# Patient Record
Sex: Female | Born: 1965 | Race: White | Hispanic: No | Marital: Married | Smoking: Never smoker
Health system: Southern US, Community
[De-identification: ages and names within clinical notes are randomized; demographics above are authoritative.]

## PROBLEM LIST (undated history)

## (undated) DIAGNOSIS — F329 Major depressive disorder, single episode, unspecified: Secondary | ICD-10-CM

## (undated) DIAGNOSIS — F32A Depression, unspecified: Secondary | ICD-10-CM

## (undated) DIAGNOSIS — I480 Paroxysmal atrial fibrillation: Secondary | ICD-10-CM

## (undated) DIAGNOSIS — G43909 Migraine, unspecified, not intractable, without status migrainosus: Secondary | ICD-10-CM

## (undated) DIAGNOSIS — Z1371 Encounter for nonprocreative screening for genetic disease carrier status: Secondary | ICD-10-CM

## (undated) DIAGNOSIS — B009 Herpesviral infection, unspecified: Secondary | ICD-10-CM

## (undated) DIAGNOSIS — G4700 Insomnia, unspecified: Secondary | ICD-10-CM

## (undated) DIAGNOSIS — N879 Dysplasia of cervix uteri, unspecified: Secondary | ICD-10-CM

## (undated) DIAGNOSIS — F419 Anxiety disorder, unspecified: Secondary | ICD-10-CM

## (undated) DIAGNOSIS — S0300XA Dislocation of jaw, unspecified side, initial encounter: Secondary | ICD-10-CM

## (undated) DIAGNOSIS — E221 Hyperprolactinemia: Secondary | ICD-10-CM

## (undated) HISTORY — DX: Paroxysmal atrial fibrillation: I48.0

## (undated) HISTORY — DX: Hyperprolactinemia: E22.1

## (undated) HISTORY — DX: Dysplasia of cervix uteri, unspecified: N87.9

## (undated) HISTORY — PX: CERVIX LESION DESTRUCTION: SHX591

## (undated) HISTORY — PX: TRIGGER FINGER RELEASE: SHX641

## (undated) HISTORY — PX: EYE SURGERY: SHX253

## (undated) HISTORY — DX: Major depressive disorder, single episode, unspecified: F32.9

## (undated) HISTORY — DX: Insomnia, unspecified: G47.00

## (undated) HISTORY — DX: Encounter for nonprocreative screening for genetic disease carrier status: Z13.71

## (undated) HISTORY — PX: ORIF CLAVICLE FRACTURE: SUR924

## (undated) HISTORY — PX: CLAVICLE HARDWARE REMOVAL: SHX1351

## (undated) HISTORY — DX: Herpesviral infection, unspecified: B00.9

## (undated) HISTORY — DX: Depression, unspecified: F32.A

## (undated) HISTORY — PX: BREAST BIOPSY: SHX20

## (undated) HISTORY — PX: OTHER SURGICAL HISTORY: SHX169

## (undated) HISTORY — DX: Anxiety disorder, unspecified: F41.9

## (undated) HISTORY — PX: REFRACTIVE SURGERY: SHX103

## (undated) HISTORY — PX: FRACTURE SURGERY: SHX138

## (undated) HISTORY — DX: Dislocation of jaw, unspecified side, initial encounter: S03.00XA

---

## 1996-02-02 HISTORY — PX: AUGMENTATION MAMMAPLASTY: SUR837

## 1998-01-10 ENCOUNTER — Other Ambulatory Visit: Admission: RE | Admit: 1998-01-10 | Discharge: 1998-01-10 | Payer: Self-pay | Admitting: Gynecology

## 1998-02-01 HISTORY — PX: SHOULDER ARTHROSCOPY WITH ROTATOR CUFF REPAIR: SHX5685

## 1998-09-28 ENCOUNTER — Emergency Department (HOSPITAL_COMMUNITY): Admission: EM | Admit: 1998-09-28 | Discharge: 1998-09-28 | Payer: Self-pay

## 1999-02-20 ENCOUNTER — Other Ambulatory Visit: Admission: RE | Admit: 1999-02-20 | Discharge: 1999-03-05 | Payer: Self-pay | Admitting: Internal Medicine

## 1999-08-20 ENCOUNTER — Encounter: Payer: Self-pay | Admitting: Gynecology

## 1999-08-20 ENCOUNTER — Encounter: Admission: RE | Admit: 1999-08-20 | Discharge: 1999-08-20 | Payer: Self-pay | Admitting: Gynecology

## 1999-09-24 ENCOUNTER — Encounter (INDEPENDENT_AMBULATORY_CARE_PROVIDER_SITE_OTHER): Payer: Self-pay | Admitting: Specialist

## 1999-09-24 ENCOUNTER — Ambulatory Visit (HOSPITAL_COMMUNITY): Admission: RE | Admit: 1999-09-24 | Discharge: 1999-09-24 | Payer: Self-pay | Admitting: General Surgery

## 2000-03-18 ENCOUNTER — Other Ambulatory Visit: Admission: RE | Admit: 2000-03-18 | Discharge: 2000-03-18 | Payer: Self-pay | Admitting: Gynecology

## 2000-08-16 ENCOUNTER — Encounter: Payer: Self-pay | Admitting: *Deleted

## 2000-08-16 ENCOUNTER — Ambulatory Visit (HOSPITAL_COMMUNITY): Admission: RE | Admit: 2000-08-16 | Discharge: 2000-08-16 | Payer: Self-pay | Admitting: *Deleted

## 2000-11-11 ENCOUNTER — Inpatient Hospital Stay (HOSPITAL_COMMUNITY): Admission: AD | Admit: 2000-11-11 | Discharge: 2000-11-11 | Payer: Self-pay | Admitting: Gynecology

## 2000-11-11 ENCOUNTER — Encounter: Payer: Self-pay | Admitting: Gynecology

## 2000-11-22 ENCOUNTER — Inpatient Hospital Stay (HOSPITAL_COMMUNITY): Admission: AD | Admit: 2000-11-22 | Discharge: 2000-11-24 | Payer: Self-pay | Admitting: Gynecology

## 2001-01-03 ENCOUNTER — Other Ambulatory Visit: Admission: RE | Admit: 2001-01-03 | Discharge: 2001-01-03 | Payer: Self-pay | Admitting: Gynecology

## 2001-10-12 ENCOUNTER — Encounter: Payer: Self-pay | Admitting: Gynecology

## 2001-10-12 ENCOUNTER — Encounter: Admission: RE | Admit: 2001-10-12 | Discharge: 2001-10-12 | Payer: Self-pay | Admitting: Gynecology

## 2001-11-10 ENCOUNTER — Encounter (INDEPENDENT_AMBULATORY_CARE_PROVIDER_SITE_OTHER): Payer: Self-pay | Admitting: Specialist

## 2001-11-10 ENCOUNTER — Ambulatory Visit (HOSPITAL_COMMUNITY): Admission: RE | Admit: 2001-11-10 | Discharge: 2001-11-10 | Payer: Self-pay | Admitting: General Surgery

## 2002-03-23 ENCOUNTER — Other Ambulatory Visit: Admission: RE | Admit: 2002-03-23 | Discharge: 2002-03-23 | Payer: Self-pay | Admitting: Gynecology

## 2002-11-30 ENCOUNTER — Encounter: Admission: RE | Admit: 2002-11-30 | Discharge: 2002-11-30 | Payer: Self-pay | Admitting: Gynecology

## 2003-04-09 ENCOUNTER — Emergency Department (HOSPITAL_COMMUNITY): Admission: EM | Admit: 2003-04-09 | Discharge: 2003-04-09 | Payer: Self-pay | Admitting: Emergency Medicine

## 2003-06-14 ENCOUNTER — Other Ambulatory Visit: Admission: RE | Admit: 2003-06-14 | Discharge: 2003-06-14 | Payer: Self-pay | Admitting: Gynecology

## 2003-12-06 ENCOUNTER — Encounter: Admission: RE | Admit: 2003-12-06 | Discharge: 2003-12-06 | Payer: Self-pay | Admitting: Gynecology

## 2004-05-01 ENCOUNTER — Other Ambulatory Visit: Admission: RE | Admit: 2004-05-01 | Discharge: 2004-05-01 | Payer: Self-pay | Admitting: Gynecology

## 2004-07-11 ENCOUNTER — Inpatient Hospital Stay (HOSPITAL_COMMUNITY): Admission: AD | Admit: 2004-07-11 | Discharge: 2004-07-11 | Payer: Self-pay | Admitting: Gynecology

## 2004-08-15 ENCOUNTER — Inpatient Hospital Stay (HOSPITAL_COMMUNITY): Admission: AD | Admit: 2004-08-15 | Discharge: 2004-08-15 | Payer: Self-pay | Admitting: Gynecology

## 2004-09-04 ENCOUNTER — Inpatient Hospital Stay (HOSPITAL_COMMUNITY): Admission: AD | Admit: 2004-09-04 | Discharge: 2004-09-05 | Payer: Self-pay | Admitting: Gynecology

## 2004-09-11 ENCOUNTER — Inpatient Hospital Stay (HOSPITAL_COMMUNITY): Admission: AD | Admit: 2004-09-11 | Discharge: 2004-09-11 | Payer: Self-pay | Admitting: Gynecology

## 2004-10-23 ENCOUNTER — Inpatient Hospital Stay (HOSPITAL_COMMUNITY): Admission: AD | Admit: 2004-10-23 | Discharge: 2004-10-23 | Payer: Self-pay | Admitting: Gynecology

## 2004-10-29 ENCOUNTER — Inpatient Hospital Stay (HOSPITAL_COMMUNITY): Admission: AD | Admit: 2004-10-29 | Discharge: 2004-10-31 | Payer: Self-pay | Admitting: Gynecology

## 2004-10-29 ENCOUNTER — Encounter (INDEPENDENT_AMBULATORY_CARE_PROVIDER_SITE_OTHER): Payer: Self-pay | Admitting: Specialist

## 2004-11-26 ENCOUNTER — Other Ambulatory Visit: Admission: RE | Admit: 2004-11-26 | Discharge: 2004-11-26 | Payer: Self-pay | Admitting: Gynecology

## 2005-06-18 ENCOUNTER — Encounter: Admission: RE | Admit: 2005-06-18 | Discharge: 2005-06-18 | Payer: Self-pay | Admitting: Gynecology

## 2005-11-08 ENCOUNTER — Ambulatory Visit: Payer: Self-pay | Admitting: Oncology

## 2005-12-03 ENCOUNTER — Other Ambulatory Visit: Admission: RE | Admit: 2005-12-03 | Discharge: 2005-12-03 | Payer: Self-pay | Admitting: Gynecology

## 2006-02-04 ENCOUNTER — Ambulatory Visit: Payer: Self-pay | Admitting: Oncology

## 2006-03-28 ENCOUNTER — Encounter: Payer: Self-pay | Admitting: Internal Medicine

## 2006-07-15 ENCOUNTER — Encounter: Admission: RE | Admit: 2006-07-15 | Discharge: 2006-07-15 | Payer: Self-pay | Admitting: Gynecology

## 2006-09-16 ENCOUNTER — Ambulatory Visit: Payer: Self-pay | Admitting: Licensed Clinical Social Worker

## 2006-09-30 ENCOUNTER — Ambulatory Visit: Payer: Self-pay | Admitting: Licensed Clinical Social Worker

## 2006-10-25 ENCOUNTER — Emergency Department (HOSPITAL_COMMUNITY): Admission: EM | Admit: 2006-10-25 | Discharge: 2006-10-25 | Payer: Self-pay | Admitting: *Deleted

## 2006-10-28 ENCOUNTER — Ambulatory Visit (HOSPITAL_COMMUNITY): Admission: RE | Admit: 2006-10-28 | Discharge: 2006-10-28 | Payer: Self-pay | Admitting: Orthopedic Surgery

## 2006-12-09 ENCOUNTER — Other Ambulatory Visit: Admission: RE | Admit: 2006-12-09 | Discharge: 2006-12-09 | Payer: Self-pay | Admitting: Gynecology

## 2007-09-01 ENCOUNTER — Encounter: Admission: RE | Admit: 2007-09-01 | Discharge: 2007-09-01 | Payer: Self-pay | Admitting: Gynecology

## 2007-09-27 ENCOUNTER — Encounter: Admission: RE | Admit: 2007-09-27 | Discharge: 2007-09-27 | Payer: Self-pay | Admitting: Gynecology

## 2007-11-10 ENCOUNTER — Ambulatory Visit: Payer: Self-pay | Admitting: Gynecology

## 2008-01-09 ENCOUNTER — Ambulatory Visit (HOSPITAL_COMMUNITY): Admission: RE | Admit: 2008-01-09 | Discharge: 2008-01-09 | Payer: Self-pay | Admitting: Gynecology

## 2008-01-09 ENCOUNTER — Ambulatory Visit: Payer: Self-pay | Admitting: Gynecology

## 2008-01-09 ENCOUNTER — Other Ambulatory Visit: Admission: RE | Admit: 2008-01-09 | Discharge: 2008-01-09 | Payer: Self-pay | Admitting: Gynecology

## 2008-01-09 ENCOUNTER — Encounter: Payer: Self-pay | Admitting: Gynecology

## 2008-02-01 ENCOUNTER — Ambulatory Visit: Payer: Self-pay | Admitting: Gynecology

## 2008-02-23 ENCOUNTER — Encounter: Admission: RE | Admit: 2008-02-23 | Discharge: 2008-02-23 | Payer: Self-pay | Admitting: Gastroenterology

## 2008-03-06 ENCOUNTER — Ambulatory Visit (HOSPITAL_COMMUNITY): Admission: RE | Admit: 2008-03-06 | Discharge: 2008-03-06 | Payer: Self-pay | Admitting: Gastroenterology

## 2008-09-19 ENCOUNTER — Encounter: Admission: RE | Admit: 2008-09-19 | Discharge: 2008-09-19 | Payer: Self-pay | Admitting: Gynecology

## 2008-11-08 ENCOUNTER — Encounter (INDEPENDENT_AMBULATORY_CARE_PROVIDER_SITE_OTHER): Payer: Self-pay | Admitting: *Deleted

## 2008-11-15 ENCOUNTER — Ambulatory Visit: Payer: Self-pay | Admitting: Family Medicine

## 2008-11-15 DIAGNOSIS — Z860100 Personal history of colon polyps, unspecified: Secondary | ICD-10-CM | POA: Insufficient documentation

## 2008-11-15 DIAGNOSIS — R5383 Other fatigue: Secondary | ICD-10-CM | POA: Insufficient documentation

## 2008-11-15 DIAGNOSIS — Z862 Personal history of diseases of the blood and blood-forming organs and certain disorders involving the immune mechanism: Secondary | ICD-10-CM | POA: Insufficient documentation

## 2008-11-15 DIAGNOSIS — F3289 Other specified depressive episodes: Secondary | ICD-10-CM | POA: Insufficient documentation

## 2008-11-15 DIAGNOSIS — R519 Headache, unspecified: Secondary | ICD-10-CM | POA: Insufficient documentation

## 2008-11-15 DIAGNOSIS — J309 Allergic rhinitis, unspecified: Secondary | ICD-10-CM | POA: Insufficient documentation

## 2008-11-15 DIAGNOSIS — R51 Headache: Secondary | ICD-10-CM | POA: Insufficient documentation

## 2008-11-15 DIAGNOSIS — R5381 Other malaise: Secondary | ICD-10-CM | POA: Insufficient documentation

## 2008-11-15 DIAGNOSIS — F329 Major depressive disorder, single episode, unspecified: Secondary | ICD-10-CM | POA: Insufficient documentation

## 2008-11-19 LAB — CONVERTED CEMR LAB
BUN: 10 mg/dL (ref 6–23)
Basophils Absolute: 0 10*3/uL (ref 0.0–0.1)
Basophils Relative: 0 % (ref 0–1)
CO2: 20 meq/L (ref 19–32)
Calcium: 9.6 mg/dL (ref 8.4–10.5)
Chloride: 105 meq/L (ref 96–112)
Creatinine, Ser: 0.9 mg/dL (ref 0.40–1.20)
Eosinophils Absolute: 0.1 10*3/uL (ref 0.0–0.7)
Eosinophils Relative: 2 % (ref 0–5)
Glucose, Bld: 85 mg/dL (ref 70–99)
HCT: 38.7 % (ref 36.0–46.0)
Hemoglobin: 12.7 g/dL (ref 12.0–15.0)
Lymphocytes Relative: 17 % (ref 12–46)
Lymphs Abs: 1.2 10*3/uL (ref 0.7–4.0)
MCHC: 32.8 g/dL (ref 30.0–36.0)
MCV: 91.5 fL (ref 78.0–100.0)
Monocytes Absolute: 0.7 10*3/uL (ref 0.1–1.0)
Monocytes Relative: 10 % (ref 3–12)
Neutro Abs: 5.3 10*3/uL (ref 1.7–7.7)
Neutrophils Relative %: 72 % (ref 43–77)
Platelets: 253 10*3/uL (ref 150–400)
Potassium: 4.4 meq/L (ref 3.5–5.3)
Prolactin: 21.4 ng/mL
RBC: 4.23 M/uL (ref 3.87–5.11)
RDW: 12.5 % (ref 11.5–15.5)
Sodium: 143 meq/L (ref 135–145)
TSH: 3.193 microintl units/mL (ref 0.350–4.500)
WBC: 7.4 10*3/uL (ref 4.0–10.5)

## 2008-11-29 ENCOUNTER — Encounter: Admission: RE | Admit: 2008-11-29 | Discharge: 2008-11-29 | Payer: Self-pay | Admitting: Family Medicine

## 2008-12-02 ENCOUNTER — Encounter (INDEPENDENT_AMBULATORY_CARE_PROVIDER_SITE_OTHER): Payer: Self-pay | Admitting: *Deleted

## 2008-12-02 ENCOUNTER — Telehealth (INDEPENDENT_AMBULATORY_CARE_PROVIDER_SITE_OTHER): Payer: Self-pay | Admitting: *Deleted

## 2009-02-01 HISTORY — PX: LAPAROSCOPIC CHOLECYSTECTOMY: SUR755

## 2009-04-25 ENCOUNTER — Ambulatory Visit: Payer: Self-pay | Admitting: Gynecology

## 2009-04-25 ENCOUNTER — Other Ambulatory Visit: Admission: RE | Admit: 2009-04-25 | Discharge: 2009-04-25 | Payer: Self-pay | Admitting: Gynecology

## 2009-10-31 ENCOUNTER — Encounter: Admission: RE | Admit: 2009-10-31 | Discharge: 2009-10-31 | Payer: Self-pay | Admitting: Gynecology

## 2010-03-05 NOTE — Letter (Signed)
Summary: New Patient Letter  [facility] [place]  [address] [place], [place] [address]   Phone: [phone]  Fax: [phone]       11/08/2008 MRN: [id number]  Elfreda LACHIUSA [address] [place], [place]  [address]  Dear Ms. LACHIUSA,   Welcome to [facility] and thank you for choosing [place] as your Primary Care Providers. Enclosed you will find information about our practice that we hope you find helpful. We have also enclosed forms to be filled out prior to your visit. This will provide [place] with the necessary information and facilitate your being seen in a timely manner. If you have any questions, please call us at:    [phone]      and we will be happy to assist you. We look forward to seeing you at your scheduled appointment time.  Appointment   OCTOBER [phone]            with Dr.    Beverely Low             Sincerely,  Primary Health Care Team  Please arrive 15 minutes early for your first appointment and bring your insurance card. Co-pay is required at the time of your visit.  *****Please call the office if you are not able to keep this appointment. There is a charge of $50.00 if any appointment is not cancelled or rescheduled within 24 hours.

## 2010-03-05 NOTE — Progress Notes (Signed)
  Phone Note Other Incoming   Caller: ALIDA Summary of Call: LEFT MSG FOR PT TO CALL RE: LAB  Initial call taken by: Kandice Hams,  December 02, 2008 9:42 AM  Follow-up for Phone Call        Pt called back informed of lab result, she is still having some nagging mild to moderate HA, within past week. Pt would like the referral to Dr Clarisse Gouge for management of HA, REFERRAL GIVEN  Follow-up by: Kandice Hams,  December 02, 2008 12:41 PM

## 2010-03-05 NOTE — Assessment & Plan Note (Signed)
Summary: NEW PT/AETNA/NS/KDC   Vital Signs:  Patient profile:   45 year old female Height:      63.75 inches Weight:      146.2 pounds BMI:     25.38 Pulse rate:   66 / minute BP sitting:   114 / 68  (right arm)  Vitals Entered By: Doristine Devoid (November 15, 2008 3:39 PM) CC: new est- frequent HA   History of Present Illness: 45 yo woman here today to establish care.  GYN- Lily Peer, [address]  1) HAs-  pt reports waking up w/ pounding HA over L eye for last 3-4 weeks.  no associated N/V, dizziness, visual changes.  hx of migraines- but not as severe as those.  pain described as a throbbing.  no radiation of pain, remains frontal.  no incontinence.  no focal weakness.  has hx of seasonal allergies- taking claritin as needed.  pt reports increased thirst, excessively dry skin- concerned about thyroid.  years ago had elevated prolactin level- was on Parlodel but never had a CT scan.  2) fatigue- pt reports feeling the need to sleep 'all the time'.  amount of sleep at night has not changed but pt feeling overwhelming fatigue.  has concerns about thyroid given dry skin and increased thirst.  no family hx of thyroid dysfxn.  3) allergies- seasonal, not currently on regular meds.  taking claritin as needed.  Preventive Screening-Counseling & Management  Alcohol-Tobacco     Alcohol drinks/day: <1     Smoking Status: never      Sexual History:  currently monogamous.        Drug Use:  never.    Current Medications (verified): 1)  Paroxetine Hcl 25 Mg Xr24h-Tab (Paroxetine Hcl) .... Take One Tablet Daily  Allergies (verified): 1)  ! Sulfa  Past History:  Past Medical History: Depression MIgraines Allergic rhinitis Colonic polyps, hx of  Past Surgical History: R collar bone pin replacement and removed R rotator cuff Breast augmentation Colonoscopy/endoscopy-2010- Cholecystectomy  Past History:  Care Management: Gastroenterology:Dr. [address]oreta [address] Gynecology:Dr. Lily Peer   Family History: CAD-father HTN-father,grandparents DM-no STROKE-grandparents COLON CA-no BREAST CA-mother DX: 31 deceased 34  Social History: married, 2 children (02, 06) dental hygeinist dog, 2 [place] pigsSmoking Status:  never Sexual History:  currently monogamous Drug Use:  never  Review of Systems General:  Complains of fatigue; denies chills, fever, loss of appetite, malaise, sleep disorder, sweats, weakness, and weight loss. Eyes:  Denies blurring, double vision, and itching. ENT:  Complains of nasal congestion and postnasal drainage. CV:  Complains of weight gain. GI:  Denies abdominal pain, nausea, and vomiting. Derm:  Complains of dryness; denies hair loss. Neuro:  Complains of headaches; denies numbness, poor balance, seizures, tingling, and tremors. Endo:  Complains of excessive thirst.  Physical Exam  General:  Well-developed,well-nourished,in no acute distress; alert,appropriate and cooperative throughout examination Head:  Normocephalic and atraumatic without obvious abnormalities. No apparent alopecia or balding. Eyes:  No corneal or conjunctival inflammation noted. EOMI. Perrla. Funduscopic exam benign, without hemorrhages, exudates or papilledema. Vision grossly normal. Ears:  External ear exam shows no significant lesions or deformities.  Otoscopic examination reveals clear canals, tympanic membranes are intact bilaterally without bulging, retraction, inflammation or discharge. Hearing is grossly normal bilaterally. Nose:  + congestion, mildly edematous turbinates Mouth:  + PND Neck:  No deformities, masses, or tenderness noted. Lungs:  Normal respiratory effort, chest expands symmetrically. Lungs are clear to auscultation, no crackles or wheezes. Heart:  Normal rate and regular  rhythm. S1 and S2 normal without gallop, murmur, click, rub or other extra sounds. Pulses:  +2 carotid, radial, DP Extremities:  no C/C/E Neurologic:  No cranial nerve deficits noted.  Station and gait are normal. Plantar reflexes are down-going bilaterally. DTRs are symmetrical throughout. Sensory, motor and coordinative functions appear intact.  (-) Romberg, tandem gait intact Skin:  Intact without suspicious lesions or rashes   Impression & Recommendations:  Problem # 1:  HEADACHE (ICD-784.0) Assessment New pt waking w/ daily HAs that improve as the day goes on.  hx of elevated prolactin level but has never had imaging.  diff dx includes pituitary adenoma, sinus HAs, untreated allergies, migraine variant.  will image head to assess pituitary and for other potential masses.  treat allergies on more regular basis.  reviewed supportive care and red flags that should prompt return.  Pt expresses understanding and is in agreement w/ this plan. Orders: Radiology Referral (Radiology)  Problem # 2:  FATIGUE (ICD-780.79) Assessment: New pt having combo of fatigue, dry skin, increased thirst.  normal thyroid on exam but will check labs. Orders: Venipuncture ([address]1478)  Problem # 3:  ALLERGIC RHINITIS (ICD-477.9) Assessment: New encouraged pt to take OTC antihistamine daily to see if HA sxs improve.  will follow.  Complete Medication List: 1)  Paroxetine Hcl 25 Mg Xr24h-tab (Paroxetine hcl) .... Take one tablet daily  Patient Instructions: 1)  Follow up with me in 2 weeks to f/u headaches 2)  We'll notify you of your lab results 3)  Someone will call you with your MRI appt 4)  Take Claritin or Zyrtec daily for allergies 5)  Tylenol/Ibuprofen as needed. 6)  If your symptoms suddenly worsen or you have any other questions or concerns- please call or go to the ER 7)  Hang in there!!

## 2010-03-05 NOTE — Letter (Signed)
Summary: Primary Care Consult Scheduled Letter  [facility] [place]  [address] [place], [place] [address]   Phone: [phone]  Fax: [phone]      12/02/2008 MRN: [id number]  Jenna Cortez [address] [place], [place]  [address]    Dear Ms. Cortez,    We have scheduled an appointment for you.  At the recommendation of Dr. Neena Rhymes, we have scheduled you a consult with Dr. Clarisse Gouge with [facility] on 12-20-08 arrive 11:00am.  Their address is [address], [address], [place] [address]. The office phone number is [phone].  If this appointment day and time is not convenient for you, please feel free to call the office of the doctor you are being referred to at the number listed above and reschedule the appointment.    It is important for you to keep your scheduled appointments. We are here to make sure you are given good patient care.   Thank you,    Renee, Patient Care Coordinator [facility] [place]    **YOU WILL NEED TO TAKE A COPY OF YOUR MRI ON CD TO THIS APPOINTMENT**

## 2010-03-05 NOTE — Letter (Signed)
Summary: No Show for New Patient Appointment  No Show for New Patient Appointment   Imported By: Maryln Gottron 07/07/2009 15:08:32  _____________________________________________________________________  External Attachment:    Type:   Image     Comment:   External Document

## 2010-05-01 ENCOUNTER — Encounter: Payer: Self-pay | Admitting: Gynecology

## 2010-05-18 ENCOUNTER — Encounter: Payer: Self-pay | Admitting: Gynecology

## 2010-06-05 ENCOUNTER — Encounter: Payer: Self-pay | Admitting: Gynecology
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
## 2010-06-19 ENCOUNTER — Encounter: Payer: Self-pay | Admitting: Gynecology

## 2010-06-19 NOTE — Op Note (Signed)
[facility]  Patient:    Jenna Cortez, Jenna Cortez                     MRN: [id number] Proc. Date: 09/24/99 Adm. Date:  [id number] Disc. Date: [id number] Attending:  Carson Myrtle                           Operative Report  PREOPERATIVE DIAGNOSES:  Mass right breast.  POSTOPERATIVE DIAGNOSES:  Mass right breast.  PROCEDURE:  Excision mass right breast.  SURGEON:  Kendrick Ranch, M.D.  ANESTHESIA:  Local standby.  INDICATIONS FOR PROCEDURE:  This young woman is 80, has a strong family history of breast cancer and though she has had implants this is a new mass in the right mass. It is very small in the upper outer quadrant of the right breast. She is very nervous and would prefer to have it removed and I agree.  DESCRIPTION OF PROCEDURE:  The patient was brought to the operating room, IV started, sedation given. I had localized the small palpable mass in the right breast upper outer quadrant. It is clearly outside the implant inside the skin. A 0.25% Marcaine with epinephrine was used for local anesthesia after the field was prepped and draped. A tiny incision was made. The mass was identified and grasped and drawn out of the field and sharply dissected with scissors away from the surrounding breast tissue. The capsule of the implant was never seen. There were no complications, no bleeding. The wound was closed with 5-0 monocryl. Steri-Strips applied. Counts correct. Specimen to the lab in formalin. The patient tolerated it well. Instructions given including Darvocet-N #24 and she will be followed as an outpatient. DD:  09/24/99 TD:  09/26/99 Job: [id number]

## 2010-06-19 NOTE — H&P (Signed)
[facility]  Patient:    Jenna Cortez, Jenna Cortez Visit Number: [id number] MRN: [id number]          Service Type: OBS Location: [facility] Attending Physician:  Tonye Royalty Dictated by:   Gaetano Hawthorne. Lily Peer, M.D. Admit Date:  11/11/2000 Discharge Date: 11/11/2000                           History and Physical  CHIEF COMPLAINT:              Bloody show and contractions and rupture of membranes.  HISTORY:                      The patient is a 45 year old gravida 1, para 0, with last menstrual period of February 27, 2000, estimated date of confinement December 03, 2000, currently 38-1/2 weeks estimated gestational age.  The patient presented to [facility] early this morning complaining of contractions which started yesterday and have intensified and also having a bloody show and questionable rupture of membranes.  She was found to be grossly ruptured, bloody show was evident, her cervix was fingertip and she has had a history of cryotherapy in the past and the cervix was dilated on examination with digitalization to approximately 3 cm, almost complete effacement and a -2 station.  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  There was a question whether back on October 14th she had an outbreak or not and her culture was negative and none reported since then.  ALLERGIES:                    SULFA and CODEINE.  At one time, she thought she was allergic to latex but that has been discarded.  PAST SURGICAL HISTORY:        She has had shoulder surgery in the past.  She has also had breast augmentation and right breast lump removed back in 2001.  PHYSICAL EXAMINATION:  VITAL SIGNS:                  Blood pressure 126/74, respirations 20, pulse 92, temperature 97.8.  HEENT:                         Unremarkable.  NECK:                         Supple.  Trachea midline.  No carotid bruits. No thyromegaly.  LUNGS:                        Clear to auscultation without rhonchi or wheezes.  HEART:                        Regular rate and rhythm.  No murmurs or gallops.  BREASTS:                      Exam not done.  ABDOMEN:                      Gravid uterus, vertex presentation by [employer].  PELVIC:  Cervix 3 cm, complete effacement, -2 station, gross rupture of membranes, bloody show.  EXTREMITIES:                  DTRs 1+.  Negative clonus.  Trace edema.  PRENATAL LABORATORY DATA:     Blood type O-positive, negative antibody screen. VDRL was nonreactive.  Rubella immune.  Hepatitis B surface antigen and HIV were negative.  Pap smear was normal.  Alpha-fetoprotein was normal.  Diabetes screen was normal.  GBS culture was negative.  ASSESSMENT:                   [age]-year-old gravida 1, para 0 at 38-1/2 weeks estimated gestational age with a bloody show, now advanced cervical dilatation, 3 cm, complete effacement, -2 station with gross rupture of membranes, contracting every 3 to 7 minutes, with a reassuring fetal heart rate tracing.  Group B streptococcus culture was negative.  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  PLAN:                         To admit patient to labor and delivery and augment with Pitocin in the event of protracted labor and have an epidural on board and then proceed with intrauterine pressure catheter and fetal scalp electrode.  Dictated by:   Gaetano Hawthorne Lily Peer, M.D. Attending Physician:  Tonye Royalty DD:  11/22/00 TD:  11/22/00 Job: [id number]

## 2010-06-19 NOTE — Discharge Summary (Signed)
[facility]  Patient:    Jenna Cortez, Jenna Cortez Visit Number: [id number] MRN: [id number]          Service Type: OBS Location: 910A 9129 01 Attending Physician:  Tonye Royalty Dictated by:   Antony Contras, [facility] Admit Date:  11/22/2000 Discharge Date: 11/24/2000                             Discharge Summary  DISCHARGE DIAGNOSES:          1. Intrauterine pregnancy at term.                               2. Spontaneous onset of labor.  PROCEDURES:                   Normal spontaneous vaginal delivery of viable infant over intact perineum.  HISTORY OF PRESENT ILLNESS:   The patient is a 45 year old primigravida with an LMP of February 27, 2000, [facility] of December 03, 2000.  Prenatal risk factors include a history of depression, HSV, and cryosurgery of the cervix.  PRENATAL LABORATORY DATA:     Blood type O positive, antibody screen negative. RPR, HBsAg, HIV nonreactive.  Rubella immune.  HOSPITAL COURSE:              The patient was admitted on November 22, 2000, with spontaneous onset of labor.  The cervix on admission was 3 cm, completely effaced, -2 station.  The patient also did experience rupture of membranes. Labor progressed to complete dilatation.  She delivered an Apgars of 9 and 30 female infant weighing 5 pounds 12 ounces.  She did experience superficial bilateral labial tears, which required no suturing.  Postpartum course:  The patient remained afebrile, had no difficulty voiding, and was able to be discharged in satisfactory condition on her second postpartum day.  CBC: Hematocrit 31.8, hemoglobin 11.5, WBC 15.2, platelets 222.  FOLLOW-UP:                    In six weeks.  MEDICATIONS:                  Continue with prenatal vitamins and iron. Motrin or Tylox for pain. Dictated by:   Antony Contras, [facility] Attending Physician:  Tonye Royalty DD:  12/09/00 TD:  12/11/00 Job: [id number]

## 2010-06-19 NOTE — Op Note (Signed)
   NAME:  Jenna Cortez, Jenna Cortez                      ACCOUNT NO.:  [id number]   MEDICAL RECORD NO.:  [id number]                   PATIENT TYPE:  AMB   LOCATION:  DAY                                  FACILITY:  [facility]   PHYSICIAN:  Timothy E. Earlene Plater, M.D.              DATE OF BIRTH:  [birth year]   DATE OF PROCEDURE:  11/10/2001  DATE OF DISCHARGE:                                 OPERATIVE REPORT   PREOPERATIVE DIAGNOSIS:  Left breast mass.   POSTOPERATIVE DIAGNOSIS:  Left breast mass.   PROCEDURE:  Excisional biopsy of mass, left breast.   SURGEON:  Timothy E. Earlene Plater, M.D.   ANESTHESIA:  Local standby.   INDICATION FOR PROCEDURE:  The patient is healthy, 2, gravida 1.  She has  had saline breast implants.  She has a strong family history with her mother  with breast cancer at a young age.  Because of a palpable, tender mass in  the left breast, she elects to proceed with excisional biopsy.  Mammography  is noncontributory.   DESCRIPTION OF PROCEDURE:  The patient was seen and evaluated by anesthesia.  The exact site of the palpable mass was marked by her and me.  She was taken  to the operating room, placed supine, and IV sedation given.  The left  breast was prepped and draped in the usual fashion.  Marcaine 0.25% with  epinephrine was used for local anesthesia.  A tiny incision was made over  the marked area of the left breast upper inner quadrant, the subcutaneous  tissue dissected, and the nodular tissue was removed.  This did not appear  to be a fibroadenoma and may have been fibroglandular tissue only.  I did  excise down to the pectoral fascia.  Bimanual exam revealed no other  palpable areas.  Cautery was used, and the wound was dry.  It was  approximated in layers with 4-0 Monocryl.  Steri-Strips applied.  Counts  correct.  She tolerated it well and was removed to the recovery room in good  condition.   Written and verbal instructions were given to the patient.  She will be  seen  and followed as an outpatient.                                               Timothy E. Earlene Plater, M.D.    TED/MEDQ  D:  11/10/2001  T:  11/10/2001  Job:  [id number]

## 2010-06-19 NOTE — Consult Note (Signed)
[facility]  Patient:    Jenna Cortez, CROOK Visit Number: [id number] MRN: [id number]          Service Type: OBS Location: [facility] Attending Physician:  Tonye Royalty Dictated by:   Gaetano Hawthorne. Lily Peer, M.D. Admit Date:  11/11/2000                            Consultation Report  HISTORY OF PRESENT ILLNESS:   The patient is a 45 year old gravida 1, para 0 at 36-1/2 weeks estimated gestational age, who presented to the emergency room today with complaints of vaginal bleeding.  The patient was seen in the office earlier today and had bleeding and she had spontaneous rupture of membranes and was Nitrazine and fern negative.  Her cervix was somewhat friable and a wet prep was done with a speculum.  Some bleeding had occurred and pressure was applied and stopped on the ectocervix, and she was sent home with instructions, she called and I instructed her to come to the emergency room tonight for evaluation.  She had an ultrasound which demonstrated the fetus to be in a vertex presentation, AFI was 12.1, placenta was intact with no evidence of abruption.  A sterile speculum exam demonstrated just old blood in the vaginal vault and the cervix was observed and there was no active bleeding coming from the os even after a gauze was left in place.  Her cervix was fingertip, 50-70% effaced and ballotable.  A fetal heart rate tracing demonstrated a reactive pattern with no evidence of contractions.  Her vital signs were 139/44, pulse 97, respiration 20, temperature 97.8.  Her blood type is O positive.  DISCHARGE INSTRUCTIONS:  She was given instructions that with any further bleeding or contractions to return to the emergency room or not she is scheduled to be seen in the office next week. Dictated by:   Gaetano Hawthorne Lily Peer, M.D. Attending Physician:  Tonye Royalty DD:  11/11/00 TD:  11/12/00 Job: [id number]

## 2010-07-17 ENCOUNTER — Other Ambulatory Visit (HOSPITAL_COMMUNITY)
Admission: RE | Admit: 2010-07-17 | Discharge: 2010-07-17 | Disposition: A | Discharge status: Home / Self Care | Payer: Managed Care, Other (non HMO) | Source: Ambulatory Visit | Attending: Gynecology | Admitting: Gynecology

## 2010-07-17 ENCOUNTER — Other Ambulatory Visit: Payer: Self-pay | Admitting: Gynecology

## 2010-07-17 ENCOUNTER — Encounter (INDEPENDENT_AMBULATORY_CARE_PROVIDER_SITE_OTHER): Payer: Managed Care, Other (non HMO) | Admitting: Gynecology

## 2010-07-17 DIAGNOSIS — Z13220 Encounter for screening for lipoid disorders: Secondary | ICD-10-CM

## 2010-07-17 DIAGNOSIS — Z01419 Encounter for gynecological examination (general) (routine) without abnormal findings: Secondary | ICD-10-CM

## 2010-07-17 DIAGNOSIS — F329 Major depressive disorder, single episode, unspecified: Secondary | ICD-10-CM

## 2010-07-17 DIAGNOSIS — Z124 Encounter for screening for malignant neoplasm of cervix: Secondary | ICD-10-CM | POA: Insufficient documentation

## 2010-07-17 DIAGNOSIS — Z833 Family history of diabetes mellitus: Secondary | ICD-10-CM

## 2010-07-17 DIAGNOSIS — R634 Abnormal weight loss: Secondary | ICD-10-CM

## 2010-07-17 DIAGNOSIS — F3289 Other specified depressive episodes: Secondary | ICD-10-CM

## 2010-10-12 ENCOUNTER — Encounter: Payer: Self-pay | Admitting: Women's Health

## 2010-10-12 ENCOUNTER — Ambulatory Visit (INDEPENDENT_AMBULATORY_CARE_PROVIDER_SITE_OTHER): Payer: Managed Care, Other (non HMO) | Admitting: Women's Health

## 2010-10-12 DIAGNOSIS — R5383 Other fatigue: Secondary | ICD-10-CM

## 2010-10-12 DIAGNOSIS — N925 Other specified irregular menstruation: Secondary | ICD-10-CM

## 2010-10-12 DIAGNOSIS — Z113 Encounter for screening for infections with a predominantly sexual mode of transmission: Secondary | ICD-10-CM

## 2010-10-12 DIAGNOSIS — B373 Candidiasis of vulva and vagina: Secondary | ICD-10-CM

## 2010-10-12 DIAGNOSIS — N938 Other specified abnormal uterine and vaginal bleeding: Secondary | ICD-10-CM

## 2010-10-12 DIAGNOSIS — N949 Unspecified condition associated with female genital organs and menstrual cycle: Secondary | ICD-10-CM

## 2010-10-12 DIAGNOSIS — B3731 Acute candidiasis of vulva and vagina: Secondary | ICD-10-CM

## 2010-10-12 DIAGNOSIS — R5381 Other malaise: Secondary | ICD-10-CM

## 2010-10-12 MED ORDER — FLUCONAZOLE 150 MG PO TABS: 150.0000 mg | ORAL_TABLET | Freq: Once | ORAL | Status: AC

## 2010-10-12 NOTE — Progress Notes (Signed)
  Presents with a complaint of spotting after intercourse for about a month she does have a new partner. She is on her cycle, it is at normal time. She is separated from her husband who had been unfaithful. She's using condoms for contraception. Denies any abdominal pain, discharge with odor or fever. Abdomen is soft nontender external genitalia is within normal limits, speculum exam moderate amount of menses type blood was noted, wet prep is positive for yeast, GC Chlamydia culture was taken and is pending. Bimanual no CMT no adnexal fullness or tenderness.  DUB unknown origin  Will check a call, TSH, GC Chlamydia, HIV, hep B and C., RPR. Contraception was reviewed, she think she would like to try IUD. Did review slight risk for perforation, hemorrhage, infection. Will have Dr. Lily Peer place with a period. Did review if the spotting would continue will do a sonohysterogram with biopsy. Will treat yeast with Diflucan 150 by mouth x1 dose. Continue condoms, plan B emergency contraception was reviewed.

## 2010-10-13 LAB — HIV ANTIBODY (ROUTINE TESTING W REFLEX): HIV: NONREACTIVE

## 2010-10-13 LAB — HEPATITIS C ANTIBODY: HCV Ab: NEGATIVE

## 2010-10-13 LAB — RPR

## 2010-10-13 LAB — HEPATITIS B SURFACE ANTIGEN: Hepatitis B Surface Ag: NEGATIVE

## 2010-11-12 LAB — CBC
HCT: 37.3
Hemoglobin: 12.4
MCHC: 33.4
MCV: 91.2
Platelets: 255
RBC: 4.09
RDW: 12.6
WBC: 7.3

## 2010-11-12 LAB — HCG, SERUM, QUALITATIVE: Preg, Serum: NEGATIVE

## 2010-11-17 ENCOUNTER — Other Ambulatory Visit: Payer: Self-pay | Admitting: Gynecology

## 2010-11-17 DIAGNOSIS — Z1231 Encounter for screening mammogram for malignant neoplasm of breast: Secondary | ICD-10-CM

## 2010-12-11 ENCOUNTER — Ambulatory Visit: Payer: Managed Care, Other (non HMO)

## 2010-12-25 ENCOUNTER — Ambulatory Visit: Payer: Managed Care, Other (non HMO)

## 2011-01-22 ENCOUNTER — Ambulatory Visit: Payer: Managed Care, Other (non HMO)

## 2011-01-29 ENCOUNTER — Ambulatory Visit: Payer: Managed Care, Other (non HMO)

## 2011-03-03 ENCOUNTER — Other Ambulatory Visit: Payer: Self-pay | Admitting: *Deleted

## 2011-03-03 DIAGNOSIS — F419 Anxiety disorder, unspecified: Secondary | ICD-10-CM

## 2011-03-03 MED ORDER — ALPRAZOLAM 0.5 MG PO TABS: ORAL_TABLET | ORAL | Status: DC

## 2011-03-03 NOTE — Telephone Encounter (Signed)
rx called in

## 2011-03-11 ENCOUNTER — Ambulatory Visit
Admission: RE | Admit: 2011-03-11 | Discharge: 2011-03-11 | Disposition: A | Discharge status: Home / Self Care | Payer: Managed Care, Other (non HMO) | Source: Ambulatory Visit | Attending: Gynecology | Admitting: Gynecology

## 2011-03-11 DIAGNOSIS — Z1231 Encounter for screening mammogram for malignant neoplasm of breast: Secondary | ICD-10-CM

## 2011-03-12 ENCOUNTER — Ambulatory Visit: Payer: Managed Care, Other (non HMO)

## 2011-03-24 ENCOUNTER — Other Ambulatory Visit: Payer: Self-pay

## 2011-03-24 MED ORDER — ALPRAZOLAM 0.5 MG PO TABS: ORAL_TABLET | ORAL | Status: DC

## 2011-03-24 NOTE — Telephone Encounter (Signed)
HARD COPY CHART WILL BE IN YOUR BOX.

## 2011-03-24 NOTE — Telephone Encounter (Signed)
Rx phoned into phamacy.

## 2011-03-24 NOTE — Telephone Encounter (Signed)
Patient to be reminded she needs her annual exam in June of this year.

## 2011-03-31 ENCOUNTER — Encounter (HOSPITAL_COMMUNITY): Payer: Self-pay | Admitting: Emergency Medicine

## 2011-03-31 ENCOUNTER — Inpatient Hospital Stay (HOSPITAL_COMMUNITY)
Admission: EM | Admit: 2011-03-31 | Discharge: 2011-04-01 | DRG: 309 | Disposition: A | Discharge status: Home / Self Care | Payer: Managed Care, Other (non HMO) | Attending: Internal Medicine | Admitting: Internal Medicine

## 2011-03-31 ENCOUNTER — Emergency Department (HOSPITAL_COMMUNITY): Payer: Managed Care, Other (non HMO)

## 2011-03-31 DIAGNOSIS — F329 Major depressive disorder, single episode, unspecified: Secondary | ICD-10-CM | POA: Diagnosis present

## 2011-03-31 DIAGNOSIS — I4891 Unspecified atrial fibrillation: Secondary | ICD-10-CM

## 2011-03-31 DIAGNOSIS — E876 Hypokalemia: Secondary | ICD-10-CM | POA: Diagnosis not present

## 2011-03-31 DIAGNOSIS — E229 Hyperfunction of pituitary gland, unspecified: Secondary | ICD-10-CM | POA: Diagnosis present

## 2011-03-31 DIAGNOSIS — F3289 Other specified depressive episodes: Secondary | ICD-10-CM | POA: Diagnosis present

## 2011-03-31 DIAGNOSIS — M069 Rheumatoid arthritis, unspecified: Secondary | ICD-10-CM | POA: Diagnosis present

## 2011-03-31 DIAGNOSIS — B009 Herpesviral infection, unspecified: Secondary | ICD-10-CM | POA: Diagnosis present

## 2011-03-31 DIAGNOSIS — M2669 Other specified disorders of temporomandibular joint: Secondary | ICD-10-CM | POA: Diagnosis present

## 2011-03-31 LAB — DIFFERENTIAL
Basophils Absolute: 0 10*3/uL (ref 0.0–0.1)
Basophils Relative: 0 % (ref 0–1)
Eosinophils Absolute: 0.1 10*3/uL (ref 0.0–0.7)
Eosinophils Relative: 2 % (ref 0–5)
Lymphocytes Relative: 26 % (ref 12–46)
Lymphs Abs: 2 10*3/uL (ref 0.7–4.0)
Monocytes Absolute: 0.8 10*3/uL (ref 0.1–1.0)
Monocytes Relative: 10 % (ref 3–12)
Neutro Abs: 4.9 10*3/uL (ref 1.7–7.7)
Neutrophils Relative %: 63 % (ref 43–77)

## 2011-03-31 LAB — CBC
HCT: 37.8 % (ref 36.0–46.0)
Hemoglobin: 13.2 g/dL (ref 12.0–15.0)
MCH: 30.8 pg (ref 26.0–34.0)
MCHC: 34.9 g/dL (ref 30.0–36.0)
MCV: 88.1 fL (ref 78.0–100.0)
Platelets: 283 10*3/uL (ref 150–400)
RBC: 4.29 MIL/uL (ref 3.87–5.11)
RDW: 12.4 % (ref 11.5–15.5)
WBC: 7.8 10*3/uL (ref 4.0–10.5)

## 2011-03-31 LAB — POCT PREGNANCY, URINE: Preg Test, Ur: NEGATIVE

## 2011-03-31 LAB — BASIC METABOLIC PANEL
BUN: 14 mg/dL (ref 6–23)
CO2: 22 mEq/L (ref 19–32)
Calcium: 9.7 mg/dL (ref 8.4–10.5)
Chloride: 106 mEq/L (ref 96–112)
Creatinine, Ser: 0.82 mg/dL (ref 0.50–1.10)
GFR calc Af Amer: >90 mL/min (ref >90)
GFR calc non Af Amer: 86 mL/min — ABNORMAL LOW (ref >90)
Glucose, Bld: 85 mg/dL (ref 70–99)
Potassium: 3.4 mEq/L — ABNORMAL LOW (ref 3.5–5.1)
Sodium: 138 mEq/L (ref 135–145)

## 2011-03-31 MED ORDER — SODIUM CHLORIDE 0.9 % IV BOLUS (SEPSIS)
500.0000 mL | Freq: Once | INTRAVENOUS | Status: AC
  Administered 2011-03-31: 20:00:00 via INTRAVENOUS

## 2011-03-31 MED ORDER — DILTIAZEM HCL 100 MG IV SOLR
5.0000 mg/h | INTRAVENOUS | Status: AC
  Administered 2011-03-31: 5 mg/h via INTRAVENOUS
  Filled 2011-03-31: qty 100

## 2011-03-31 MED ORDER — ASPIRIN 81 MG PO CHEW: 324.0000 mg | CHEWABLE_TABLET | Freq: Once | ORAL | Status: DC

## 2011-03-31 MED ORDER — FLECAINIDE ACETATE 100 MG PO TABS
200.0000 mg | ORAL_TABLET | Freq: Once | ORAL | Status: AC
  Administered 2011-04-01: 200 mg via ORAL
  Filled 2011-03-31: qty 2

## 2011-03-31 MED ORDER — ENOXAPARIN SODIUM 60 MG/0.6ML ~~LOC~~ SOLN
60.0000 mg | SUBCUTANEOUS | Status: AC
  Administered 2011-03-31: 60 mg via SUBCUTANEOUS
  Filled 2011-03-31: qty 0.6

## 2011-03-31 NOTE — H&P (Addendum)
Cardiology H&P  Primary Care Povider: Neena Rhymes, MD, MD Primary Cardiologist: none   HPI: Jenna Cortez is a 46 y.o.female with no prior cardiac history who presents to the ED tonight with palpitations that began at 430pm today.  Before this she had not had any symptoms.  She described a feeling of SOB and chest discomfort.  She felt her pulse and found it to be rapid and irregular.  She has had prior brief episodes of palpitations over the past few years but they lasted only seconds.  This has been constant for the last 6 hours.  She continues to have some symptoms now even though her rate is controlled.  She has had no other symptoms of chest pain, syncope or near syncope.  She has a brother who also has pAF.  She drinks 2 cups of coffee per day and only rarely drinks ETOH.  She does not have CAD, DM, or h/o prior CVA. She does not take any medications.   Past Medical History  Diagnosis Date  . TMJ (dislocation of temporomandibular joint)   . Insomnia   . Depression   . Herpes     HERPES SIMPLEX VIRUS  . Dysplasia of cervix, unspecified     HISTORY OF CERVICAL DYSPLASIA  . Hyperprolactinemia   . BRCA1 negative     NEGATIVE MUTATION 01/05/06  . BRCA2 negative     NEGATIVE MUTATION 01/05/06  . History of cholecystectomy     Past Surgical History  Procedure Date  . Shoulder surgery     RIGHT ROTATOR CUFF  2000  . Chest surgery     RIGHT CLAVICLE FRACTURE-TRAUMATIC  . Cryotherapy     CERVIX  . Breast surgery     RIGHT BREAST BIOPSY 2001-PAPILOMA/LT BR BXY-BENIGN 2003  . Augmentation mammaplasty     AUGMENTATION 1998    Family History  Problem Relation Age of Onset  . Breast cancer Mother   . Hypertension Father   . Atrial fibrillation Brother     Social History:  reports that she has never smoked. She has never used smokeless tobacco. She reports that she drinks alcohol. She reports that she does not use illicit drugs.  Allergies:  Allergies  Allergen Reactions    . Codeine Itching  . Sulfonamide Derivatives Rash    Current Facility-Administered Medications  Medication Dose Route Frequency Provider Last Rate Last Dose  . aspirin chewable tablet 324 mg  324 mg Oral Once [employer], PA      . diltiazem (CARDIZEM) 100 mg in dextrose 5 % 100 mL infusion  5 mg/hr Intravenous To Major Jenness Corner, PA 5 mL/hr at 03/31/11 2007 5 mg/hr at 03/31/11 2007  . enoxaparin (LOVENOX) injection 60 mg  60 mg Subcutaneous To Major Raeford Razor, MD      . sodium chloride 0.9 % bolus 500 mL  500 mL Intravenous Once Jenness Corner, PA       Current Outpatient Prescriptions  Medication Sig Dispense Refill  . naproxen sodium (ANAPROX) 220 MG tablet Take 220 mg by mouth daily as needed. For head ache        ROS: A full review of systems is obtained and is negative except as noted in the HPI.  Physical Exam: Blood pressure 97/56, pulse 89, temperature 98.5 F (36.9 C), resp. rate 17, last menstrual period 03/23/2011, SpO2 100.00%.  GENERAL: no acute distress.  EYES: Extra ocular movements are intact. There is no lid lag. Sclera is anicteric.  ENT: Oropharynx is clear. Dentition is within normal limits.  NECK: Supple. The thyroid is not enlarged.  LYMPH: There are no masses or lymphadenopathy present.  HEART: irregular without m/r/g, no JVD LUNGS: Clear to auscultation There are no rales, rhonchi, or wheezes.  ABDOMEN: Soft, non-tender, and non-distended with normoactive bowel sounds. There is no hepatosplenomegaly.  EXTREMITIES: No clubbing, cyanosis, or edema.  PULSES:  Femoral pulses were +2 and equal bilaterally. DP/PT pulses were +2 and equal bilaterally.  SKIN: Warm, dry, and intact.  NEUROLOGIC: The patient was oriented to person, place, and time. No overt neurologic deficits were detected.  PSYCH: Normal judgment and insight, mood is appropriate.   Results: Results for orders placed during the hospital encounter of 03/31/11 (from the past 24 hour(s))   CBC     Status: Normal   Collection Time   03/31/11  7:47 PM      Component Value Range   WBC 7.8  4.0 - 10.5 (K/uL)   RBC 4.29  3.87 - 5.11 (MIL/uL)   Hemoglobin 13.2  12.0 - 15.0 (g/dL)   HCT 82.9  56.2 - 13.0 (%)   MCV 88.1  78.0 - 100.0 (fL)   MCH 30.8  26.0 - 34.0 (pg)   MCHC 34.9  30.0 - 36.0 (g/dL)   RDW 86.5  78.4 - 69.6 (%)   Platelets 283  150 - 400 (K/uL)  DIFFERENTIAL     Status: Normal   Collection Time   03/31/11  7:47 PM      Component Value Range   Neutrophils Relative 63  43 - 77 (%)   Neutro Abs 4.9  1.7 - 7.7 (K/uL)   Lymphocytes Relative 26  12 - 46 (%)   Lymphs Abs 2.0  0.7 - 4.0 (K/uL)   Monocytes Relative 10  3 - 12 (%)   Monocytes Absolute 0.8  0.1 - 1.0 (K/uL)   Eosinophils Relative 2  0 - 5 (%)   Eosinophils Absolute 0.1  0.0 - 0.7 (K/uL)   Basophils Relative 0  0 - 1 (%)   Basophils Absolute 0.0  0.0 - 0.1 (K/uL)  BASIC METABOLIC PANEL     Status: Abnormal   Collection Time   03/31/11  7:47 PM      Component Value Range   Sodium 138  135 - 145 (mEq/L)   Potassium 3.4 (*) 3.5 - 5.1 (mEq/L)   Chloride 106  96 - 112 (mEq/L)   CO2 22  19 - 32 (mEq/L)   Glucose, Bld 85  70 - 99 (mg/dL)   BUN 14  6 - 23 (mg/dL)   Creatinine, Ser 2.95  0.50 - 1.10 (mg/dL)   Calcium 9.7  8.4 - 28.4 (mg/dL)   GFR calc non Af Amer 86 (*) >90 (mL/min)   GFR calc Af Amer >90  >90 (mL/min)    EKG: Afib with RVR, no ischemic changes CXR: clear Preg test: pending, last menstrual period   Assessment/Plan: 46 yo WF with FH of Afib here with new onset AF with RVR.  Symptoms began 5-6 hours ago.  Her CHA2DS2-VASc score is only 1 (female).  Rate is improved on diltiazem ggt. Given onset of symptoms just a few hours ago and low stroke risk would be reasonable to attempt cardioversion with 200mg  dose of flecainide - urine preg test  - lovenox 1mg /kg SQ now and q12 - flecainide 200mg  PO x 1 - continue diltiazem ggt for now - check TSH - check echo in  AM - will monitor in tele  bed overnight.  If flecainide works could plan to send home with pill-in-pocket, if not will tentatively plan for DCCV.   Lyne Khurana 03/31/2011, 10:05 PM

## 2011-03-31 NOTE — ED Provider Notes (Signed)
Medical screening examination/treatment/procedure(s) were conducted as a shared visit with non-physician practitioner(s) and myself.  I personally evaluated the patient during the encounter.  46 year old female with palpitations. Patient has new onset atrial fibrillation. Rate controlled with Cardizem, but remains in A. fib. No diagnosed hx of DM, HTN, CHF, valvular dz, thromboembolism. Discussed case with Dr. Charm Barges, cardiology. Given onset of symptoms today and lack of significant risk factors, he'd like to cardiovert patient. Dose of Lovenox was ordered per his recommendations. Will evaluate the patient in emergency room.  Raeford Razor, MD 03/31/11 2119

## 2011-03-31 NOTE — ED Notes (Signed)
Pt to ED via EMS with c/o new onset palpitations.  Pt states experienced burning in left chest radiating to back.

## 2011-03-31 NOTE — ED Provider Notes (Signed)
History     CSN: [id number]  Arrival date & time 03/31/11  1191   First MD Initiated Contact with Patient 03/31/11 1916      No chief complaint on file.   (Consider location/radiation/quality/duration/timing/severity/associated sxs/prior treatment) HPI  Patient presents to emergency department by EMS with complaint of rapid heart rate. Patient states she was at home and at approximately 4:30 PM- 5pmshe had acute onset of rapid heart rate with associated mild dizziness. Patient states she went to her neighbors house who is a Engineer, civil (consulting) and her neighbor told her that her heart was racing fast and irregular. At that time patient went to an urgent care off [address] and they too found her heart rate to be rapid and irregular and sent her to the emergency department by EMS for further evaluation and management. Patient states she has no known medical problems and takes no medicines on regular basis. She denies tobacco or illicit drug use. Patient states she'll have an occasional drink, approximately once a month, in a social setting. Patient states she was just doing housework today when she noticed the onset of rapid heart rate. Denies any recent alcohol use. Patient denies history of previous similar events. Patient makes note that her brother has history of atrial fib but again has no personal history of such. Patient sees her gynecologist, Dr. Lily Peer, as her primary care physician and has never seen any other specialists or primary care. Patient states her last menstrual period was on February 17. Patient states the rapid heart rate and dizziness associated with a mild burning sensation into her left shoulder blade but denies associated chest pain or shortness of breath. She denies fevers, chills, chest pain, SOB, cough, abdominal pain, nausea, vomiting, diaphoresis, lower extremity pain or swelling, history of DVT or PE, recent travel or immobilization, exogenous estrogen use, or hemoptysis. She  denies aggravating or alleviating factors. Patient has taken no medication prior to arrival for symptoms. Patient states the rapid heart rate sensation has been waxing and waning and will last for seconds to minutes before it resolves and then minutes later will return.  Past Medical History  Diagnosis Date  . Rheumatoid arthritis   . TMJ (dislocation of temporomandibular joint)   . Insomnia   . Depression   . Herpes     HERPES SIMPLEX VIRUS  . Dysplasia of cervix, unspecified     HISTORY OF CERVICAL DYSPLASIA  . Hyperprolactinemia   . BRCA1 negative     NEGATIVE MUTATION 01/05/06  . BRCA2 negative     NEGATIVE MUTATION 01/05/06  . History of cholecystectomy     Past Surgical History  Procedure Date  . Shoulder surgery     RIGHT ROTATOR CUFF  2000  . Chest surgery     RIGHT CLAVICLE FRACTURE-TRAUMATIC  . Cryotherapy     CERVIX  . Breast surgery     RIGHT BREAST BIOPSY 2001-PAPILOMA/LT BR BXY-BENIGN 2003  . Augmentation mammaplasty     AUGMENTATION 1998    Family History  Problem Relation Age of Onset  . Breast cancer Mother   . Hypertension Father     History  Substance Use Topics  . Smoking status: Never Smoker   . Smokeless tobacco: Never Used  . Alcohol Use: Yes     OCCASIONALLY    OB History    Grav Para Term Preterm Abortions TAB SAB Ect Mult Living  Review of Systems  All other systems reviewed and are negative.    Allergies  Codeine and Sulfonamide derivatives  Home Medications   Current Outpatient Rx  Name Route Sig Dispense Refill  . NAPROXEN SODIUM 220 MG PO TABS Oral Take 220 mg by mouth daily as needed. For head ache      BP 137/82  Pulse 112  Temp 98.5 F (36.9 C)  Resp 15  SpO2 100%  Physical Exam  Nursing note and vitals reviewed. Constitutional: She is oriented to person, place, and time. She appears well-developed and well-nourished. No distress.  HENT:  Head: Normocephalic and atraumatic.  Eyes:  Conjunctivae are normal.  Neck: Normal range of motion. Neck supple.  Cardiovascular: S1 normal, S2 normal, normal heart sounds, intact distal pulses and normal pulses.  An irregularly irregular rhythm present. Tachycardia present.  Exam reveals no gallop and no friction rub.   No murmur heard. Pulmonary/Chest: Effort normal and breath sounds normal. No respiratory distress. She has no wheezes. She has no rales. She exhibits no tenderness.  Abdominal: Soft. Bowel sounds are normal. She exhibits no distension and no mass. There is no tenderness. There is no rebound and no guarding.  Musculoskeletal: Normal range of motion. She exhibits no edema and no tenderness.  Neurological: She is alert and oriented to person, place, and time.  Skin: Skin is warm and dry. No rash noted. She is not diaphoretic. No erythema.  Psychiatric: She has a normal mood and affect.    ED Course  Procedures (including critical care time)  7:38 PM IV Cardizem bolus followed by infusion.   PO ASA   Date: 03/31/2011  Rate: 114  Rhythm: atrial fibrillation  QRS Axis: normal  Intervals: normal  ST/T Wave abnormalities: normal  Conduction Disutrbances:none  Narrative Interpretation:   Old EKG Reviewed: none available  9:26 PM afib but currently rate controlled in 90s. Dr. Charm Barges, cardiology to see in ER.   Labs Reviewed  BASIC METABOLIC PANEL - Abnormal; Notable for the following:    Potassium 3.4 (*)    GFR calc non Af Amer 86 (*)    All other components within normal limits  CBC  DIFFERENTIAL   No results found.   1. Atrial fibrillation    CRITICAL CARE Performed by: Jenness Corner   Total critical care time: 60  Critical care time was exclusive of separately billable procedures and treating other patients.  Critical care was necessary to treat or prevent imminent or life-threatening deterioration.  Critical care was time spent personally by me on the following activities: development of  treatment plan with patient and/or surrogate as well as nursing, discussions with consultants, evaluation of patient's response to treatment, examination of patient, obtaining history from patient or surrogate, ordering and performing treatments and interventions, ordering and review of laboratory studies, ordering and review of radiographic studies, pulse oximetry and re-evaluation of patient's condition.    MDM  Dr. Charm Barges to admit patient for cardioversion in hospital. Vital signs are stable.         Jenness Corner, [place] 03/31/11 2149

## 2011-04-01 ENCOUNTER — Other Ambulatory Visit: Payer: Self-pay

## 2011-04-01 ENCOUNTER — Encounter (HOSPITAL_COMMUNITY): Payer: Self-pay | Admitting: *Deleted

## 2011-04-01 DIAGNOSIS — I4891 Unspecified atrial fibrillation: Principal | ICD-10-CM

## 2011-04-01 LAB — CARDIAC PANEL(CRET KIN+CKTOT+MB+TROPI)
CK, MB: 1.9 ng/mL (ref 0.3–4.0)
Relative Index: INVALID (ref 0.0–2.5)
Total CK: 99 U/L (ref 7–177)
Troponin I: <0.3 ng/mL (ref <0.30)

## 2011-04-01 LAB — TSH: TSH: 3.431 u[IU]/mL (ref 0.350–4.500)

## 2011-04-01 LAB — PROTIME-INR
INR: 1.18 (ref 0.00–1.49)
Prothrombin Time: 15.3 seconds — ABNORMAL HIGH (ref 11.6–15.2)

## 2011-04-01 LAB — CBC
HCT: 35.5 % — ABNORMAL LOW (ref 36.0–46.0)
Hemoglobin: 12 g/dL (ref 12.0–15.0)
MCH: 30 pg (ref 26.0–34.0)
MCHC: 33.8 g/dL (ref 30.0–36.0)
MCV: 88.8 fL (ref 78.0–100.0)
Platelets: 235 10*3/uL (ref 150–400)
RBC: 4 MIL/uL (ref 3.87–5.11)
RDW: 12.5 % (ref 11.5–15.5)
WBC: 5.8 10*3/uL (ref 4.0–10.5)

## 2011-04-01 LAB — POCT I-STAT TROPONIN I: Troponin i, poc: 0 ng/mL (ref 0.00–0.08)

## 2011-04-01 LAB — APTT: aPTT: 33 seconds (ref 24–37)

## 2011-04-01 MED ORDER — ASPIRIN EC 81 MG PO TBEC: 81.0000 mg | DELAYED_RELEASE_TABLET | Freq: Every day | ORAL | Status: AC

## 2011-04-01 MED ORDER — ZOLPIDEM TARTRATE 5 MG PO TABS: 10.0000 mg | ORAL_TABLET | Freq: Every evening | ORAL | Status: DC | PRN

## 2011-04-01 MED ORDER — ONDANSETRON HCL 4 MG/2ML IJ SOLN: 4.0000 mg | Freq: Three times a day (TID) | INTRAMUSCULAR | Status: AC | PRN

## 2011-04-01 MED ORDER — ENOXAPARIN SODIUM 60 MG/0.6ML ~~LOC~~ SOLN
60.0000 mg | Freq: Two times a day (BID) | SUBCUTANEOUS | Status: DC
  Filled 2011-04-01: qty 0.6

## 2011-04-01 MED ORDER — DILTIAZEM HCL 100 MG IV SOLR
5.0000 mg/h | INTRAVENOUS | Status: DC
  Filled 2011-04-01: qty 100

## 2011-04-01 MED ORDER — ENOXAPARIN SODIUM 60 MG/0.6ML ~~LOC~~ SOLN
60.0000 mg | Freq: Two times a day (BID) | SUBCUTANEOUS | Status: DC
  Administered 2011-04-01: 60 mg via SUBCUTANEOUS
  Filled 2011-04-01 (×2): qty 0.6

## 2011-04-01 MED ORDER — ACETAMINOPHEN 325 MG PO TABS
650.0000 mg | ORAL_TABLET | ORAL | Status: DC | PRN
  Administered 2011-04-01: 650 mg via ORAL
  Filled 2011-04-01: qty 2

## 2011-04-01 MED ORDER — FLECAINIDE ACETATE 150 MG PO TABS: 300.0000 mg | ORAL_TABLET | Freq: Every day | ORAL | Status: DC | PRN

## 2011-04-01 MED ORDER — POTASSIUM CHLORIDE CRYS ER 20 MEQ PO TBCR
40.0000 meq | EXTENDED_RELEASE_TABLET | Freq: Once | ORAL | Status: AC
  Administered 2011-04-01: 40 meq via ORAL
  Filled 2011-04-01: qty 2

## 2011-04-01 MED ORDER — SODIUM CHLORIDE 0.9 % IV SOLN
INTRAVENOUS | Status: AC
  Administered 2011-04-01 (×2): via INTRAVENOUS

## 2011-04-01 NOTE — Progress Notes (Signed)
Cardizem drip stopped per MD order due to low SBP (85/36; 90/43). Pt is in SR, otherwise stable and comfortable

## 2011-04-01 NOTE — ED Provider Notes (Signed)
Medical screening examination/treatment/procedure(s) were conducted as a shared visit with non-physician practitioner(s) and myself.  I personally evaluated the patient during the encounter.  Please see completed note for this encounter.  Raeford Razor, MD 04/01/11 [id number]

## 2011-04-01 NOTE — Progress Notes (Signed)
Patients IV and tele d/c; patient verbalizes understanding of discharge information and is currently leaving unit_______________________________________D. Manson Passey RN

## 2011-04-01 NOTE — Discharge Summary (Signed)
Discharge Summary   Patient ID: Jenna Cortez MRN: [id number], DOB/AGE: [birth year] 46 y.o.  Primary MD: Neena Rhymes, MD Primary Cardiologist: New to [facility], will f/u w/ Dr. Daleen Squibb  Admit date: 03/31/2011 D/C date:     04/01/2011     Primary Discharge Diagnoses:  1. Atrial Fibrillation w/ RVR, newly diagnosed this admission  - Converted to NSR after receiving Flecainide 200mg  x1  - Echo 04/01/11 on 04/01/11 revealing normal LV systolic and diastolic function, EF 60-65%, no WMA, no significant valvular dysfunction, normal RV size and systolic function, and upper normal limit of left atrial size  - CHADS2 score 0  - Will provide prescription for PRN Flecainide  - Outpatient exercise tolerance test and f/u w/ Dr. Daleen Squibb  2. Hypokalemia  - Resolved with supplementation  Secondary Discharge Diagnoses:  1. Dislocation of Temporomandibular Joint 2. Insomnia 3. Depression 4. Herpes Simplex Virus 5. H/o cervical dysplasia s/p cryotherapy 6. Hyperprolactinemia 7. BRCA1 & BRCA2 negative mutation 01/05/06 8. S/p Cholecystectomy 9. S/p Right rotator cuff repair 2000 10. S/p Right clavicular fracture repair 11. S/p Breast Augmentation 1998   Allergies Allergen Reactions  . Codeine Itching  . Sulfonamide Derivatives Rash    Diagnostic Studies/Procedures:   04/01/11 - 2D Echocardiogram Study Conclusions:  - Left ventricle: The cavity size was normal. Vaishnavi Dalby thickness was normal. Systolic function was normal. The estimated ejection fraction was in the range of 60% to 65%. Rudolfo Brandow motion was normal; there were no regional Doral Ventrella motion abnormalities. Left ventricular diastolic function parameters were normal. - Aortic valve: There was no stenosis. - Mitral valve: No significant regurgitation. - Left atrium: The atrium was at the upper limits of normal in size. - Right ventricle: The cavity size was normal. Systolic function was normal. - Pulmonary arteries: No complete TR doppler jet so  unable to estimate PA systolic pressure. - Systemic veins: IVC measured 2.1 cm with some respirophasic variation, suggesting RA pressure 10 mmHg. Impressions: Normal LV size and systolic function, EF 60-65%. Normal diastolic function. No significant valvular dysfunction. Normal RV size and systolic function. Upper normal left atrial size.    History of Present Illness: 46 y.o. female w/ no prior cardiac history who presented to [facility] on 03/31/11 with complaints of palpitations and was found to be in A.fib w/ RVR.  She has had prior brief episodes of palpitations over the past few years but they lasted only seconds. The most recent palpitations began on day of presentation and were associated with a feeling of SOB and chest discomfort. She felt her pulse and found it to be rapid and irregular for 6 hours until she presented to the ED. She has a brother who also has pAF onset in his 36s. She drinks 2 cups of coffee per day and only rarely drinks ETOH.  Hospital Course: In the ED, EKG revealed A. Fib w/ RVR with no acute ischemic changes. CXR was without acute cardiopulmonary abnormalities. Labs were significant for K+ 3.4 and negative poc troponin. She was rate controlled on diltiazem drip and admitted for further evaluation and treatment.  She received 200mg  Felcainide with conversion to NSR. Cardiac enzymes were negative and TSH normal. An echocardiogram was completed on 04/01/11 revealing normal LV systolic and diastolic function, EF 60-65%, no WMA, no significant valvular dysfunction, normal RV size and systolic function, and upper normal limit of left atrial size. She remained in NSR without complaints of chest pain or palpitations. CHADS2 score is 0.  She was  seen and evaluated by Dr. Daleen Squibb who felt he was stable for discharge home with plans for follow up as scheduled below. He would like her to take 81mg  ASA and have a prescription for PRN Flecainide should the palpitations and  irregular heart beat recur. She will have an exercise tolerance test as an outpatient to evaluate for cardiac ischemia.   Discharge Vitals: Blood pressure 104/47, pulse 76, temperature 97.5 F (36.4 C), temperature source Oral, resp. rate 19, height 5\' 4"  (1.626 m), weight 136 lb 3.2 oz (61.78 kg), last menstrual period 03/23/2011, SpO2 100.00%.  Labs: Component Value Date   WBC 5.8 04/01/2011   HGB 12.0 04/01/2011   HCT 35.5* 04/01/2011   MCV 88.8 04/01/2011   PLT 235 04/01/2011    Lab 03/31/11 1947  NA 138  K 3.4*  CL 106  CO2 22  BUN 14  CREATININE 0.82  CALCIUM 9.7  GLUCOSE 85   Basename 04/01/11 0209  CKTOTAL 99  CKMB 1.9  TROPONINI <0.30     03/31/2011 20:01  Troponin i, poc 0.00     03/31/2011 22:45  Preg Test, Ur NEGATIVE     04/01/2011 02:10  TSH 3.431    Discharge Medications   Medication List  As of 04/01/2011  3:27 PM   TAKE these medications         aspirin EC 81 MG tablet   Take 1 tablet (81 mg total) by mouth daily.      flecainide 150 MG tablet   Commonly known as: TAMBOCOR   Take 2 tablets (300 mg total) by mouth daily as needed (Palpitations and irregular heart beat). Please notify [facility] office at [phone] if you require a dose of flecainide before your next clinic visit.      naproxen sodium 220 MG tablet   Commonly known as: ANAPROX   Take 220 mg by mouth daily as needed. For head ache            Disposition   Discharge Orders    Future Appointments: Provider: Department: Dept Phone: Center:   04/08/2011 2:00 PM Beatrice Lecher, PA [facility] [phone] [facility]     Joint Appt [facility] [place] [phone] [facility]   04/27/2011 12:30 PM Beatrice Lecher, PA [facility] [phone] [facility]     Future Orders Please Complete By Expires   Diet - low sodium heart healthy      Increase activity slowly      Discharge instructions      Comments:   **PLEASE REMEMBER TO BRING  ALL OF YOUR MEDICATIONS TO EACH OF YOUR FOLLOW-UP OFFICE VISITS.      Follow-up Information    Follow up with [facility] on 04/08/2011. (Exercise Treadmill Test @ 2:00)    Contact information:   [facility] [address] [place] [address] [phone]      Follow up with Tereso Newcomer, PA on 04/27/2011. (12:30)    Contact information:   [facility] [address] [place] [address] [phone]           Outstanding Labs/Studies:  1. Outpatient Exercise Tolerance Test as scheduled above  Duration of Discharge Encounter: Greater than 30 minutes including physician and PA time.  Signed, HOPE, JESSICA PA-C 04/01/2011, 3:27 PM  Jesse Sans. Daleen Squibb, MD, [facility] Pager:  [phone]

## 2011-04-01 NOTE — Discharge Instructions (Signed)
   You have a Stress Test scheduled on 04/08/11 at [facility] in [place]. Your doctor has ordered this test to get a better idea of how your heart works.  Please arrive 15 minutes early for paperwork.   Location: [address] [place], [place] [address] [phone]  Instructions:  Wear comfortable clothes and shoes as you will be walking on a treadmill.   It is OK to take your morning meds EXCEPT for those types of medicines listed below or otherwise instructed.  Special Medication Instructions:  Beta blockers such as metoprolol (Lopressor/Toprol XL), atenolol (Tenormin), carvedilol (Coreg), nebivolol (Bystolic), propranolol (Inderal) should not be taken for 24 hours before the test.  Calcium channel blockers such as diltiazem (Cardizem) or verapmil (Calan) should not be taken for 24 hours before the test.  Remove nitroglycerin patches and do not take nitrate preparations such as Imdur/isosorbide the day of your test.  For questions please call our office.

## 2011-04-01 NOTE — Progress Notes (Signed)
Cardiology Progress Note Patient Name: Jenna Cortez Date of Encounter: 04/01/2011, 7:21 AM     Subjective  Patient converted to NSR overnight. She feels well this morning without complaints of chest pain, palpitations, or sob.   Objective   Telemetry: Converted from A. Fib to Sinus rhythm around 3:40 this am and has remained in NSR 70s-80s  Medications: . enoxaparin (LOVENOX) injection  60 mg Subcutaneous Q12H  . flecainide  200 mg Oral Once  . sodium chloride  500 mL Intravenous Once  . DISCONTD: aspirin  324 mg Oral Once  . DISCONTD: diltiazem (CARDIZEM) infusion  5 mg/hr Intravenous To Major   . sodium chloride 100 mL/hr at 04/01/11 0600    Physical Exam: Temp:  [97.7 F (36.5 C)-98.5 F (36.9 C)] 98 F (36.7 C) (02/28 0515) Pulse Rate:  [75-119] 75  (02/28 0515) Resp:  [14-23] 18  (02/28 0515) BP: (85-137)/(36-86) 90/43 mmHg (02/28 0515) SpO2:  [98 %-100 %] 99 % (02/28 0515) Weight:  [136 lb 3.2 oz (61.78 kg)] 136 lb 3.2 oz (61.78 kg) (02/28 0145)  General: Pleasant white female, in no acute distress. Head: Normocephalic, atraumatic, sclera non-icteric, nares are without discharge.  Neck: Supple. Negative for carotid bruits or JVD Lungs: Clear bilaterally to auscultation without wheezes, rales, or rhonchi. Breathing is unlabored. Heart: RRR S1 S2 soft 1/6 systolic murmur @ LUSB, No rubs or gallops.  Abdomen: Soft, non-tender, non-distended with normoactive bowel sounds. No rebound/guarding. No obvious abdominal masses. Msk:  Strength and tone appear normal for age. Extremities: No edema. No clubbing or cyanosis. Distal pedal pulses are intact and equal bilaterally. Neuro: Alert and oriented X 3. Moves all extremities spontaneously. Psych:  Responds to questions appropriately with a normal affect.   Intake/Output Summary (Last 24 hours) at 04/01/11 0721 Last data filed at 04/01/11 0500  Gross per 24 hour  Intake     10 ml  Output      0 ml  Net     10  ml    Labs: [facility] 03/31/11 1947  NA 138  K 3.4*  CL 106  CO2 22  GLUCOSE 85  BUN 14  CREATININE 0.82  CALCIUM 9.7   Basename 04/01/11 0610 03/31/11 1947  WBC 5.8 7.8  NEUTROABS -- 4.9  HGB 12.0 13.2  HCT 35.5* 37.8  MCV 88.8 88.1  PLT 235 283   Basename 04/01/11 0209  CKTOTAL 99  CKMB 1.9  TROPONINI <0.30   Radiology/Studies:   03/31/2011 - CXR  Findings: Portable upright AP view 2139 hours.  Upper limits of normal to increased lung volumes.  Cardiac size and mediastinal contours are within normal limits.  Visualized tracheal air column is within normal limits.  No pneumothorax, pulmonary edema, pleural effusion or confluent pulmonary opacity.  IMPRESSION: No acute cardiopulmonary abnormality.      Assessment and Plan  46 y.o. female w/ no prior cardiac history who presented to [facility] on 03/31/11 with complaints of palpitations and was found to be in A.fib w/ RVR.  1. Atrial Fibrillation w/ RVR: Assumed new onset yesterday. CHA2DS2-VASc score 1 (female). Received 200mg  Flecainide around midnight. Converted to NSR at around 3:40 this am. Cardizem stopped at 5am for mild hypotension. Vital signs stable and has remained in NSR 70s-80s. Unsure of the etiology at this point. TSH and Echo pending. Will supplement K+.   Disposition: If echo normal, will go home today.  Signed, HOPE, JESSICA PA-C  Patient  examined and agree except changes made. If Echo is normal, will discharge home on 81mg  of ASA and prn Flecainide.  Valera Castle, MD 04/01/2011 8:03 AM

## 2011-04-01 NOTE — Progress Notes (Signed)
ANTICOAGULATION CONSULT NOTE - Initial Consult  Pharmacy Consult for Lovenox  Indication: atrial fibrillation  Allergies  Allergen Reactions  . Codeine Itching  . Sulfonamide Derivatives Rash    Patient Measurements: Height: 5\' 4"  (162.6 cm) Weight: 136 lb 3.2 oz (61.78 kg) (scale c) IBW/kg (Calculated) : 54.7   Vital Signs: Temp: 98 F (36.7 C) (02/28 0145) Temp src: Oral (02/28 0145) BP: 123/86 mmHg (02/28 0145) Pulse Rate: 89  (02/28 0145)  Labs:  Basename 03/31/11 1947  HGB 13.2  HCT 37.8  PLT 283  APTT --  LABPROT --  INR --  HEPARINUNFRC --  CREATININE 0.82  CKTOTAL --  CKMB --  TROPONINI --   Estimated Creatinine Clearance: 76.4 ml/min (by C-G formula based on Cr of 0.82).  Medical History: Past Medical History  Diagnosis Date  . TMJ (dislocation of temporomandibular joint)   . Insomnia   . Depression   . Herpes     HERPES SIMPLEX VIRUS  . Dysplasia of cervix, unspecified     HISTORY OF CERVICAL DYSPLASIA  . Hyperprolactinemia   . BRCA1 negative     NEGATIVE MUTATION 01/05/06  . BRCA2 negative     NEGATIVE MUTATION 01/05/06  . History of cholecystectomy     Medications:  Prescriptions prior to admission  Medication Sig Dispense Refill  . naproxen sodium (ANAPROX) 220 MG tablet Take 220 mg by mouth daily as needed. For head ache        Assessment: 46 yo female with Afib for Lovenox   Goal of Therapy:  Full anticoagulation with Lovenox   Plan:  Lovenox 60 mg SQ q12  Eddie Candle 04/01/2011,2:13 AM

## 2011-04-01 NOTE — Progress Notes (Signed)
*  PRELIMINARY RESULTS* Echocardiogram 2D Echocardiogram has been performed.  Glean Salen [facility] 04/01/2011, 10:50 AM

## 2011-04-08 ENCOUNTER — Ambulatory Visit (INDEPENDENT_AMBULATORY_CARE_PROVIDER_SITE_OTHER): Payer: Managed Care, Other (non HMO) | Admitting: Physician Assistant

## 2011-04-08 DIAGNOSIS — I4891 Unspecified atrial fibrillation: Secondary | ICD-10-CM

## 2011-04-08 NOTE — Progress Notes (Signed)
Exercise Treadmill Test  Pre-Exercise Testing Evaluation Rhythm: normal sinus  Rate: 78   PR:  .14 QRS:  .08  QT:  .36 QTc: .41     Test  Exercise Tolerance Test Ordering MD: Valera Castle, MD  Interpreting MD:  Tereso Newcomer PA-C  Unique Test No: 1  Treadmill:  1  Indication for ETT: A-FIB  Contraindication to ETT: No   Stress Modality: exercise - treadmill  Cardiac Imaging Performed: non   Protocol: standard Bruce - maximal  Max BP:  192/67  Max MPHR (bpm):  177 85% MPR (bpm):  150  MPHR obtained (bpm):  179 % MPHR obtained:  101%  Reached 85% MPHR (min:sec):  5:14 Total Exercise Time (min-sec):  10:38  Workload in METS:  12.7 Borg Scale: 15  Reason ETT Terminated:  patient's desire to stop    ST Segment Analysis At Rest: normal ST segments - no evidence of significant ST depression With Exercise: no evidence of significant ST depression  Other Information Arrhythmia:  No Angina during ETT:  absent (0) Quality of ETT:  diagnostic  ETT Interpretation:  normal - no evidence of ischemia by ST analysis  Comments: Excellent exercise tolerance. No chest pain. Normal BP response to exercise. No ST-T changes to suggest ischemia.   Recommendations: Follow up with Dr. Valera Castle as directed. Tereso Newcomer, PA-C  3:45 PM 04/08/2011

## 2011-04-19 ENCOUNTER — Encounter: Payer: Managed Care, Other (non HMO) | Admitting: Physician Assistant

## 2011-04-27 ENCOUNTER — Encounter: Payer: Managed Care, Other (non HMO) | Admitting: Physician Assistant

## 2011-05-07 ENCOUNTER — Encounter: Payer: Managed Care, Other (non HMO) | Admitting: Physician Assistant

## 2011-06-25 ENCOUNTER — Ambulatory Visit (INDEPENDENT_AMBULATORY_CARE_PROVIDER_SITE_OTHER): Payer: Managed Care, Other (non HMO) | Admitting: Cardiology

## 2011-06-25 ENCOUNTER — Encounter: Payer: Self-pay | Admitting: Cardiology

## 2011-06-25 VITALS — BP 110/78 | HR 76 | Ht 64.0 in | Wt 137.0 lb

## 2011-06-25 DIAGNOSIS — I4891 Unspecified atrial fibrillation: Secondary | ICD-10-CM

## 2011-06-25 NOTE — Patient Instructions (Signed)
Your physician recommends that you continue on your current medications as directed. Please refer to the Current Medication list given to you today.     

## 2011-06-25 NOTE — Assessment & Plan Note (Signed)
No clinical recurrence. Continue with current plan. Followup in one year.

## 2011-06-25 NOTE — Progress Notes (Signed)
HPI  Jenna Cortez comes back in today for followup of her new onset atrial fib. She has had intermittent palpitations that are short lived lasting only seconds. She has not had to take her flecainide.  She is undergoing a very stressful divorce. She is trying to walk. He continues on aspirin 81 mg a day. Past Medical History  Diagnosis Date  . TMJ (dislocation of temporomandibular joint)   . Insomnia   . Depression   . Herpes     HERPES SIMPLEX VIRUS  . Dysplasia of cervix, unspecified     HISTORY OF CERVICAL DYSPLASIA  . Hyperprolactinemia   . BRCA1 negative     NEGATIVE MUTATION 01/05/06  . BRCA2 negative     NEGATIVE MUTATION 01/05/06  . History of cholecystectomy     Current Outpatient Prescriptions  Medication Sig Dispense Refill  . ALPRAZolam (XANAX) 0.5 MG tablet Take 0.25 mg by mouth as needed.      Marland Kitchen aspirin EC 81 MG tablet Take 1 tablet (81 mg total) by mouth daily.      . naproxen sodium (ANAPROX) 220 MG tablet Take 220 mg by mouth daily as needed. For head ache      . flecainide (TAMBOCOR) 150 MG tablet Take 2 tablets (300 mg total) by mouth daily as needed (Palpitations and irregular heart beat). Please notify [facility] office at [phone] if you require a dose of flecainide before your next clinic visit.  10 tablet  0    Allergies  Allergen Reactions  . Codeine Itching  . Sulfonamide Derivatives Rash    Family History  Problem Relation Age of Onset  . Breast cancer Mother   . Hypertension Father   . Atrial fibrillation Brother     History   Social History  . Marital Status: Legally Separated    Spouse Name: N/A    Number of Children: N/A  . Years of Education: N/A   Occupational History  . Not on file.   Social History Main Topics  . Smoking status: Never Smoker   . Smokeless tobacco: Never Used  . Alcohol Use: Yes     OCCASIONALLY  . Drug Use: No  . Sexually Active: Yes    Birth Control/ Protection: Condom   Other Topics Concern  .  Not on file   Social History Narrative  . No narrative on file    ROS ALL NEGATIVE EXCEPT THOSE NOTED IN HPI  PE  General Appearance: well developed, well nourished in no acute distress HEENT: symmetrical face, PERRLA, good dentition  Neck: no JVD, thyromegaly, or adenopathy, trachea midline Chest: symmetric without deformity Cardiac: PMI non-displaced, RRR, normal S1, S2, no gallop, 2/6 midsystolic murmur at the apex. No definite click heard. Lung: clear to ausculation and percussion Vascular: all pulses full without bruits  Abdominal: nondistended, nontender, good bowel sounds, no HSM, no bruits Extremities: no cyanosis, clubbing or edema, no sign of DVT, no varicosities  Skin: normal color, no rashes Neuro: alert and oriented x 3, non-focal Pysch: normal affect  EKG Normal sinus rhythm, normal intervals. BMET    Component Value Date/Time   NA 138 03/31/2011 1947   K 3.4* 03/31/2011 1947   CL 106 03/31/2011 1947   CO2 22 03/31/2011 1947   GLUCOSE 85 03/31/2011 1947   BUN 14 03/31/2011 1947   CREATININE 0.82 03/31/2011 1947   CALCIUM 9.7 03/31/2011 1947   GFRNONAA 86* 03/31/2011 1947   GFRAA >90 03/31/2011 1947    Lipid Panel  No results found for this basename: chol, trig, hdl, cholhdl, vldl, ldlcalc    CBC    Component Value Date/Time   WBC 5.8 04/01/2011 0610   RBC 4.00 04/01/2011 0610   HGB 12.0 04/01/2011 0610   HCT 35.5* 04/01/2011 0610   PLT 235 04/01/2011 0610   MCV 88.8 04/01/2011 0610   MCH 30.0 04/01/2011 0610   MCHC 33.8 04/01/2011 0610   RDW 12.5 04/01/2011 0610   LYMPHSABS 2.0 03/31/2011 1947   MONOABS 0.8 03/31/2011 1947   EOSABS 0.1 03/31/2011 1947   BASOSABS 0.0 03/31/2011 1947

## 2011-07-29 ENCOUNTER — Encounter: Payer: Self-pay | Admitting: Gynecology

## 2011-07-29 ENCOUNTER — Ambulatory Visit (INDEPENDENT_AMBULATORY_CARE_PROVIDER_SITE_OTHER): Payer: Managed Care, Other (non HMO) | Admitting: Gynecology

## 2011-07-29 VITALS — BP 126/80 | Ht 63.75 in | Wt 135.0 lb

## 2011-07-29 DIAGNOSIS — Z8639 Personal history of other endocrine, nutritional and metabolic disease: Secondary | ICD-10-CM

## 2011-07-29 DIAGNOSIS — Z862 Personal history of diseases of the blood and blood-forming organs and certain disorders involving the immune mechanism: Secondary | ICD-10-CM

## 2011-07-29 DIAGNOSIS — Z01419 Encounter for gynecological examination (general) (routine) without abnormal findings: Secondary | ICD-10-CM

## 2011-07-29 DIAGNOSIS — I4891 Unspecified atrial fibrillation: Secondary | ICD-10-CM | POA: Insufficient documentation

## 2011-07-29 NOTE — Patient Instructions (Signed)

## 2011-07-29 NOTE — Progress Notes (Signed)
Jenna Cortez [birth year] [id number]   History:    46 y.o.  for annual gyn exam with no complaints today. She did state in January of this year she had atrial fibrillation and has been followed by Dr.Wall (cardiologist) patient is otherwise doing well. Review of her record indicated last year she was weighing 144 is now weighing 135 with a BMI of 23.25. She was reminded the importance of calcium and vitamin D for osteoporosis prevention. She does her monthly self breast examination. She had a normal mammogram February this year. Her cycles are regular. And she is using condoms for contraception. In 2012 she had a negative HIV, negative RPR, negative hepatitis B and C. testing. Patient history of colonic polyps in 2010 and also has family history of colon cancer. 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  Past medical history,surgical history, family history and social history were all reviewed and documented in the EPIC chart.  Gynecologic History Patient's last menstrual period was 07/10/2011. Contraception: condoms Last Pap: 2012. Results were: normal Last mammogram: 2013. Results were: normal  Obstetric History OB History    Grav Para Term Preterm Abortions TAB SAB Ect Mult Living   2 2 2       2      # Outc Date GA Lbr Len/2nd Wgt Sex Del Anes PTL Lv   1 TRM     F SVD  No Yes   2 TRM     M SVD  No Yes       ROS: A ROS was performed and pertinent positives and negatives are included in the history.  GENERAL: No fevers or chills. HEENT: No change in vision, no earache, sore throat or sinus congestion. NECK: No pain or stiffness. CARDIOVASCULAR: No chest pain or pressure. No palpitations. PULMONARY: No shortness of breath, cough or wheeze. GASTROINTESTINAL: No abdominal pain, nausea, vomiting or diarrhea, melena or bright red blood per rectum. GENITOURINARY: No urinary frequency, urgency, hesitancy or  dysuria. MUSCULOSKELETAL: No joint or muscle pain, no back pain, no recent trauma. DERMATOLOGIC: No rash, no itching, no lesions. ENDOCRINE: No polyuria, polydipsia, no heat or cold intolerance. No recent change in weight. HEMATOLOGICAL: No anemia or easy bruising or bleeding. NEUROLOGIC: No headache, seizures, numbness, tingling or weakness. PSYCHIATRIC: No depression, no loss of interest in normal activity or change in sleep pattern.     Exam: chaperone present  BP 126/80  Ht 5' 3.75" (1.619 m)  Wt 135 lb (61.236 kg)  BMI 23.35 kg/m2  LMP 07/10/2011  Body mass index is 23.35 kg/(m^2).  General appearance : Well developed well nourished female. No acute distress HEENT: Neck supple, trachea midline, no carotid bruits, no thyroidmegaly Lungs: Clear to auscultation, no rhonchi or wheezes, or rib retractions  Heart: Regular rate and rhythm, no murmurs or gallops Breast:Examined in sitting and supine position were symmetrical in appearance, no palpable masses or tenderness,  no skin retraction, no nipple inversion, no nipple discharge, no skin discoloration, no axillary or supraclavicular lymphadenopathy Abdomen: no palpable masses or tenderness, no rebound or guarding Extremities: no edema or skin discoloration or tenderness  Pelvic:  Bartholin, Urethra, Skene Glands: Within normal limits             Vagina: No gross lesions or discharge  Cervix: No gross lesions or discharge  Uterus  anteverted, normal size, shape and consistency, non-tender and mobile  Adnexa  Without masses or tenderness  Anus and perineum  normal   Rectovaginal  normal sphincter tone without palpated masses or tenderness             Hemoccult not done     Assessment/Plan:  46 y.o. female for annual exam with no abnormalities noted. Since she had lab work done recently  the only test done today we'll be a cholesterol along with a urinalysis and a prolactin level. Many years ago patient had hyperprolactinemia. She's  otherwise asymptomatic and doing well. No Pap smear done today New guidelines discussed.   Ok Edwards MD, 5:12 PM 07/29/2011

## 2011-07-30 LAB — URINALYSIS W MICROSCOPIC + REFLEX CULTURE
Bacteria, UA: NONE SEEN
Bilirubin Urine: NEGATIVE
Casts: NONE SEEN
Crystals: NONE SEEN
Glucose, UA: NEGATIVE mg/dL
Hgb urine dipstick: NEGATIVE
Ketones, ur: NEGATIVE mg/dL
Leukocytes, UA: NEGATIVE
Nitrite: NEGATIVE
Protein, ur: NEGATIVE mg/dL
Specific Gravity, Urine: 1.02 (ref 1.005–1.030)
Squamous Epithelial / LPF: NONE SEEN
Urobilinogen, UA: 0.2 mg/dL (ref 0.0–1.0)
pH: 5.5 (ref 5.0–8.0)

## 2011-07-30 LAB — PROLACTIN: Prolactin: 8.2 ng/mL

## 2011-07-30 LAB — CHOLESTEROL, TOTAL: Cholesterol: 141 mg/dL (ref 0–200)

## 2011-08-10 ENCOUNTER — Encounter: Payer: Self-pay | Admitting: *Deleted

## 2011-08-10 NOTE — Progress Notes (Signed)
Patient ID: Jenna Cortez, female   DOB: 12/23/1965, 46 y.o.   MRN: [id number] Pt called c/o tingling in left breast, pt requesting diag. Mammogram.left message on vm to make office visit to see JF.

## 2011-08-27 ENCOUNTER — Telehealth: Payer: Self-pay | Admitting: *Deleted

## 2011-08-27 ENCOUNTER — Ambulatory Visit (INDEPENDENT_AMBULATORY_CARE_PROVIDER_SITE_OTHER): Payer: Managed Care, Other (non HMO) | Admitting: Gynecology

## 2011-08-27 ENCOUNTER — Encounter: Payer: Self-pay | Admitting: Gynecology

## 2011-08-27 VITALS — BP 128/70

## 2011-08-27 DIAGNOSIS — N649 Disorder of breast, unspecified: Secondary | ICD-10-CM

## 2011-08-27 DIAGNOSIS — N63 Unspecified lump in unspecified breast: Secondary | ICD-10-CM

## 2011-08-27 NOTE — Progress Notes (Signed)
Patient is a 46 year old gravida 2 para 2 who presented to the office today complaining over the past week of skin changes on her left breast whereby she also noticed a ridge and a prominent vein. Patient had a normal mammogram in February this year. Patient mother had history of breast cancer. Patient has been tested for the BRCA one BRCA2 mutation and was negative. Patient with prior history of saline implants on both breasts.  Exam: Both breasts were examined in sitting and supine position. Prominent vein was noted on the left breast from the 10 to 12:00 position along with an indurated ridge that was contiguous with the periareolar incision were her implant had been inserted through. No true discernible mass noted. There was no supraclavicular or axillary lymphadenopathy on either breast. Contralateral breast was normal.   Physical Exam  Pulmonary/Chest:      Patient will be referred to the radiologist for diagnostic mammogram possible ultrasound left breast.

## 2011-08-27 NOTE — Telephone Encounter (Signed)
Order placed for both test below.

## 2011-08-27 NOTE — Telephone Encounter (Signed)
Message copied by Aura Camps on Fri Aug 27, 2011 11:31 AM ------      Message from: Ok Edwards      Created: Fri Aug 27, 2011 10:53 AM       Please schedule left breast diagnostic mammogram with possible ultrasound. See encounter note from today's visit.

## 2011-08-27 NOTE — Progress Notes (Deleted)
Patient is a 46 year old gravida 2 para 2 who presented to the office today complaining over the past week of skin changes on her left breast whereby she also noticed a ridge and a prominent vein. Patient had a normal mammogram in February this year. Patient mother had history of breast cancer. Patient has been tested for the BRCA one BRCA2 mutation and was negative. Patient with prior history of saline implants on both breasts.  Exam: Both breasts were examined in sitting and supine position. Prominent vein was noted on the left breast from the 10 to 12:00 position along with an indurated ridge that was contiguous with the periareolar incision were her implant had been inserted through. No true discernible mass noted. There was no supraclavicular or axillary lymphadenopathy on either breast. Contralateral breast was normal.  Patient will be referred to the radiologist for ultrasound and possible diagnostic mammogram of the left breast.

## 2011-08-30 NOTE — Telephone Encounter (Signed)
Breast appointment for 09/03/11.

## 2011-09-03 ENCOUNTER — Other Ambulatory Visit: Payer: Managed Care, Other (non HMO)

## 2011-09-07 ENCOUNTER — Ambulatory Visit
Admission: RE | Admit: 2011-09-07 | Discharge: 2011-09-07 | Disposition: A | Discharge status: Home / Self Care | Payer: Managed Care, Other (non HMO) | Source: Ambulatory Visit | Attending: Gynecology | Admitting: Gynecology

## 2011-09-07 DIAGNOSIS — N63 Unspecified lump in unspecified breast: Secondary | ICD-10-CM

## 2011-09-30 ENCOUNTER — Other Ambulatory Visit: Payer: Self-pay | Admitting: Gynecology

## 2011-09-30 MED ORDER — ALPRAZOLAM 0.5 MG PO TABS: ORAL_TABLET | ORAL | Status: DC

## 2011-09-30 NOTE — Telephone Encounter (Signed)
Called into pharmacy

## 2011-10-01 NOTE — Telephone Encounter (Signed)
Called into pharmacy

## 2013-03-21 ENCOUNTER — Encounter: Payer: Self-pay | Admitting: Physician Assistant

## 2013-04-30 ENCOUNTER — Emergency Department (HOSPITAL_COMMUNITY)
Admission: EM | Admit: 2013-04-30 | Discharge: 2013-04-30 | Disposition: A | Discharge status: Home / Self Care | Payer: BC Managed Care – PPO | Attending: Emergency Medicine | Admitting: Emergency Medicine

## 2013-04-30 ENCOUNTER — Emergency Department (HOSPITAL_COMMUNITY): Payer: BC Managed Care – PPO

## 2013-04-30 ENCOUNTER — Telehealth: Payer: Self-pay | Admitting: Physician Assistant

## 2013-04-30 ENCOUNTER — Encounter (HOSPITAL_COMMUNITY): Payer: Self-pay | Admitting: Emergency Medicine

## 2013-04-30 DIAGNOSIS — R0602 Shortness of breath: Secondary | ICD-10-CM | POA: Insufficient documentation

## 2013-04-30 DIAGNOSIS — Z8659 Personal history of other mental and behavioral disorders: Secondary | ICD-10-CM | POA: Insufficient documentation

## 2013-04-30 DIAGNOSIS — I4891 Unspecified atrial fibrillation: Secondary | ICD-10-CM | POA: Insufficient documentation

## 2013-04-30 DIAGNOSIS — Z7982 Long term (current) use of aspirin: Secondary | ICD-10-CM | POA: Insufficient documentation

## 2013-04-30 DIAGNOSIS — Z9089 Acquired absence of other organs: Secondary | ICD-10-CM | POA: Insufficient documentation

## 2013-04-30 DIAGNOSIS — Z8669 Personal history of other diseases of the nervous system and sense organs: Secondary | ICD-10-CM | POA: Insufficient documentation

## 2013-04-30 DIAGNOSIS — R002 Palpitations: Secondary | ICD-10-CM

## 2013-04-30 DIAGNOSIS — Z9889 Other specified postprocedural states: Secondary | ICD-10-CM | POA: Insufficient documentation

## 2013-04-30 DIAGNOSIS — R5383 Other fatigue: Secondary | ICD-10-CM | POA: Insufficient documentation

## 2013-04-30 DIAGNOSIS — Z8619 Personal history of other infectious and parasitic diseases: Secondary | ICD-10-CM | POA: Insufficient documentation

## 2013-04-30 DIAGNOSIS — Z862 Personal history of diseases of the blood and blood-forming organs and certain disorders involving the immune mechanism: Secondary | ICD-10-CM | POA: Insufficient documentation

## 2013-04-30 DIAGNOSIS — R5381 Other malaise: Secondary | ICD-10-CM | POA: Insufficient documentation

## 2013-04-30 DIAGNOSIS — Z8742 Personal history of other diseases of the female genital tract: Secondary | ICD-10-CM | POA: Insufficient documentation

## 2013-04-30 DIAGNOSIS — Z8639 Personal history of other endocrine, nutritional and metabolic disease: Secondary | ICD-10-CM | POA: Insufficient documentation

## 2013-04-30 DIAGNOSIS — R42 Dizziness and giddiness: Secondary | ICD-10-CM | POA: Insufficient documentation

## 2013-04-30 DIAGNOSIS — Z8719 Personal history of other diseases of the digestive system: Secondary | ICD-10-CM | POA: Insufficient documentation

## 2013-04-30 LAB — BASIC METABOLIC PANEL
BUN: 16 mg/dL (ref 6–23)
CO2: 25 mEq/L (ref 19–32)
Calcium: 9.3 mg/dL (ref 8.4–10.5)
Chloride: 103 mEq/L (ref 96–112)
Creatinine, Ser: 0.97 mg/dL (ref 0.50–1.10)
GFR calc Af Amer: 81 mL/min — ABNORMAL LOW (ref >90)
GFR calc non Af Amer: 69 mL/min — ABNORMAL LOW (ref >90)
Glucose, Bld: 59 mg/dL — ABNORMAL LOW (ref 70–99)
Potassium: 4.1 mEq/L (ref 3.7–5.3)
Sodium: 141 mEq/L (ref 137–147)

## 2013-04-30 LAB — CBC
HCT: 40.7 % (ref 36.0–46.0)
Hemoglobin: 14 g/dL (ref 12.0–15.0)
MCH: 31 pg (ref 26.0–34.0)
MCHC: 34.4 g/dL (ref 30.0–36.0)
MCV: 90.2 fL (ref 78.0–100.0)
Platelets: 229 10*3/uL (ref 150–400)
RBC: 4.51 MIL/uL (ref 3.87–5.11)
RDW: 12.7 % (ref 11.5–15.5)
WBC: 6.9 10*3/uL (ref 4.0–10.5)

## 2013-04-30 LAB — CBG MONITORING, ED: Glucose-Capillary: 91 mg/dL (ref 70–99)

## 2013-04-30 LAB — I-STAT TROPONIN, ED: Troponin i, poc: 0 ng/mL (ref 0.00–0.08)

## 2013-04-30 MED ORDER — ACETAMINOPHEN 325 MG PO TABS
650.0000 mg | ORAL_TABLET | Freq: Once | ORAL | Status: AC
  Administered 2013-04-30: 650 mg via ORAL
  Filled 2013-04-30: qty 2

## 2013-04-30 NOTE — Progress Notes (Signed)
Called by ER about Ms. Jenna Cortez who has a history of PAF in 2013, converted with 200 mg flecainide and on a "pocket pill" strategy at home. Former patient of Dr. DaleenAce Gins SquibbWall. Thought her HR was slow and irregular this am, not feeling well. Came into ER and rhythm now shows sinus, HR in the 90's. Discussed with Dr. Criss AlvineGoldston and recommended placement of a 2 week [facility] [address] [facility]. Follow-up afterwards with mid-level provider.  She had an exercise stress test in 2013 that was negative for ischemia.  Jenna NoseKenneth C. Hilty, MD, [facility] Attending Cardiologist [facility]

## 2013-04-30 NOTE — ED Provider Notes (Signed)
CSN: [id number]     Arrival date & time 04/30/13  0825 History   First MD Initiated Contact with Patient 04/30/13 [phone]     Chief Complaint  Patient presents with  . Atrial Fibrillation     (Consider location/radiation/quality/duration/timing/severity/associated sxs/prior Treatment) HPI 48 year old female presents after about 15 minutes of palpitations, shortness of breath, lightheadedness, and weakness. Patient has had atrial fibrillation for the past couple years. This is intermittent and is currently treated with daily baby aspirin only. Today she felt lightheaded and short of breath. No chest pain, nausea or diaphoresis. She felt her pulse for 30 seconds and felt an irregular rate of 58. This spontaneously resolved. She now feels normal. This occurred about 2 hours ago.  Past Medical History  Diagnosis Date  . TMJ (dislocation of temporomandibular joint)   . Insomnia   . Depression   . Herpes     HERPES SIMPLEX VIRUS  . Dysplasia of cervix, unspecified     HISTORY OF CERVICAL DYSPLASIA  . Hyperprolactinemia   . BRCA1 negative     NEGATIVE MUTATION 01/05/06  . BRCA2 negative     NEGATIVE MUTATION 01/05/06  . History of cholecystectomy   . Atrial fibrillation   . Anxiety    Past Surgical History  Procedure Laterality Date  . Shoulder surgery      RIGHT ROTATOR CUFF  2000  . Chest surgery      RIGHT CLAVICLE FRACTURE-TRAUMATIC  . Cryotherapy      CERVIX  . Breast surgery      RIGHT BREAST BIOPSY 2001-PAPILOMA/LT BR BXY-BENIGN 2003  . Augmentation mammaplasty      AUGMENTATION 1998  . Laparoscopic cholecystectomy  2011   Family History  Problem Relation Age of Onset  . Breast cancer Mother   . Hypertension Father   . Atrial fibrillation Brother    History  Substance Use Topics  . Smoking status: Never Smoker   . Smokeless tobacco: Never Used  . Alcohol Use: Yes     Comment: OCCASIONALLY   OB History   Grav Para Term Preterm Abortions TAB SAB Ect Mult Living    _0 Review of Systems  Constitutional: Negative for fever and diaphoresis.  Respiratory: Positive for shortness of breath.   Cardiovascular: Positive for palpitations. Negative for chest pain and leg swelling.  Gastrointestinal: Negative for nausea and vomiting.  Genitourinary: Negative for dysuria.  Neurological: Positive for weakness and light-headedness. Negative for syncope.  All other systems reviewed and are negative.      Allergies  Codeine and Sulfonamide derivatives  Home Medications   Current Outpatient Rx  Name  Route  Sig  Dispense  Refill  . aspirin 81 MG tablet   Oral   Take 81 mg by mouth daily.         . diphenhydrAMINE (BENADRYL) 12.5 MG/5ML elixir   Oral   Take 12.5 mg by mouth 4 (four) times daily as needed for allergies.         Marland Kitchen EXPIRED: flecainide (TAMBOCOR) 150 MG tablet   Oral   Take 2 tablets (300 mg total) by mouth daily as needed (Palpitations and irregular heart beat). Please notify [facility] office at [phone] if you require a dose of flecainide before your next clinic visit.   10 tablet   0    BP 104/50  Pulse 82  Temp(Src) 97.5 F (36.4 C) (Oral)  Resp 18  SpO2 100%  LMP 04/04/2013 Physical Exam  Nursing note and vitals reviewed. Constitutional: She is oriented to person, place, and time. She appears well-developed and well-nourished. No distress.  HENT:  Head: Normocephalic and atraumatic.  Right Ear: External ear normal.  Left Ear: External ear normal.  Nose: Nose normal.  Eyes: Right eye exhibits no discharge. Left eye exhibits no discharge.  Cardiovascular: Normal rate, regular rhythm and normal heart sounds.   No murmur heard. Pulmonary/Chest: Effort normal and breath sounds normal.  Abdominal: Soft. She exhibits no distension. There is no tenderness.  Neurological: She is alert and oriented to person, place, and time.  Skin: Skin is warm and dry.    ED Course  Procedures (including  critical care time) Labs Review Labs Reviewed  BASIC METABOLIC PANEL - Abnormal; Notable for the following:    Glucose, Bld 59 (*)    GFR calc non Af Amer 69 (*)    GFR calc Af Amer 81 (*)    All other components within normal limits  CBC  I-STAT TROPOININ, ED  CBG MONITORING, ED   Imaging Review Dg Chest Portable 1 View  04/30/2013   CLINICAL DATA:  History of atrial fibrillation.  EXAM: PORTABLE CHEST - 1 VIEW  COMPARISON:  DG CHEST 1V PORT dated 03/31/2011  FINDINGS: The lungs remain mildly hyperinflated and clear. The cardiopericardial silhouette is normal in size. The pulmonary vascularity is not engorged. The mediastinum is normal in width. There is no pleural effusion or pneumothorax. The observed portions of the bony thorax appear normal.  IMPRESSION: There is mild hyperinflation which may be voluntary or could reflect underlying COPD. There is no evidence of pneumonia nor CHF.   Electronically Signed   By: David  Martinique   On: 04/30/2013 09:22     EKG Interpretation   Date/Time:  Monday April 30 2013 08:29:40 EDT Ventricular Rate:  85 PR Interval:  150 QRS Duration: 62 QT Interval:  340 QTC Calculation: 404 R Axis:   89 Text Interpretation:  Normal sinus rhythm Septal infarct , age  undetermined Abnormal ECG Rhythm is now sinus, otherwise the EKG is  unchanged from 2013 Confirmed by Jailine Lieder  MD, [place] ([id number]) on 04/30/2013  8:38:40 AM      MDM   Final diagnoses:  Palpitations    Patient completely asymptomatic here, has NSR and benign workup currently. I discussed her case with cardiology, Dr. Debara Pickett, who at this time recommends discharge and he will set up Holter monitor as outpatient as well as cardiology follow up. I discussed strict return precautions with patient, and she will follow up as directed. I doubt this is ACS, more consistent with palpitations.     Ephraim Hamburger, MD 04/30/13 [phone]

## 2013-04-30 NOTE — ED Notes (Signed)
Per pt sts that she has a hx of a fib and feels like her heart rate was rapid and irregular this am. sts some SOB and she doesn't feel well. Denies any chest pain.

## 2013-04-30 NOTE — Telephone Encounter (Signed)
Sent message to Triage, this message should had been sent to Triage

## 2013-04-30 NOTE — Discharge Instructions (Signed)
Cardiac Event Monitoring A cardiac event monitor is a small recording device used to help detect abnormal heart rhythms (arrhythmias). The monitor is used to record heart rhythm when noticeable symptoms such as the following occur:  Fast heart beats (palpitations), such as heart racing or fluttering.  Dizziness.  Fainting or lightheadedness.  Unexplained weakness. The monitor is wired to two electrodes placed on your chest. Electrodes are flat, sticky disks that attach to your skin. The monitor can be worn for up to 30 days. You will wear the monitor at all times, except when bathing.  HOW TO USE YOUR CARDIAC EVENT MONITOR A technician will prepare your chest for the electrode placement. The technician will show you how to place the electrodes, how to work the monitor, and how to replace the batteries. Take time to practice using the monitor before you leave the office. Make sure you understand how to send the information from the monitor to your health care provider. This requires a telephone with a landline, not a cellphone. You need to:  Wear your monitor at all times, except when you are in water:  Do not get the monitor wet.  Take the monitor off when bathing. Do not swim or use a hot tub with it on.  Keep your skin clean. Do not put body lotion or moisturizer on your chest.  Change the electrodes daily or any time they stop sticking to your skin. You might need to use tape to keep them on.  It is possible that your skin under the electrodes could become irritated. To keep this from happening, try to put the electrodes in slightly different places on your chest. However, they must remain in the area under your left breast and in the upper right section of your chest.  Make sure the monitor is safely clipped to your clothing or in a location close to your body that your health care provider recommends.  Press the button to record when you feel symptoms of heart trouble, such as  dizziness, weakness, lightheadedness, palpitations, thumping, shortness of breath, unexplained weakness, or a fluttering or racing heart. The monitor is always on and records what happened slightly before you pressed the button, so do not worry about being too late to get good information.  Keep a diary of your activities, such as walking, doing chores, and taking medicine. It is especially important to note what you were doing when you pushed the button to record your symptoms. This will help your health care provider determine what might be contributing to your symptoms. The information stored in your monitor will be reviewed by your health care provider alongside your diary entries.  Send the recorded information as recommended by your health care provider. It is important to understand that it will take some time for your health care provider to process the results.  Change the batteries as recommended by your health care provider. SEEK IMMEDIATE MEDICAL CARE IF:   You have chest pain.  You have extreme difficulty breathing or shortness of breath.  You develop a very fast heartbeat that persists.  You develop dizziness that does not go away .  You faint or constantly feel you are about to faint. Document Released: 10/28/2007 Document Revised: 09/20/2012 Document Reviewed: 07/17/2012 Exit[employer] Patient Information 2014 [employer].  

## 2013-04-30 NOTE — Telephone Encounter (Signed)
New message  Pt called states that she is in AFIB this morning.. Requesting same day appt// No SOB// Please assist

## 2013-05-03 ENCOUNTER — Other Ambulatory Visit: Payer: Self-pay

## 2013-05-03 DIAGNOSIS — Z1231 Encounter for screening mammogram for malignant neoplasm of breast: Secondary | ICD-10-CM

## 2013-05-18 ENCOUNTER — Ambulatory Visit
Admission: RE | Admit: 2013-05-18 | Discharge: 2013-05-18 | Disposition: A | Discharge status: Home / Self Care | Payer: BC Managed Care – PPO | Source: Ambulatory Visit

## 2013-05-18 DIAGNOSIS — Z1231 Encounter for screening mammogram for malignant neoplasm of breast: Secondary | ICD-10-CM

## 2013-05-25 ENCOUNTER — Ambulatory Visit (INDEPENDENT_AMBULATORY_CARE_PROVIDER_SITE_OTHER): Payer: BC Managed Care – PPO | Admitting: Gynecology

## 2013-05-25 ENCOUNTER — Encounter: Payer: Self-pay | Admitting: Gynecology

## 2013-05-25 ENCOUNTER — Other Ambulatory Visit (HOSPITAL_COMMUNITY)
Admission: RE | Admit: 2013-05-25 | Discharge: 2013-05-25 | Disposition: A | Discharge status: Home / Self Care | Payer: BC Managed Care – PPO | Source: Ambulatory Visit | Attending: Gynecology | Admitting: Gynecology

## 2013-05-25 VITALS — BP 112/70 | Ht 63.5 in | Wt 138.0 lb

## 2013-05-25 DIAGNOSIS — N921 Excessive and frequent menstruation with irregular cycle: Secondary | ICD-10-CM

## 2013-05-25 DIAGNOSIS — Z01419 Encounter for gynecological examination (general) (routine) without abnormal findings: Secondary | ICD-10-CM

## 2013-05-25 DIAGNOSIS — Z862 Personal history of diseases of the blood and blood-forming organs and certain disorders involving the immune mechanism: Secondary | ICD-10-CM

## 2013-05-25 DIAGNOSIS — Z1151 Encounter for screening for human papillomavirus (HPV): Secondary | ICD-10-CM | POA: Insufficient documentation

## 2013-05-25 DIAGNOSIS — N920 Excessive and frequent menstruation with regular cycle: Secondary | ICD-10-CM

## 2013-05-25 DIAGNOSIS — Z23 Encounter for immunization: Secondary | ICD-10-CM

## 2013-05-25 DIAGNOSIS — Z8639 Personal history of other endocrine, nutritional and metabolic disease: Secondary | ICD-10-CM

## 2013-05-25 NOTE — Progress Notes (Signed)
Jenna CornsMelissa T Cortez 07/29/1965 [id number]   History:    48 y.o.  Who presented to the office today for her annual gynecological examination. The patient remarried last year. Partner has had a vasectomy. The patient describes her cycles occurring every 24-40 days and the first 3-4 days are very heavy with passage of large clots and cramping. Occasionally she has had some postcoital bleeding. She was recently seen in the emergency room for atrial fibrillation and was kept overnight and released. She is in the process following up with her cardiologist. She was asymptomatic today.  Patient history of colonic polyps in 2010 and also has family history of colon cancer. 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 Patient had cholecystectomy in 2012.   Past medical history,surgical history, family history and social history were all reviewed and documented in the EPIC chart.  Gynecologic History Patient's last menstrual period was 05/01/2013. Contraception: vasectomy Last Pap: 2012. Results were: normal Last mammogram: 2013. Results were: Normal but dense  Obstetric History OB History  Gravida Para Term Preterm AB SAB TAB Ectopic Multiple Living  2 2 2       2     # Outcome Date GA Lbr Len/2nd Weight Sex Delivery Anes PTL Lv  2 TRM     M SVD  N Y  1 TRM     F SVD  N Y       ROS: A ROS was performed and pertinent positives and negatives are included in the history.  GENERAL: No fevers or chills. HEENT: No change in vision, no earache, sore throat or sinus congestion. NECK: No pain or stiffness. CARDIOVASCULAR: No chest pain or pressure. No palpitations. PULMONARY: No shortness of breath, cough or wheeze. GASTROINTESTINAL: No abdominal pain, nausea, vomiting or diarrhea, melena or bright red blood per rectum. GENITOURINARY: No urinary frequency, urgency, hesitancy or dysuria. MUSCULOSKELETAL: No joint or muscle  pain, no back pain, no recent trauma. DERMATOLOGIC: No rash, no itching, no lesions. ENDOCRINE: No polyuria, polydipsia, no heat or cold intolerance. No recent change in weight. HEMATOLOGICAL: No anemia or easy bruising or bleeding. NEUROLOGIC: No headache, seizures, numbness, tingling or weakness. PSYCHIATRIC: No depression, no loss of interest in normal activity or change in sleep pattern.     Exam: chaperone present  BP 112/70  Ht 5' 3.5" (1.613 m)  Wt 138 lb (62.596 kg)  BMI 24.06 kg/m2  LMP 05/01/2013  Body mass index is 24.06 kg/(m^2).  General appearance : Well developed well nourished female. No acute distress HEENT: Neck supple, trachea midline, no carotid bruits, no thyroidmegaly Lungs: Clear to auscultation, no rhonchi or wheezes, or rib retractions  Heart: Regular rate and rhythm, no murmurs or gallops Breast:Examined in sitting and supine position were symmetrical in appearance, no palpable masses or tenderness,  no skin retraction, no nipple inversion, no nipple discharge, no skin discoloration, no axillary or supraclavicular lymphadenopathy Abdomen: no palpable masses or tenderness, no rebound or guarding Extremities: no edema or skin discoloration or tenderness  Pelvic:  Bartholin, Urethra, Skene Glands: Within normal limits             Vagina: No gross lesions or discharge  Cervix: No gross lesions or discharge  Uterus  anteverted, normal size, shape and consistency, non-tender and mobile  Adnexa  Without masses or tenderness  Anus and perineum  normal   Rectovaginal  normal sphincter tone  without palpated masses or tenderness             Hemoccult not indicated     Assessment/Plan:  48 y.o. female for annual exam with a metromenorrhagia and dysmenorrhea. Patient was provided with ligature formation on endometrial ablation/her option technique. She was scheduled sonohysterogram in the next week to rule out any intracavitary defect with possible endometrial biopsy.  We'll check with insurance company for coverage for her ablation. Most of her blood work was done recently in the emergency room. She'll return back next week for the sonohysterogram but in the morning fastings that we can do a fasting lipid profile and will also check her TSH. Many years ago she had a history of hyperprolactinemia was treated with check her prolactin level today as well. She was reminded to schedule her mammogram and to request a 3-D because of gram before had demonstrated dense breast. Pap smear was done today.  Note: This dictation was prepared with  Dragon/digital dictation along withSmart phrase technology. Any transcriptional errors that result from this process are unintentional.   Ok EdwardsJuan H Wayden Schwertner MD, 5:16 PM 05/25/2013

## 2013-05-25 NOTE — Patient Instructions (Signed)
Tetanus, Diphtheria (Td) Vaccine What You Need to Know WHY GET VACCINATED? Tetanus  and diphtheria are very serious diseases. They are rare in the [place] today, but people who do become infected often have severe complications. Td vaccine is used to protect adolescents and adults from both of these diseases. Both tetanus and diphtheria are infections caused by bacteria. Diphtheria spreads from person to person through coughing or sneezing. Tetanus-causing bacteria enter the body through cuts, scratches, or wounds. TETANUS (Lockjaw) causes painful muscle tightening and stiffness, usually all over the body.  It can lead to tightening of muscles in the head and neck so you can't open your mouth, swallow, or sometimes even breathe. Tetanus kills about 1 out of every 5 people who are infected. DIPHTHERIA can cause a thick coating to form in the back of the throat.  It can lead to breathing problems, paralysis, heart failure, and death. Before vaccines, the [place] saw as many as 200,000 cases a year of diphtheria and hundreds of cases of tetanus. Since vaccination began, cases of both diseases have dropped by about 99%. TD VACCINE Td vaccine can protect adolescents and adults from tetanus and diphtheria. Td is usually given as a booster dose every 10 years but it can also be given earlier after a severe and dirty wound or burn. Your doctor can give you more information. Td may safely be given at the same time as other vaccines. SOME PEOPLE SHOULD NOT GET THIS VACCINE  If you ever had a life-threatening allergic reaction after a dose of any tetanus or diphtheria containing vaccine, OR if you have a severe allergy to any part of this vaccine, you should not get Td. Tell your doctor if you have any severe allergies.  Talk to your doctor if you:  have epilepsy or another nervous system problem,  had severe pain or swelling after any vaccine containing diphtheria or tetanus,  ever had  Guillain Barr Syndrome (GBS),  aren't feeling well on the day the shot is scheduled. RISKS OF A VACCINE REACTION With a vaccine, like any medicine, there is a chance of side effects. These are usually mild and go away on their own. Serious side effects are also possible, but are very rare. Most people who get Td vaccine do not have any problems with it. Mild Problems  following Td (Did not interfere with activities)  Pain where the shot was given (about 8 people in 10)  Redness or swelling where the shot was given (about 1 person in 3)  Mild fever (about 1 person in 15)  Headache or Tiredness (uncommon) Moderate Problems following Td (Interfered with activities, but did not require medical attention)  Fever over 102 F (38.9 C) (rare) Severe Problems  following Td (Unable to perform usual activities; required medical attention)  Swelling, severe pain, bleeding, or redness in the arm where the shot was given (rare). Problems that could happen after any vaccine:  Brief fainting spells can happen after any medical procedure, including vaccination. Sitting or lying down for about 15 minutes can help prevent fainting, and injuries caused by a fall. Tell your doctor if you feel dizzy, or have vision changes or ringing in the ears.  Severe shoulder pain and reduced range of motion in the arm where a shot was given can happen, very rarely, after a vaccination.  Severe allergic reactions from a vaccine are very rare, estimated at less than 1 in a million doses. If one were to occur, it would   usually be within a few minutes to a few hours after the vaccination. WHAT IF THERE IS A SERIOUS REACTION? What should I look for?  Look for anything that concerns you, such as signs of a severe allergic reaction, very high fever, or behavior changes. Signs of a severe allergic reaction can include hives, swelling of the face and throat, difficulty breathing, a fast heartbeat, dizziness, and  weakness. These would usually start a few minutes to a few hours after the vaccination. What should I do?  If you think it is a severe allergic reaction or other emergency that can't wait, call 911 or get the person to the nearest hospital. Otherwise, call your doctor.  Afterward, the reaction should be reported to the Vaccine Adverse Event Reporting System (VAERS). Your doctor might file this report, or, you can do it yourself through the VAERS website or by calling 1-800-822-7967. VAERS is only for reporting reactions. They do not give medical advice. THE NATIONAL VACCINE INJURY COMPENSATION PROGRAM The National Vaccine Injury Compensation Program (VICP) is a federal program that was created to compensate people who may have been injured by certain vaccines. Persons who believe they may have been injured by a vaccine can learn about the program and about filing a claim by calling 1-800-338-2382 or visiting the VICP website. HOW CAN I LEARN MORE?  Ask your doctor.  Contact your local or state health department.  Contact the Centers for Disease Control and Prevention (CDC):  Call 1-800-232-4636 (1-800-CDC-INFO)  Visit CDC's vaccines website CDC Td Vaccine Interim VIS (03/07/12) Document Released: 11/15/2005 Document Revised: 05/15/2012 Document Reviewed: 05/10/2012 [employer] Patient Information 2014 [employer]. Transvaginal Ultrasound Transvaginal ultrasound is a pelvic ultrasound, using a metal probe that is placed in the vagina, to look at a women's female organs. Transvaginal ultrasound is a method of seeing inside the pelvis of a woman. The ultrasound machine sends out sound waves from the transducer (probe). These sound waves bounce off body structures (like an echo) to create a picture. The picture shows up on a monitor. It is called transvaginal because the probe is inserted into the vagina. There should be very little discomfort from the vaginal probe. This test can also be used  during pregnancy. Endovaginal ultrasound is another name for a transvaginal ultrasound. In a transabdominal ultrasound, the probe is placed on the outside of the belly. This method gives pictures that are lower quality than pictures from the transvaginal technique. Transvaginal ultrasound is used to look for problems of the female genital tract. Some such problems include:  Infertility problems.  Congenital (birth defect) malformations of the uterus and ovaries.  Tumors in the uterus.  Abnormal bleeding.  Ovarian tumors and cysts.  Abscess (inflamed tissue around pus) in the pelvis.  Unexplained abdominal or pelvic pain.  Pelvic infection. DURING PREGNANCY, TRANSVAGINAL ULTRASOUND MAY BE USED TO LOOK AT:  Normal pregnancy.  Ectopic pregnancy (pregnancy outside the uterus).  Fetal heartbeat.  Abnormalities in the pelvis, that are not seen well with transabdominal ultrasound.  Suspected twins or multiples.  Impending miscarriage.  Problems with the cervix (incompetent cervix, not able to stay closed and hold the baby).  When doing an amniocentesis (removing fluid from the pregnancy sac, for testing).  Looking for abnormalities of the baby.  Checking the growth, development, and age of the fetus.  Measuring the amount of fluid in the amniotic sac.  When doing an external version of the baby (moving baby into correct position).  Evaluating the baby for   problems in high risk pregnancies (biophysical profile).  Suspected fetal demise (death). Sometimes a special ultrasound method called Saline Infusion Sonography (SIS) is used for a more accurate look at the uterus. Sterile saline (salt water) is injected into the uterus of non-pregnant patients to see the inside of the uterus better. SIS is not used on pregnant women. The vaginal probe can also assist in obtaining biopsies of abnormal areas, in draining fluid from cysts on the ovary, and in finding IUDs (intrauterine  device, birth control) that cannot be located. PREPARATION FOR TEST A transvaginal ultrasound is done with the bladder empty. The transabdominal ultrasound is done with your bladder full. You may be asked to drink several glasses of water before that exam. Sometimes, a transabdominal ultrasound is done just after a transvaginal ultrasound, to look at organs in your abdomen. PROCEDURE  You will lie down on a table, with your knees bent and your feet in foot holders. The probe is covered with a condom. A sterile lubricant is put into the vagina and on the probe. The lubricant helps transmit the sound waves and avoid irritating the vagina. Your caregiver will move the probe inside the vaginal cavity to scan the pelvic structures. A normal test will show a normal pelvis and normal contents. An abnormal test will show abnormalities of the pelvis, placenta, or baby. ABNORMAL RESULTS MAY BE DUE TO:  Growths or tumors in the:  Uterus.  Ovaries.  Vagina.  Other pelvic structures.  Non-cancerous growths of the uterus and ovaries.  Twisting of the ovary, cutting off blood supply to the ovary (ovarian torsion).  Areas of infection, including:  Pelvic inflammatory disease.  Abscess in the pelvis.  Locating an IUD. PROBLEMS FOUND IN PREGNANT WOMEN MAY INCLUDE:  Ectopic pregnancy (pregnancy outside the uterus).  Multiple pregnancies.  Early dilation (opening) of the cervix. This may indicate an incompetent cervix and early delivery.  Impending miscarriage.  Fetal death.  Problems with the placenta, including:  Placenta has grown over the opening of the womb (placenta previa).  Placenta has separated early in the womb (placental abruption).  Placenta grows into the muscle of the uterus (placenta accreta).  Tumors of pregnancy, including gestational trophoblastic disease. This is an abnormal pregnancy, with no fetus. The uterus is filled with many grape-like cysts that could sometimes  be cancerous.  Incorrect position of the fetus (breech, vertex).  Intrauterine fetal growth retardation (IUGR) (poor growth in the womb).  Fetal abnormalities or infection. RISKS AND COMPLICATIONS There are no known risks to the ultrasound procedure. There is no X-ray used when doing an ultrasound. Document Released: 12/31/2003 Document Revised: 04/12/2011 Document Reviewed: 12/18/2008 Exit[employer] Patient Information 2014 [employer].  

## 2013-05-30 ENCOUNTER — Telehealth: Payer: Self-pay

## 2013-05-30 NOTE — Telephone Encounter (Signed)
Dr. Glenetta HewJF asked me to check ins benefits for Her Option Ablation. I spoke with Joni ReiningNicole at her ins co and was informed CPT [id number] is billable code/no prior authorization needed.  Patient has a $40 copayment then ins pays 100%.  (Call ref #[id number])  I called patient and left this info on her cell ph voice mail.

## 2013-06-15 ENCOUNTER — Ambulatory Visit (INDEPENDENT_AMBULATORY_CARE_PROVIDER_SITE_OTHER): Payer: BC Managed Care – PPO

## 2013-06-15 ENCOUNTER — Encounter: Payer: BC Managed Care – PPO | Admitting: Physician Assistant

## 2013-06-15 ENCOUNTER — Ambulatory Visit (INDEPENDENT_AMBULATORY_CARE_PROVIDER_SITE_OTHER): Payer: BC Managed Care – PPO | Admitting: Gynecology

## 2013-06-15 ENCOUNTER — Other Ambulatory Visit: Payer: Self-pay | Admitting: Gynecology

## 2013-06-15 DIAGNOSIS — N921 Excessive and frequent menstruation with irregular cycle: Secondary | ICD-10-CM

## 2013-06-15 DIAGNOSIS — N920 Excessive and frequent menstruation with regular cycle: Secondary | ICD-10-CM

## 2013-06-15 DIAGNOSIS — N840 Polyp of corpus uteri: Secondary | ICD-10-CM

## 2013-06-15 DIAGNOSIS — J209 Acute bronchitis, unspecified: Secondary | ICD-10-CM

## 2013-06-15 MED ORDER — AMOXICILLIN-POT CLAVULANATE 875-125 MG PO TABS: 1.0000 | ORAL_TABLET | Freq: Two times a day (BID) | ORAL | Status: DC

## 2013-06-15 MED ORDER — BENZONATATE 100 MG PO CAPS: 100.0000 mg | ORAL_CAPSULE | Freq: Three times a day (TID) | ORAL | Status: DC | PRN

## 2013-06-15 NOTE — Progress Notes (Signed)
Jenna Cortez is an 48 y.o. female. Had presented to the office today for further evaluation of her menorrhagia and for sonohysterogram.The patient describes her cycles occurring every 24-40 days and the first 3-4 days are very heavy with passage of large clots and cramping. Occasionally she has had some postcoital bleeding. Patient had previously been provided with information on endometrial ablation depending on findings from the sonohysterogram. The results of the sonohysterogram today demonstrated the following:  Uterus measuring 9.3 x 6.2 x 4.8 cm with endometrial stripe of 9.6 mm. Ovaries appeared to be normal a left adnexal solid avascular area separate from the left ovary were seen measuring 21 x 17 x 11 mm questionable fibroid. No free fluid in the cul-de-sac seen. After the cervix was cleansed with Betadine solution a sterile Pipelle was introduced into the uterine cavity and normal saline was instilled. A posterior uterine polyp measuring 25 x 7 mm was noted. The patient will be scheduled for resectoscopic polypectomy in an outpatient setting. Patient's recent Pap smear was normal.  The patient was having nonproductive cough which she says she said for about 3 weeks. Patient had seen her primary doctor a month ago and had a normal chest x-ray since she has had history of A. fib in the past.   Pertinent Gynecological History: Menses: Heavy menstrual cycle with passage of large clots Bleeding: Same as above Contraception: vasectomy DES exposure: denies Blood transfusions: none Sexually transmitted diseases: HSV Previous GYN Procedures: 2 normal spontaneous vaginal deliveries  Last mammogram: normal Date: 2013 Last pap: normal Date: 2015 OB History: G 2, P 2   Menstrual History: Menarche age: 51 Patient's last menstrual period was 05/01/2013.    Past Medical History  Diagnosis Date  . TMJ (dislocation of temporomandibular joint)   . Insomnia   . Depression   . Herpes    HERPES SIMPLEX VIRUS  . Dysplasia of cervix, unspecified     HISTORY OF CERVICAL DYSPLASIA  . Hyperprolactinemia   . BRCA1 negative     NEGATIVE MUTATION 01/05/06  . BRCA2 negative     NEGATIVE MUTATION 01/05/06  . History of cholecystectomy   . Atrial fibrillation   . Anxiety     Past Surgical History  Procedure Laterality Date  . Shoulder surgery      RIGHT ROTATOR CUFF  2000  . Chest surgery      RIGHT CLAVICLE FRACTURE-TRAUMATIC  . Cryotherapy      CERVIX  . Breast surgery      RIGHT BREAST BIOPSY 2001-PAPILOMA/LT BR BXY-BENIGN 2003  . Augmentation mammaplasty      AUGMENTATION 1998  . Laparoscopic cholecystectomy  2011    Family History  Problem Relation Age of Onset  . Breast cancer Mother   . Hypertension Father   . Atrial fibrillation Brother     Social History:  reports that she has never smoked. She has never used smokeless tobacco. She reports that she drinks alcohol. She reports that she does not use illicit drugs.  Allergies:  Allergies  Allergen Reactions  . Codeine Itching  . Sulfonamide Derivatives Rash     (Not in a hospital admission)  REVIEW OF SYSTEMS: A ROS was performed and pertinent positives and negatives are included in the history.  GENERAL: No fevers or chills. HEENT: No change in vision, no earache, sore throat or sinus congestion. NECK: No pain or stiffness. CARDIOVASCULAR: No chest pain or pressure. No palpitations. PULMONARY: Nonproductive cough GASTROINTESTINAL: No abdominal pain, nausea, vomiting  or diarrhea, melena or bright red blood per rectum. GENITOURINARY: No urinary frequency, urgency, hesitancy or dysuria. MUSCULOSKELETAL: No joint or muscle pain, no back pain, no recent trauma. DERMATOLOGIC: No rash, no itching, no lesions. ENDOCRINE: No polyuria, polydipsia, no heat or cold intolerance. No recent change in weight. HEMATOLOGICAL: No anemia or easy bruising or bleeding. NEUROLOGIC: No headache, seizures, numbness, tingling or  weakness. PSYCHIATRIC: No depression, no loss of interest in normal activity or change in sleep pattern.     Last menstrual period 05/01/2013.  Physical Exam:  HEENT:unremarkable Neck:Supple, midline, no thyroid megaly, no carotid bruits Lungs:  Inspiratory rhonchi in both lower  lung fields murmurs or gallops Breast Exam: At time of recent exam her breasts were normal Abdomen: Soft nontender no rebound or guarding Pelvic:BUS within normal limits Vagina: No lesions or discharge Cervix: No lesions or discharge Uterus: Anteverted normal size shape and consistency nontender Adnexa: No palpable mass or tenderness Extremities: No cords, no edema Rectal: Deferred   Assessment/Plan: Was found on sonohysterogram to have a large endometrial polyp which has been a contributing factor to her menorrhagia and cramping. Patient will be scheduled for resectoscopic polypectomy as an outpatient. Patient will be treated for bronchitis with Augmentin 875 one by mouth twice a day for 7 days and for cough suppressant she'll be started on Tessalon Perles 100 mg 1 by mouth 3 times a day. The following risks were discussed with the patient in reference to her surgery:                        Patient was counseled as to the risk of surgery to include the following:  1. Infection (prohylactic antibiotics will be administered)  2. DVT/Pulmonary Embolism (prophylactic pneumo compression stockings will be used)  3.Trauma to internal organs requiring additional surgical procedure to repair any injury to     Internal organs requiring perhaps additional hospitalization days.  4.Hemmorhage requiring transfusion and blood products which carry risks such as anaphylactic reaction, hepatitis and AIDS  Patient had received literature information on the procedure scheduled and all her questions were answered and fully accepts all risk.   Juan H FernandezMD10:40 AMTD@Note : This dictation was prepared with   Dragon/digital dictation along Games developer. Any transcriptional errors that result from this process are unintentional.      Terrance Mass 06/15/2013, 10:24 AM  Note: This dictation was prepared with  Dragon/digital dictation along withSmart phrase technology. Any transcriptional errors that result from this process are unintentional.

## 2013-06-15 NOTE — Patient Instructions (Signed)

## 2013-06-16 LAB — LIPID PANEL
Cholesterol: 162 mg/dL (ref 0–200)
HDL: 70 mg/dL (ref >39)
LDL Cholesterol: 81 mg/dL (ref 0–99)
Total CHOL/HDL Ratio: 2.3 Ratio
Triglycerides: 57 mg/dL (ref <150)
VLDL: 11 mg/dL (ref 0–40)

## 2013-06-16 LAB — TSH: TSH: 3.338 u[IU]/mL (ref 0.350–4.500)

## 2013-06-16 LAB — PROLACTIN: Prolactin: 12.4 ng/mL

## 2013-06-19 NOTE — H&P (Signed)
Jenna Cortez is an 48 y.o. female. Had presented to the office today for further evaluation of her menorrhagia and for sonohysterogram.The patient describes her cycles occurring every 24-40 days and the first 3-4 days are very heavy with passage of large clots and cramping. Occasionally she has had some postcoital bleeding. Patient had previously been provided with information on endometrial ablation depending on findings from the sonohysterogram. The results of the sonohysterogram today demonstrated the following:  Uterus measuring 9.3 x 6.2 x 4.8 cm with endometrial stripe of 9.6 mm. Ovaries appeared to be normal a left adnexal solid avascular area separate from the left ovary were seen measuring 21 x 17 x 11 mm questionable fibroid. No free fluid in the cul-de-sac seen. After the cervix was cleansed with Betadine solution a sterile Pipelle was introduced into the uterine cavity and normal saline was instilled. A posterior uterine polyp measuring 25 x 7 mm was noted. The patient will be scheduled for resectoscopic polypectomy in an outpatient setting. Patient's recent Pap smear was normal.  The patient was having nonproductive cough which she says she said for about 3 weeks. Patient had seen her primary doctor a month ago and had a normal chest x-ray since she has had history of A. fib in the past.  Pertinent Gynecological History:  Menses: Heavy menstrual cycle with passage of large clots  Bleeding: Same as above  Contraception: vasectomy  DES exposure: denies  Blood transfusions: none  Sexually transmitted diseases: HSV  Previous GYN Procedures: 2 normal spontaneous vaginal deliveries  Last mammogram: normal Date: 2013  Last pap: normal Date: 2015  OB History: G 2, P 2  Menstrual History:  Menarche age: 43  Patient's last menstrual period was 05/01/2013.  Past Medical History   Diagnosis  Date   .  TMJ (dislocation of temporomandibular joint)    .  Insomnia    .  Depression    .  Herpes       HERPES SIMPLEX VIRUS   .  Dysplasia of cervix, unspecified      HISTORY OF CERVICAL DYSPLASIA   .  Hyperprolactinemia    .  BRCA1 negative      NEGATIVE MUTATION 01/05/06   .  BRCA2 negative      NEGATIVE MUTATION 01/05/06   .  History of cholecystectomy    .  Atrial fibrillation    .  Anxiety     Past Surgical History   Procedure  Laterality  Date   .  Shoulder surgery       RIGHT ROTATOR CUFF 2000   .  Chest surgery       RIGHT CLAVICLE FRACTURE-TRAUMATIC   .  Cryotherapy       CERVIX   .  Breast surgery       RIGHT BREAST BIOPSY 2001-PAPILOMA/LT BR BXY-BENIGN 2003   .  Augmentation mammaplasty       AUGMENTATION 1998   .  Laparoscopic cholecystectomy   2011    Family History   Problem  Relation  Age of Onset   .  Breast cancer  Mother    .  Hypertension  Father    .  Atrial fibrillation  Brother    Social History: reports that she has never smoked. She has never used smokeless tobacco. She reports that she drinks alcohol. She reports that she does not use illicit drugs.  Allergies:  Allergies   Allergen  Reactions   .  Codeine  Itching   .  Sulfonamide Derivatives  Rash   (Not in a hospital admission)  REVIEW OF SYSTEMS: A ROS was performed and pertinent positives and negatives are included in the history.  GENERAL: No fevers or chills. HEENT: No change in vision, no earache, sore throat or sinus congestion. NECK: No pain or stiffness. CARDIOVASCULAR: No chest pain or pressure. No palpitations. PULMONARY: Nonproductive cough GASTROINTESTINAL: No abdominal pain, nausea, vomiting or diarrhea, melena or bright red blood per rectum. GENITOURINARY: No urinary frequency, urgency, hesitancy or dysuria. MUSCULOSKELETAL: No joint or muscle pain, no back pain, no recent trauma. DERMATOLOGIC: No rash, no itching, no lesions. ENDOCRINE: No polyuria, polydipsia, no heat or cold intolerance. No recent change in weight. HEMATOLOGICAL: No anemia or easy bruising or bleeding. NEUROLOGIC:  No headache, seizures, numbness, tingling or weakness. PSYCHIATRIC: No depression, no loss of interest in normal activity or change in sleep pattern.  Last menstrual period 05/01/2013.  Physical Exam:  HEENT:unremarkable  Neck:Supple, midline, no thyroid megaly, no carotid bruits  Lungs: Inspiratory rhonchi in both lower lung fields murmurs or gallops  Breast Exam: At time of recent exam her breasts were normal  Abdomen: Soft nontender no rebound or guarding  Pelvic:BUS within normal limits  Vagina: No lesions or discharge  Cervix: No lesions or discharge  Uterus: Anteverted normal size shape and consistency nontender  Adnexa: No palpable mass or tenderness  Extremities: No cords, no edema  Rectal: Deferred  Assessment/Plan:  Was found on sonohysterogram to have a large endometrial polyp which has been a contributing factor to her menorrhagia and cramping. Patient will be scheduled for resectoscopic polypectomy as an outpatient. Patient will be treated for bronchitis with Augmentin 875 one by mouth twice a day for 7 days and for cough suppressant she'll be started on Tessalon Perles 100 mg 1 by mouth 3 times a day. The following risks were discussed with the patient in reference to her surgery:  Patient was counseled as to the risk of surgery to include the following:  1. Infection (prohylactic antibiotics will be administered)  2. DVT/Pulmonary Embolism (prophylactic pneumo compression stockings will be used)  3.Trauma to internal organs requiring additional surgical procedure to repair any injury to  Internal organs requiring perhaps additional hospitalization days.  4.Hemmorhage requiring transfusion and blood products which carry risks such as anaphylactic reaction, hepatitis and AIDS  Patient had received literature information on the procedure scheduled and all her questions were answered and fully accepts all risk.  Juan H FernandezMD10:40 AMTD_0 : This dictation was prepared with  Dragon/digital dictation along withSmart phrase technology. Any transcriptional errors that result from this process are unintentional.

## 2013-07-06 ENCOUNTER — Encounter: Payer: Self-pay | Admitting: Physician Assistant

## 2013-07-06 ENCOUNTER — Telehealth: Payer: Self-pay

## 2013-07-06 ENCOUNTER — Encounter: Payer: Self-pay | Admitting: Radiology

## 2013-07-06 ENCOUNTER — Encounter (INDEPENDENT_AMBULATORY_CARE_PROVIDER_SITE_OTHER): Payer: Self-pay

## 2013-07-06 ENCOUNTER — Ambulatory Visit (INDEPENDENT_AMBULATORY_CARE_PROVIDER_SITE_OTHER): Payer: BC Managed Care – PPO | Admitting: Physician Assistant

## 2013-07-06 ENCOUNTER — Encounter (INDEPENDENT_AMBULATORY_CARE_PROVIDER_SITE_OTHER): Payer: BC Managed Care – PPO

## 2013-07-06 VITALS — BP 128/80 | HR 76 | Ht 63.5 in | Wt 140.0 lb

## 2013-07-06 DIAGNOSIS — I4891 Unspecified atrial fibrillation: Secondary | ICD-10-CM

## 2013-07-06 DIAGNOSIS — R002 Palpitations: Secondary | ICD-10-CM

## 2013-07-06 MED ORDER — METOPROLOL SUCCINATE ER 25 MG PO TB24: ORAL_TABLET | ORAL | Status: DC

## 2013-07-06 NOTE — Telephone Encounter (Signed)
Patient is scheduled for D&C, Hysteroscopy w Myosure because of a endo polyp seen at [facility].  1.  Patient asks if this turned out to be "something bad" and hysterectomy would be treatment why not just go ahead and do hysterectomy.  2.  Patient asks if ablation can be scheduled at the same time as this surgery or would that have to be a separate appointment?

## 2013-07-06 NOTE — Progress Notes (Signed)
Patient ID: Jenna Cortez, female   DOB: Sep 30, 1965, 48 y.o.   MRN: [id number] E cardio 30 day monitor applied

## 2013-07-06 NOTE — Telephone Encounter (Signed)
History is not indicated for an endometrial polyp is invasive procedure. A resectoscopic polypectomy with concurrent endometrial oblation can be done. If she would like we could combine both. We would add this on the permit to do the ablation resectoscopic with the wire loop

## 2013-07-06 NOTE — Progress Notes (Signed)
Cardiology Office Note   Date:  07/06/2013   ID:  Jenna Cortez, DOB [birth year], MRN [id number]  PCP:  Annye Asa, MD  Cardiologist:  Dr. Jenell Milliner => will establish with Dr. Thompson Grayer      History of Present Illness: Jenna Cortez is a 48 y.o. female with a hx of paroxysmal AFib.  She was admitted in 03/2011 with AFib with RVR.  She converted with Flecainide 200 mg.  She was given a Rx for prn Flecainide.  Last seen by Dr. Jenell Milliner in 06/2011.  She was recently seen in the ED in 04/2013 with feelings of slow HR.  She was in NSR in the ED with HR in the 90s.  ED physician reviewed with Dr. Debara Pickett who recommended an event monitor.  She returns for follow up.    The day she went to the ED, she felt that she was back in AFib.  It lasted maybe 10 minutes.  She felt lightheaded/near-syncopal.  She did not take Flecainide.  She has not had a recurrence.  However, she has had frequent palpitations since she was seen in 2013.  These have increased in frequency.  She denies chest pain, dyspnea, orthopnea, PND, edema.     Studies:  - Echo (04/01/11):  EF 60% to 65%. Wall motion was normal.  Normal RV size and function  - ETT (04/2011):  normal - no evidence of ischemia by ST analysis   Recent Labs: 04/30/2013: Creatinine 0.97; Hemoglobin 14.0; Potassium 4.1  06/15/2013: HDL Cholesterol by NMR 70; LDL (calc) 81; TSH 3.338   Wt Readings from Last 3 Encounters:  07/06/13 140 lb (63.504 kg)  05/25/13 138 lb (62.596 kg)  07/29/11 135 lb (61.236 kg)     Past Medical History  Diagnosis Date  . TMJ (dislocation of temporomandibular joint)   . Insomnia   . Depression   . Herpes     HERPES SIMPLEX VIRUS  . Dysplasia of cervix, unspecified     HISTORY OF CERVICAL DYSPLASIA  . Hyperprolactinemia   . BRCA1 negative     NEGATIVE MUTATION 01/05/06  . BRCA2 negative     NEGATIVE MUTATION 01/05/06  . History of cholecystectomy   . Atrial fibrillation   . Anxiety     Current  Outpatient Prescriptions  Medication Sig Dispense Refill  . aspirin 81 MG tablet Take 81 mg by mouth daily.       No current facility-administered medications for this visit.    Allergies:   Codeine and Sulfonamide derivatives   Social History:  The patient  reports that she has never smoked. She has never used smokeless tobacco. She reports that she drinks alcohol. She reports that she does not use illicit drugs.   Family History:  The patient's family history includes Atrial fibrillation in her brother; Breast cancer in her mother; Hypertension in her father.   ROS:  Please see the history of present illness.      All other systems reviewed and negative.   PHYSICAL EXAM: VS:  BP 128/80  Pulse 76  Ht 5' 3.5" (1.613 m)  Wt 140 lb (63.504 kg)  BMI 24.41 kg/m2 Well nourished, well developed, in no acute distress HEENT: normal Neck: no JVD Cardiac:  normal S1, S2; RRR; no murmur Lungs:  clear to auscultation bilaterally, no wheezing, rhonchi or rales Abd: soft, nontender, no hepatomegaly Ext: no edema Skin: warm and dry Neuro:  CNs 2-12 intact, no focal abnormalities noted  EKG:  NSR, HR 76, normal axis, no ST changes     ASSESSMENT AND PLAN:  1. Palpitations:  She has a lot of palpitations that sound more c/w PVC/PACs.  However, she has also had symptoms that remind her of her AFib.  Recent TSH was normal.  Echo in 2013 demonstrated normal LV size and function.  I will arrange a 21 day event monitor to further assess her palpitations.  I will give her a Rx for Toprol XL 12.5-25 mg QD prn palpitations.   2. Paroxysmal Atrial Fibrillation:  Her brother has AFib.  He has undergone an ablation in the past.  Arrange event monitor as noted.  Arrange follow up with Dr. Thompson Grayer.  CHADS2-VASc=0.    3. Disposition:  F/u with Dr. Thompson Grayer in 6 weeks after her monitor.   Signed, Versie Starks, MHS 07/06/2013 9:10 AM    [facility] [address],  [place], [place]  [address] Phone: [phone]; Fax: [phone]

## 2013-07-06 NOTE — Patient Instructions (Signed)
Your physician has recommended that you wear an event monitor. Event monitors are medical devices that record the heart's electrical activity. Doctors most often [place] these monitors to diagnose arrhythmias. Arrhythmias are problems with the speed or rhythm of the heartbeat. The monitor is a small, portable device. You can wear one while you do your normal daily activities. This is usually used to diagnose what is causing palpitations/syncope (passing out).  Your physician recommends that you schedule a follow-up appointment on: 08/24/13 @ 9:15 WITH DR. ALLRED  Your physician has recommended you make the following change in your medication:  1. START TOPROL XL 25 MG TABLET; YOU MAY TAKE 1/2 TO 1 WHOLE TABLET DAILY AS NEEDED FOR PALPITATIONS; IF AFTER 30 MINUTES OF TAKING THE TOPROL AND PALPITATIONS HAVE NOT GONE AWAY PLEASE CALL THE OFFICE [phone] FOR ADVISE

## 2013-07-06 NOTE — Telephone Encounter (Signed)
Patient informed.  Jenna Cortez will call her Mon am to schedule pre op consult to discuss possible doing ablation at time of surgery.

## 2013-07-13 ENCOUNTER — Ambulatory Visit (INDEPENDENT_AMBULATORY_CARE_PROVIDER_SITE_OTHER): Payer: BC Managed Care – PPO | Admitting: Gynecology

## 2013-07-13 ENCOUNTER — Encounter: Payer: Self-pay | Admitting: Gynecology

## 2013-07-13 VITALS — BP 120/82

## 2013-07-13 DIAGNOSIS — N840 Polyp of corpus uteri: Secondary | ICD-10-CM

## 2013-07-13 DIAGNOSIS — N949 Unspecified condition associated with female genital organs and menstrual cycle: Secondary | ICD-10-CM

## 2013-07-13 DIAGNOSIS — N925 Other specified irregular menstruation: Secondary | ICD-10-CM

## 2013-07-13 DIAGNOSIS — N938 Other specified abnormal uterine and vaginal bleeding: Secondary | ICD-10-CM

## 2013-07-13 DIAGNOSIS — Z01818 Encounter for other preprocedural examination: Secondary | ICD-10-CM

## 2013-07-13 MED ORDER — METOCLOPRAMIDE HCL 10 MG PO TABS: 10.0000 mg | ORAL_TABLET | Freq: Three times a day (TID) | ORAL | Status: DC

## 2013-07-13 MED ORDER — IBUPROFEN 800 MG PO TABS: 800.0000 mg | ORAL_TABLET | Freq: Three times a day (TID) | ORAL | Status: DC | PRN

## 2013-07-13 MED ORDER — KETOROLAC TROMETHAMINE 10 MG PO TABS: 10.0000 mg | ORAL_TABLET | Freq: Four times a day (QID) | ORAL | Status: DC | PRN

## 2013-07-13 MED ORDER — PROGESTERONE MICRONIZED 200 MG PO CAPS: ORAL_CAPSULE | ORAL | Status: DC

## 2013-07-13 NOTE — Progress Notes (Signed)
Jenna Cortez is an 48 y.o. female. Had presented to the office today for further evaluation of her menorrhagia and for sonohysterogram.The patient describes her cycles occurring every 24-40 days and the first 3-4 days are very heavy with passage of large clots and cramping. Occasionally she has had some postcoital bleeding. Patient had previously been provided with information on endometrial ablation depending on findings from the sonohysterogram.   The results of the sonohysterogram today demonstrated the following:  Uterus measuring 9.3 x 6.2 x 4.8 cm with endometrial stripe of 9.6 mm. Ovaries appeared to be normal a left adnexal solid avascular area separate from the left ovary were seen measuring 21 x 17 x 11 mm questionable fibroid. No free fluid in the cul-de-sac seen.   After the cervix was cleansed with Betadine solution a sterile Pipelle was introduced into the uterine cavity and normal saline was instilled. A posterior uterine polyp measuring 25 x 7 mm was noted.  The patient and scheduled for resectoscopic polypectomy endometrial ablation in an outpatient setting later this month.   Patient's recent Pap smear was normal.   Pertinent Gynecological History:  Menses: Heavy menstrual cycle with passage of large clots  Bleeding: Same as above  Contraception: vasectomy  DES exposure: denies  Blood transfusions: none  Sexually transmitted diseases: HSV  Previous GYN Procedures: 2 normal spontaneous vaginal deliveries  Last mammogram: normal Date: 2013  Last pap: normal Date: 2015  OB History: G 2, P 2   Menstrual History:  Menarche age: 25  Patient's last menstrual period was 05/01/2013.  Past Medical History   Diagnosis  Date   .  TMJ (dislocation of temporomandibular joint)    .  Insomnia    .  Depression    .  Herpes      HERPES SIMPLEX VIRUS   .  Dysplasia of cervix, unspecified      HISTORY OF CERVICAL DYSPLASIA   .  Hyperprolactinemia    .  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  BRCA2 negative      NEGATIVE MUTATION 01/05/06   .  History of cholecystectomy    .  Atrial fibrillation    .  Anxiety     Past Surgical History   Procedure  Laterality  Date   .  Shoulder surgery       RIGHT ROTATOR CUFF 2000   .  Chest surgery       RIGHT CLAVICLE FRACTURE-TRAUMATIC   .  Cryotherapy       CERVIX   .  Breast surgery       RIGHT BREAST BIOPSY 2001-PAPILOMA/LT BR BXY-BENIGN 2003   .  Augmentation mammaplasty       AUGMENTATION 1998   .  Laparoscopic cholecystectomy   2011    Family History   Problem  Relation  Age of Onset   .  Breast cancer  Mother    .  Hypertension  Father    .  Atrial fibrillation  Brother    Social History: reports that she has never smoked. She has never used smokeless tobacco. She reports that she drinks alcohol. She reports that she does not use illicit drugs.  Allergies:  Allergies   Allergen  Reactions   .  Codeine  Itching   .  Sulfonamide Derivatives  Rash   (Not in a hospital admission)  REVIEW OF SYSTEMS: A ROS was performed and pertinent positives and negatives are included in the  history.  GENERAL: No fevers or chills. HEENT: No change in vision, no earache, sore throat or sinus congestion. NECK: No pain or stiffness. CARDIOVASCULAR: No chest pain or pressure. No palpitations. PULMONARY:\ Previous productive cough was treated for bronchitis and is asymptomatic. GASTROINTESTINAL: No abdominal pain, nausea, vomiting or diarrhea, melena or bright red blood per rectum. GENITOURINARY: No urinary frequency, urgency, hesitancy or dysuria. MUSCULOSKELETAL: No joint or muscle pain, no back pain, no recent trauma. DERMATOLOGIC: No rash, no itching, no lesions. ENDOCRINE: No polyuria, polydipsia, no heat or cold intolerance. No recent change in weight. HEMATOLOGICAL: No anemia or easy bruising or bleeding. NEUROLOGIC: No headache, seizures, numbness, tingling or weakness. PSYCHIATRIC: No depression, no loss of  interest in normal activity or change in sleep pattern.   Last menstrual period approximately 3 weeks ago   Physical Exam:  HEENT:unremarkable  Neck:Supple, midline, no thyroid megaly, no carotid bruits  Lungs: Clear to auscultation no rales or wheezes Heart: No murmurs or gallops  Breast Exam: At time of recent exam her breasts were normal  Abdomen: Soft nontender no rebound or guarding  Pelvic:BUS within normal limits  Vagina: No lesions or discharge  Cervix: No lesions or discharge  Uterus: Anteverted normal size shape and consistency nontender  Adnexa: No palpable mass or tenderness  Extremities: No cords, no edema  Rectal: Deferred   Assessment/Plan:  Was found on sonohysterogram to have a large endometrial polyp which has been a contributing factor to her menorrhagia and cramping. The patient's husband has had a vasectomy. Patient would like to have an endometrial ablation at the same time. Patient is scheduled for resectoscopic polypectomy and endometrial ablation the same setting. We will do a vigorous D&C to submit for histological evaluation before the ablation. Patient will be started on Prometrium 200 mg one by mouth daily 12 days before the procedure to thin out the endometrium.   The following risks were discussed with the patient in reference to her surgery:  Patient was counseled as to the risk of surgery to include the following:  1. Infection (prohylactic antibiotics will be administered)  2. DVT/Pulmonary Embolism (prophylactic pneumo compression stockings will be used)  3.Trauma to internal organs requiring additional surgical procedure to repair any injury to  Internal organs requiring perhaps additional hospitalization days.  4.Hemmorhage requiring transfusion and blood products which carry risks such as anaphylactic reaction, hepatitis and AIDS  Patient had received literature information on the procedure scheduled and all her questions were answered and fully  accepts all risk.   Patient was prescribed Toradol 10 mg to take one by mouth every 6 hours when necessary postop. Also prescription for Reglan 10 mg 1 by mouth every 6 hours when necessary nausea or vomiting. She was also prescribed Motrin 200 mg to take one pill daily for 12 days before her surgery.

## 2013-07-13 NOTE — Patient Instructions (Signed)

## 2013-07-19 ENCOUNTER — Telehealth: Payer: Self-pay | Admitting: *Deleted

## 2013-07-19 NOTE — Telephone Encounter (Signed)
Pt scheduled for D&C on 07/27/13 Rx were sent for Toradol 10 mg ,Reglan 10 mg,Motrin 200 mg. Pt said you told her antibiotic was going to be sent as well? Please advise

## 2013-07-19 NOTE — Telephone Encounter (Signed)
She will not need antibiotic before surgery. She will be given IV antibiotic before the start of her case. Please make sure that [facility] the HTA ablation equipment to be available as well as the representative

## 2013-07-19 NOTE — Telephone Encounter (Signed)
Left the below on pt voicemail per her request.

## 2013-07-19 NOTE — Telephone Encounter (Signed)
Olegario MessierKathy has taken care of this.

## 2013-07-20 ENCOUNTER — Ambulatory Visit: Payer: BC Managed Care – PPO | Admitting: Gynecology

## 2013-07-23 ENCOUNTER — Encounter (HOSPITAL_COMMUNITY): Payer: Self-pay | Admitting: *Deleted

## 2013-07-24 ENCOUNTER — Encounter (HOSPITAL_COMMUNITY): Payer: Self-pay | Admitting: Pharmacist

## 2013-07-26 MED ORDER — CEFOTETAN DISODIUM 2 G IJ SOLR
2.0000 g | INTRAMUSCULAR | Status: AC
  Administered 2013-07-27: 2 g via INTRAVENOUS
  Filled 2013-07-26: qty 2

## 2013-07-27 ENCOUNTER — Encounter (HOSPITAL_COMMUNITY): Payer: Self-pay

## 2013-07-27 ENCOUNTER — Encounter (HOSPITAL_COMMUNITY)
Admission: RE | Disposition: A | Discharge status: Home / Self Care | Payer: Self-pay | Source: Ambulatory Visit | Attending: Gynecology

## 2013-07-27 ENCOUNTER — Ambulatory Visit (HOSPITAL_COMMUNITY)
Admission: RE | Admit: 2013-07-27 | Discharge: 2013-07-27 | Disposition: A | Discharge status: Home / Self Care | Payer: BC Managed Care – PPO | Source: Ambulatory Visit | Attending: Gynecology | Admitting: Gynecology

## 2013-07-27 ENCOUNTER — Encounter (HOSPITAL_COMMUNITY): Payer: BC Managed Care – PPO | Admitting: Anesthesiology

## 2013-07-27 ENCOUNTER — Ambulatory Visit (HOSPITAL_COMMUNITY): Payer: BC Managed Care – PPO | Admitting: Anesthesiology

## 2013-07-27 DIAGNOSIS — Z9889 Other specified postprocedural states: Secondary | ICD-10-CM

## 2013-07-27 DIAGNOSIS — N949 Unspecified condition associated with female genital organs and menstrual cycle: Secondary | ICD-10-CM

## 2013-07-27 DIAGNOSIS — N920 Excessive and frequent menstruation with regular cycle: Secondary | ICD-10-CM | POA: Insufficient documentation

## 2013-07-27 DIAGNOSIS — N840 Polyp of corpus uteri: Secondary | ICD-10-CM

## 2013-07-27 DIAGNOSIS — N938 Other specified abnormal uterine and vaginal bleeding: Secondary | ICD-10-CM | POA: Insufficient documentation

## 2013-07-27 DIAGNOSIS — M26609 Unspecified temporomandibular joint disorder, unspecified side: Secondary | ICD-10-CM | POA: Insufficient documentation

## 2013-07-27 DIAGNOSIS — N925 Other specified irregular menstruation: Secondary | ICD-10-CM

## 2013-07-27 DIAGNOSIS — I4891 Unspecified atrial fibrillation: Secondary | ICD-10-CM | POA: Insufficient documentation

## 2013-07-27 DIAGNOSIS — Z9089 Acquired absence of other organs: Secondary | ICD-10-CM | POA: Insufficient documentation

## 2013-07-27 DIAGNOSIS — Z803 Family history of malignant neoplasm of breast: Secondary | ICD-10-CM | POA: Insufficient documentation

## 2013-07-27 HISTORY — PX: HYSTEROSCOPY: SHX211

## 2013-07-27 LAB — CBC
HCT: 39.4 % (ref 36.0–46.0)
Hemoglobin: 13.4 g/dL (ref 12.0–15.0)
MCH: 31 pg (ref 26.0–34.0)
MCHC: 34 g/dL (ref 30.0–36.0)
MCV: 91.2 fL (ref 78.0–100.0)
Platelets: 253 10*3/uL (ref 150–400)
RBC: 4.32 MIL/uL (ref 3.87–5.11)
RDW: 12.6 % (ref 11.5–15.5)
WBC: 6.8 10*3/uL (ref 4.0–10.5)

## 2013-07-27 LAB — PREGNANCY, URINE: Preg Test, Ur: NEGATIVE

## 2013-07-27 LAB — URINALYSIS, ROUTINE W REFLEX MICROSCOPIC
Bilirubin Urine: NEGATIVE
Glucose, UA: NEGATIVE mg/dL
Hgb urine dipstick: NEGATIVE
Ketones, ur: NEGATIVE mg/dL
Leukocytes, UA: NEGATIVE
Nitrite: NEGATIVE
Protein, ur: NEGATIVE mg/dL
Specific Gravity, Urine: 1.005 (ref 1.005–1.030)
Urobilinogen, UA: 0.2 mg/dL (ref 0.0–1.0)
pH: 6.5 (ref 5.0–8.0)

## 2013-07-27 SURGERY — DILATATION & CURETTAGE/HYSTEROSCOPY WITH MYOSURE
Anesthesia: General | Site: Vagina

## 2013-07-27 MED ORDER — LACTATED RINGERS IV SOLN
INTRAVENOUS | Status: DC
Start: 2013-07-27 — End: 2013-07-27
  Administered 2013-07-27 (×3): via INTRAVENOUS

## 2013-07-27 MED ORDER — DEXAMETHASONE SODIUM PHOSPHATE 4 MG/ML IJ SOLN
INTRAMUSCULAR | Status: DC | PRN
  Administered 2013-07-27: 10 mg via INTRAVENOUS

## 2013-07-27 MED ORDER — SODIUM CHLORIDE 0.9 % IJ SOLN
INTRAMUSCULAR | Status: AC
  Filled 2013-07-27: qty 50

## 2013-07-27 MED ORDER — KETOROLAC TROMETHAMINE 30 MG/ML IJ SOLN
INTRAMUSCULAR | Status: AC
  Administered 2013-07-27: 30 mg via INTRAVENOUS
  Filled 2013-07-27: qty 1

## 2013-07-27 MED ORDER — ACETAMINOPHEN 325 MG PO TABS
ORAL_TABLET | ORAL | Status: AC
  Filled 2013-07-27: qty 3

## 2013-07-27 MED ORDER — ONDANSETRON HCL 4 MG/2ML IJ SOLN
INTRAMUSCULAR | Status: DC | PRN
  Administered 2013-07-27: 4 mg via INTRAVENOUS

## 2013-07-27 MED ORDER — MIDAZOLAM HCL 2 MG/2ML IJ SOLN: 0.5000 mg | Freq: Once | INTRAMUSCULAR | Status: DC | PRN

## 2013-07-27 MED ORDER — KETOROLAC TROMETHAMINE 30 MG/ML IJ SOLN: 15.0000 mg | Freq: Once | INTRAMUSCULAR | Status: DC | PRN

## 2013-07-27 MED ORDER — VASOPRESSIN 20 UNIT/ML IJ SOLN
INTRAMUSCULAR | Status: AC
  Filled 2013-07-27: qty 1

## 2013-07-27 MED ORDER — MEPERIDINE HCL 25 MG/ML IJ SOLN: 6.2500 mg | INTRAMUSCULAR | Status: DC | PRN

## 2013-07-27 MED ORDER — FENTANYL CITRATE 0.05 MG/ML IJ SOLN
INTRAMUSCULAR | Status: AC
  Administered 2013-07-27: 50 ug via INTRAVENOUS
  Filled 2013-07-27: qty 2

## 2013-07-27 MED ORDER — VASOPRESSIN 20 UNIT/ML IJ SOLN
INTRAMUSCULAR | Status: DC | PRN
  Administered 2013-07-27: 10 [IU]

## 2013-07-27 MED ORDER — DIPHENHYDRAMINE HCL 50 MG/ML IJ SOLN
INTRAMUSCULAR | Status: AC
  Administered 2013-07-27: 12.5 mg via INTRAVENOUS
  Filled 2013-07-27: qty 1

## 2013-07-27 MED ORDER — PROPOFOL 10 MG/ML IV EMUL
INTRAVENOUS | Status: AC
  Filled 2013-07-27: qty 20

## 2013-07-27 MED ORDER — KETOROLAC TROMETHAMINE 30 MG/ML IJ SOLN
INTRAMUSCULAR | Status: AC
  Filled 2013-07-27: qty 1

## 2013-07-27 MED ORDER — LIDOCAINE HCL (CARDIAC) 20 MG/ML IV SOLN
INTRAVENOUS | Status: AC
  Filled 2013-07-27: qty 5

## 2013-07-27 MED ORDER — SODIUM CHLORIDE 0.9 % IR SOLN
Status: DC | PRN
  Administered 2013-07-27 (×2): 3000 mL

## 2013-07-27 MED ORDER — FENTANYL CITRATE 0.05 MG/ML IJ SOLN
INTRAMUSCULAR | Status: DC | PRN
  Administered 2013-07-27: 100 ug via INTRAVENOUS
  Administered 2013-07-27: 25 ug via INTRAVENOUS
  Administered 2013-07-27 (×2): 50 ug via INTRAVENOUS
  Administered 2013-07-27: 25 ug via INTRAVENOUS

## 2013-07-27 MED ORDER — LIDOCAINE HCL (CARDIAC) 20 MG/ML IV SOLN
INTRAVENOUS | Status: DC | PRN
  Administered 2013-07-27: 60 mg via INTRAVENOUS

## 2013-07-27 MED ORDER — ACETAMINOPHEN 325 MG PO TABS
ORAL_TABLET | ORAL | Status: AC
  Filled 2013-07-27: qty 1

## 2013-07-27 MED ORDER — SUCCINYLCHOLINE CHLORIDE 20 MG/ML IJ SOLN
INTRAMUSCULAR | Status: AC
  Filled 2013-07-27: qty 10

## 2013-07-27 MED ORDER — PROMETHAZINE HCL 25 MG/ML IJ SOLN: 6.2500 mg | INTRAMUSCULAR | Status: DC | PRN

## 2013-07-27 MED ORDER — KETOROLAC TROMETHAMINE 30 MG/ML IJ SOLN
30.0000 mg | Freq: Once | INTRAMUSCULAR | Status: AC
  Administered 2013-07-27: 30 mg via INTRAVENOUS

## 2013-07-27 MED ORDER — PROPOFOL INFUSION 10 MG/ML OPTIME
INTRAVENOUS | Status: DC | PRN
  Administered 2013-07-27: 160 mL via INTRAVENOUS

## 2013-07-27 MED ORDER — ONDANSETRON HCL 4 MG/2ML IJ SOLN
INTRAMUSCULAR | Status: AC
  Filled 2013-07-27: qty 2

## 2013-07-27 MED ORDER — DIPHENHYDRAMINE HCL 50 MG/ML IJ SOLN
12.5000 mg | Freq: Once | INTRAMUSCULAR | Status: AC
  Administered 2013-07-27: 12.5 mg via INTRAVENOUS

## 2013-07-27 MED ORDER — OXYTOCIN 10 UNIT/ML IJ SOLN
INTRAMUSCULAR | Status: AC
  Filled 2013-07-27: qty 4

## 2013-07-27 MED ORDER — METOPROLOL TARTRATE 1 MG/ML IV SOLN
5.0000 mg | INTRAVENOUS | Status: DC | PRN
  Filled 2013-07-27: qty 5

## 2013-07-27 MED ORDER — MIDAZOLAM HCL 2 MG/2ML IJ SOLN
INTRAMUSCULAR | Status: AC
  Filled 2013-07-27: qty 2

## 2013-07-27 MED ORDER — FENTANYL CITRATE 0.05 MG/ML IJ SOLN
INTRAMUSCULAR | Status: AC
  Filled 2013-07-27: qty 5

## 2013-07-27 MED ORDER — MIDAZOLAM HCL 2 MG/2ML IJ SOLN
INTRAMUSCULAR | Status: DC | PRN
  Administered 2013-07-27: 2 mg via INTRAVENOUS

## 2013-07-27 MED ORDER — ACETAMINOPHEN 325 MG PO TABS
975.0000 mg | ORAL_TABLET | Freq: Four times a day (QID) | ORAL | Status: AC | PRN
  Administered 2013-07-27: 975 mg via ORAL

## 2013-07-27 MED ORDER — FENTANYL CITRATE 0.05 MG/ML IJ SOLN
25.0000 ug | INTRAMUSCULAR | Status: DC | PRN
  Administered 2013-07-27 (×2): 50 ug via INTRAVENOUS

## 2013-07-27 MED ORDER — KETOROLAC TROMETHAMINE 30 MG/ML IJ SOLN
INTRAMUSCULAR | Status: DC | PRN
  Administered 2013-07-27: 30 mg via INTRAVENOUS

## 2013-07-27 SURGICAL SUPPLY — 33 items
CANISTERS HI-FLOW 3000CC (CANNISTER) ×3 IMPLANT
CATH FOLEY 2WAY SLVR 30CC 16FR (CATHETERS) ×2 IMPLANT
CATH ROBINSON RED A/P 16FR (CATHETERS) ×2 IMPLANT
CLOTH BEACON ORANGE TIMEOUT ST (SAFETY) ×2 IMPLANT
CONTAINER PREFILL 10% NBF 60ML (FORM) ×3 IMPLANT
DEVICE MYOSURE CLASSIC (MISCELLANEOUS) ×1 IMPLANT
DEVICE MYOSURE LITE (MISCELLANEOUS) IMPLANT
DRAPE HYSTEROSCOPY (DRAPE) ×2 IMPLANT
ELECTRODE ROLLER VERSAPOINT (ELECTRODE) IMPLANT
ELECTRODE RT ANGLE VERSAPOINT (CUTTING LOOP) IMPLANT
FILTER ARTHROSCOPY CONVERTOR (FILTER) ×2 IMPLANT
GLOVE BIOGEL PI IND STRL 8 (GLOVE) ×1 IMPLANT
GLOVE BIOGEL PI INDICATOR 8 (GLOVE) ×1
GLOVE ECLIPSE 7.5 STRL STRAW (GLOVE) ×4 IMPLANT
GOWN STRL REUS W/TWL LRG LVL3 (GOWN DISPOSABLE) ×4 IMPLANT
KIT BERKELEY 1ST TRIMESTER 3/8 (MISCELLANEOUS) ×1 IMPLANT
LOOP ANGLED CUTTING 22FR (CUTTING LOOP) IMPLANT
PACK VAGINAL MINOR WOMEN LF (CUSTOM PROCEDURE TRAY) ×2 IMPLANT
PAD OB MATERNITY 4.3X12.25 (PERSONAL CARE ITEMS) ×3 IMPLANT
PAD PREP 24X48 CUFFED NSTRL (MISCELLANEOUS) ×2 IMPLANT
PLUG CATH AND CAP STER (CATHETERS) ×1 IMPLANT
SEAL ROD LENS SCOPE MYOSURE (ABLATOR) ×2 IMPLANT
SET BERKELEY SUCTION TUBING (SUCTIONS) ×1 IMPLANT
SET GENESYS HTA PROCERVA (MISCELLANEOUS) ×1 IMPLANT
SET TUBING HYSTEROSCOPY 2 NDL (TUBING) ×2 IMPLANT
SYR 30ML LL (SYRINGE) IMPLANT
TOWEL OR 17X24 6PK STRL BLUE (TOWEL DISPOSABLE) ×4 IMPLANT
TUBE HYSTEROSCOPY W Y-CONNECT (TUBING) ×1 IMPLANT
VACURETTE 7MM STRAIGHT (CANNULA) IMPLANT
VACURETTE 8 RIGID CVD (CANNULA) IMPLANT
VACURETTE 9 RIGID CVD (CANNULA) IMPLANT
VACURRETTE 7MM STRAIGHT (CANNULA) ×2
WATER STERILE IRR 1000ML POUR (IV SOLUTION) ×2 IMPLANT

## 2013-07-27 NOTE — H&P (View-Only) (Signed)
Jenna Cortez is an 48 y.o. female. Had presented to the office today for further evaluation of her menorrhagia and for sonohysterogram.The patient describes her cycles occurring every 24-40 days and the first 3-4 days are very heavy with passage of large clots and cramping. Occasionally she has had some postcoital bleeding. Patient had previously been provided with information on endometrial ablation depending on findings from the sonohysterogram.   The results of the sonohysterogram today demonstrated the following:  Uterus measuring 9.3 x 6.2 x 4.8 cm with endometrial stripe of 9.6 mm. Ovaries appeared to be normal a left adnexal solid avascular area separate from the left ovary were seen measuring 21 x 17 x 11 mm questionable fibroid. No free fluid in the cul-de-sac seen.   After the cervix was cleansed with Betadine solution a sterile Pipelle was introduced into the uterine cavity and normal saline was instilled. A posterior uterine polyp measuring 25 x 7 mm was noted.  The patient and scheduled for resectoscopic polypectomy endometrial ablation in an outpatient setting later this month.   Patient's recent Pap smear was normal.   Pertinent Gynecological History:  Menses: Heavy menstrual cycle with passage of large clots  Bleeding: Same as above  Contraception: vasectomy  DES exposure: denies  Blood transfusions: none  Sexually transmitted diseases: HSV  Previous GYN Procedures: 2 normal spontaneous vaginal deliveries  Last mammogram: normal Date: 2013  Last pap: normal Date: 2015  OB History: G 2, P 2   Menstrual History:  Menarche age: 54  Patient's last menstrual period was 05/01/2013.  Past Medical History   Diagnosis  Date   .  TMJ (dislocation of temporomandibular joint)    .  Insomnia    .  Depression    .  Herpes      HERPES SIMPLEX VIRUS   .  Dysplasia of cervix, unspecified      HISTORY OF CERVICAL DYSPLASIA   .  Hyperprolactinemia    .  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  BRCA2 negative      NEGATIVE MUTATION 01/05/06   .  History of cholecystectomy    .  Atrial fibrillation    .  Anxiety     Past Surgical History   Procedure  Laterality  Date   .  Shoulder surgery       RIGHT ROTATOR CUFF 2000   .  Chest surgery       RIGHT CLAVICLE FRACTURE-TRAUMATIC   .  Cryotherapy       CERVIX   .  Breast surgery       RIGHT BREAST BIOPSY 2001-PAPILOMA/LT BR BXY-BENIGN 2003   .  Augmentation mammaplasty       AUGMENTATION 1998   .  Laparoscopic cholecystectomy   2011    Family History   Problem  Relation  Age of Onset   .  Breast cancer  Mother    .  Hypertension  Father    .  Atrial fibrillation  Brother    Social History: reports that she has never smoked. She has never used smokeless tobacco. She reports that she drinks alcohol. She reports that she does not use illicit drugs.  Allergies:  Allergies   Allergen  Reactions   .  Codeine  Itching   .  Sulfonamide Derivatives  Rash   (Not in a hospital admission)  REVIEW OF SYSTEMS: A ROS was performed and pertinent positives and negatives are included in the  history.  GENERAL: No fevers or chills. HEENT: No change in vision, no earache, sore throat or sinus congestion. NECK: No pain or stiffness. CARDIOVASCULAR: No chest pain or pressure. No palpitations. PULMONARY:\ Previous productive cough was treated for bronchitis and is asymptomatic. GASTROINTESTINAL: No abdominal pain, nausea, vomiting or diarrhea, melena or bright red blood per rectum. GENITOURINARY: No urinary frequency, urgency, hesitancy or dysuria. MUSCULOSKELETAL: No joint or muscle pain, no back pain, no recent trauma. DERMATOLOGIC: No rash, no itching, no lesions. ENDOCRINE: No polyuria, polydipsia, no heat or cold intolerance. No recent change in weight. HEMATOLOGICAL: No anemia or easy bruising or bleeding. NEUROLOGIC: No headache, seizures, numbness, tingling or weakness. PSYCHIATRIC: No depression, no loss of  interest in normal activity or change in sleep pattern.   Last menstrual period approximately 3 weeks ago   Physical Exam:  HEENT:unremarkable  Neck:Supple, midline, no thyroid megaly, no carotid bruits  Lungs: Clear to auscultation no rales or wheezes Heart: No murmurs or gallops  Breast Exam: At time of recent exam her breasts were normal  Abdomen: Soft nontender no rebound or guarding  Pelvic:BUS within normal limits  Vagina: No lesions or discharge  Cervix: No lesions or discharge  Uterus: Anteverted normal size shape and consistency nontender  Adnexa: No palpable mass or tenderness  Extremities: No cords, no edema  Rectal: Deferred   Assessment/Plan:  Was found on sonohysterogram to have a large endometrial polyp which has been a contributing factor to her menorrhagia and cramping. The patient's husband has had a vasectomy. Patient would like to have an endometrial ablation at the same time. Patient is scheduled for resectoscopic polypectomy and endometrial ablation the same setting. We will do a vigorous D&C to submit for histological evaluation before the ablation. Patient will be started on Prometrium 200 mg one by mouth daily 12 days before the procedure to thin out the endometrium.   The following risks were discussed with the patient in reference to her surgery:  Patient was counseled as to the risk of surgery to include the following:  1. Infection (prohylactic antibiotics will be administered)  2. DVT/Pulmonary Embolism (prophylactic pneumo compression stockings will be used)  3.Trauma to internal organs requiring additional surgical procedure to repair any injury to  Internal organs requiring perhaps additional hospitalization days.  4.Hemmorhage requiring transfusion and blood products which carry risks such as anaphylactic reaction, hepatitis and AIDS  Patient had received literature information on the procedure scheduled and all her questions were answered and fully  accepts all risk.   Patient was prescribed Toradol 10 mg to take one by mouth every 6 hours when necessary postop. Also prescription for Reglan 10 mg 1 by mouth every 6 hours when necessary nausea or vomiting. She was also prescribed Motrin 200 mg to take one pill daily for 12 days before her surgery.

## 2013-07-27 NOTE — Discharge Instructions (Signed)
DISCHARGE INSTRUCTIONS: D&C  The following instructions have been prepared to help you care for yourself upon your return home.  No Ibuprofen containing products (ie Advil, Aleve, Motrin, etc.) until after 8:45 pm tonight  Personal hygiene:  Use sanitary pads for vaginal drainage, not tampons.  Shower the day after your procedure.  NO tub baths, pools or Jacuzzis for 2-3 weeks.  Wipe front to back after using the bathroom.  Activity and limitations:  Do NOT drive or operate any equipment for 24 hours. The effects of anesthesia are still present and drowsiness may result.  Do NOT rest in bed all day.  Walking is encouraged.  Walk up and down stairs slowly.  You may resume your normal activity in one to two days or as indicated by your physician.  Sexual activity: NO intercourse for at least 2 weeks after the procedure, or as indicated by your physician.  Diet: Eat a light meal as desired this evening. You may resume your usual diet tomorrow.  Return to work: You may resume your work activities in one to two days or as indicated by your doctor.  What to expect after your surgery: Expect to have vaginal bleeding/discharge for 2-3 days and spotting for up to 10 days. It is not unusual to have soreness for up to 1-2 weeks. You may have a slight burning sensation when you urinate for the first day. Mild cramps may continue for a couple of days. You may have a regular period in 2-6 weeks.  Call your doctor for any of the following:  Excessive vaginal bleeding, saturating and changing one pad every hour.  Inability to urinate 6 hours after discharge from hospital.  Pain not relieved by pain medication.  Fever of 100.4 F or greater.  Unusual vaginal discharge or odor.   Call for an appointment:    Patients signature: ______________________  Nurses signature ________________________  Support person's signature_______________________

## 2013-07-27 NOTE — Transfer of Care (Signed)
Immediate Anesthesia Transfer of Care Note  Patient: Lattie CornsMelissa T Hamblen  Procedure(s) Performed: Procedure(s): DILATATION & CURETTAGE/HYSTEROSCOPY WITH MYOSURE ABLATION (N/A) HYSTEROSCOPY WITH HYDROTHERMAL ABLATION (N/A)  Patient Location: PACU  Anesthesia Type:General  Level of Consciousness: awake, alert  and oriented  Airway & Oxygen Therapy: Patient Spontanous Breathing  Post-op Assessment: Report given to PACU RN and Post -op Vital signs reviewed and stable  Post vital signs: Reviewed and stable  Complications: No apparent anesthesia complications

## 2013-07-27 NOTE — Anesthesia Preprocedure Evaluation (Addendum)
Anesthesia Evaluation  Patient identified by MRN, date of birth, ID band Patient awake    Reviewed: Allergy & Precautions, H&P , Patient's Chart, lab work & pertinent test results, reviewed documented beta blocker date and time   History of Anesthesia Complications Negative for: history of anesthetic complications  Airway Mallampati: II TM Distance: >3 FB Neck ROM: full    Dental   Pulmonary  breath sounds clear to auscultation        Cardiovascular Exercise Tolerance: Good + dysrhythmias Atrial Fibrillation Rhythm:regular Rate:Normal     Neuro/Psych  Headaches, negative psych ROS   GI/Hepatic   Endo/Other    Renal/GU      Musculoskeletal   Abdominal   Peds  Hematology   Anesthesia Other Findings TMJ with prior mandibular dislocation  Reproductive/Obstetrics                           Anesthesia Physical Anesthesia Plan  ASA: II  Anesthesia Plan: General LMA   Post-op Pain Management:    Induction:   Airway Management Planned:   Additional Equipment:   Intra-op Plan:   Post-operative Plan:   Informed Consent: I have reviewed the patients History and Physical, chart, labs and discussed the procedure including the risks, benefits and alternatives for the proposed anesthesia with the patient or authorized representative who has indicated his/her understanding and acceptance.   Dental Advisory Given  Plan Discussed with: CRNA, Surgeon and Anesthesiologist  Anesthesia Plan Comments:        afib in past.  No recent episodes so no meds needed to break recently. Will have IV metoprolol OCTOR Anesthesia Quick Evaluation

## 2013-07-27 NOTE — Op Note (Signed)
Operative Note  07/27/2013  5:20 PM  PATIENT:  Jenna Cortez  48 y.o. female  PRE-OPERATIVE DIAGNOSIS:  Endometrial polyp, dysfunction uterine bleeding  POST-OPERATIVE DIAGNOSIS:  Endometrial polyp, dysfunctional uterine bleeding   PROCEDURE:  Procedure(s): 1. DILATATION & CURETTAGE/HYSTEROSCOPY WITH MYOSURE 2. ENDOMETRIAL  ABLATION HYSTEROSCOPY WITH HYDROTHERMAL ABLATION  SURGEON:  Surgeon(s): Ok EdwardsJuan H Fernandez, MD  ANESTHESIA:   general  FINDINGS: Multiple endometrial polyps  DESCRIPTION OF OPERATION: The patient was taken to the operating room where she underwent a successful general endotracheal anesthesia. A time out was undertaken to properly identify the patient and discussed the case to be undertaken. Patient did receive 2 units of Cefotan preop. For DVT prophylaxis she had PAS stockings. The ligature placed in the high lithotomy position. The vagina and perineum were prepped and draped in the usual sterile fashion. Bimanual examination demonstrated the uterus to be upper limits of normal retroverted no palpable adnexal masses. A single-tooth tenaculum was placed on the anterior cervical lip. The uterus sounded to 7-1/2 cm. A red rubber Roxan HockeyRobinson was inserted to empty the bladder is content for approximately 50 cc.Pratt dilator were used to dilate the cervical canal to a 15 mm size. The Hologic Myosure resectoscopic morcellator with a scope of 6.25 mm and an operating blade of 3.0 mm was introduced into the intrauterine cavity. 0.9% normal saline was the distending media. Inspection of the endometrial cavity demonstrated multiple small uterine polyps located in the lower uterine segment. With the resectoscopic morcellator they were removed and submitted for histological evaluation. A suction curettage was also utilized as well as a blood curettings to clear the entire cavity and submitted for histological evaluation before the endometrial ablation. Fluid deficit was approximately  250 cc.  The second portion of the operation was the endometrial ablation via the hydrothermal ablation technique (HTA). An 8 mm hysteroscope was utilized along with the HTA instrument. Normal saline was the distending media. A thorough inspection of the endocervical canal and endometrium was accomplished. The device was set at the lower uterine segment a complete seal was noted. Prior to that the cervix had been dilated with a Pratt dilator to a size 21. The normal saline was heated to 90 and kept there for 10 minutes to ablate the endometrium. A cooling period of 1-1/2 minutes was accomplished before the instrument was removed. The single-tooth tenaculum was removed and a 30 cc Foley catheter was placed into the uterine cavity for additional tamponade of the uterine cavity postop to be removed in the recovery room 30 minutes before discharge. Patient was extubated and transferred to recovery room stable vital signs. She received Toradol 30 mg IV in route to the recovery room.  The single-tooth tenaculum was removed patient was extubated transferred to recovery room stable vital signs blood loss was minimal. She received 30 mg of Toradol in route to the recovery room.    ESTIMATED BLOOD LOSS: Minimal  Intake/Output Summary (Last 24 hours) at 07/27/13 1720 Last data filed at 07/27/13 1600  Gross per 24 hour  Intake   1400 ml  Output      0 ml  Net   1400 ml     BLOOD ADMINISTERED:none   LOCAL MEDICATIONS USED:  OTHER Pitressin 1 units of Pitressin per 20 cc of saline total amount injected cervical stroma 10 cc  SPECIMEN:  Source of Specimen:  Endometrial polyps and curettings  DISPOSITION OF SPECIMEN:  PATHOLOGY  COUNTS:  YES  PLAN OF CARE: Transfer to  PACU  [facility] State HospitalFERNANDEZ,JUAN HMD5:20 PMTD@  Note: This dictation was prepared with  Dragon/digital dictation along withSmart phrase technology. Any transcriptional errors that result from this process are unintentional.

## 2013-07-27 NOTE — Interval H&P Note (Signed)
History and Physical Interval Note:  07/27/2013 12:47 PM  Jenna Cortez  has presented today for surgery, with the diagnosis of Endometrial polyp  The various methods of treatment have been discussed with the patient and family. After consideration of risks, benefits and other options for treatment, the patient has consented to  Procedure(s) with comments: DILATATION & CURETTAGE/HYSTEROSCOPY WITH MYOSURE ABLATION/HYDROTHERMAL ABLATION (N/A) - WITH MYOSURE. I HAVE ARRANGED REP TO BE THERE. HYSTEROSCOPY WITH HYDROTHERMAL ABLATION/POSSIBLE VERSAPOINT (N/A) as a surgical intervention .  The patient's history has been reviewed, patient examined, no change in status, stable for surgery.  I have reviewed the patient's chart and labs.  Questions were answered to the patient's satisfaction.     Ok EdwardsFERNANDEZ,Amiel Sharrow H

## 2013-07-27 NOTE — Anesthesia Procedure Notes (Signed)
Procedure Name: LMA Insertion Date/Time: 07/27/2013 1:06 PM Performed by: Graciela HusbandsFUSSELL, Danyeal Akens O Pre-anesthesia Checklist: Patient being monitored, Suction available, Emergency Drugs available, Patient identified and Timeout performed Patient Re-evaluated:Patient Re-evaluated prior to inductionOxygen Delivery Method: Circle system utilized Preoxygenation: Pre-oxygenation with 100% oxygen Intubation Type: IV induction LMA: LMA inserted LMA Size: 4.0 Number of attempts: 1 Placement Confirmation: breath sounds checked- equal and bilateral and positive ETCO2 Tube secured with: Tape Dental Injury: Teeth and Oropharynx as per pre-operative assessment

## 2013-07-28 NOTE — Anesthesia Postprocedure Evaluation (Signed)
  Anesthesia Post-op Note  Patient: Jenna Cortez  Procedure(s) Performed: Procedure(s): DILATATION & CURETTAGE/HYSTEROSCOPY WITH MYOSURE ABLATION (N/A) HYSTEROSCOPY WITH HYDROTHERMAL ABLATION (N/A) Patient is awake and responsive. Pain and nausea are reasonably well controlled. Vital signs are stable and clinically acceptable. Oxygen saturation is clinically acceptable. There are no apparent anesthetic complications at this time. Patient is ready for discharge.

## 2013-07-30 ENCOUNTER — Encounter (HOSPITAL_COMMUNITY): Payer: Self-pay | Admitting: Gynecology

## 2013-08-10 ENCOUNTER — Ambulatory Visit: Payer: BC Managed Care – PPO | Admitting: Gynecology

## 2013-08-13 ENCOUNTER — Ambulatory Visit (INDEPENDENT_AMBULATORY_CARE_PROVIDER_SITE_OTHER): Payer: BC Managed Care – PPO | Admitting: Gynecology

## 2013-08-13 ENCOUNTER — Encounter: Payer: Self-pay | Admitting: Gynecology

## 2013-08-13 VITALS — BP 130/72

## 2013-08-13 DIAGNOSIS — Z09 Encounter for follow-up examination after completed treatment for conditions other than malignant neoplasm: Secondary | ICD-10-CM

## 2013-08-13 NOTE — Progress Notes (Signed)
   Patient presented to the office today for her two-week postop visit. Patient status post resectoscopic polypectomy and endometrial biopsy (curettage) with the Myosure hysteroscopic morcellator. Also patient had HTA endometrial ablation. Patient doing well had some spotting for the first few days after surgery and some cramping but otherwise doing well today. The procedure as well as the following pathology report and pictures were discussed with the patient:   Endometrial polyp - BENIGN ENDOMETRIAL POLYP AND ADJACENT BENIGN WEAKLY PROLIFERATIVE ENDOMETRIUM WITH PSEUDODECIDUALIZED STROMA, NO ATYPIA, HYPERPLASIA OR MALIGNANCY. - UNDERLYING MYOMETRIUM.  Exam: Abdomen: Soft nontender no rebound or guarding Pelvic: The urethra Skene was within normal limits Vagina: No lesions or discharge Cervix: No lesions or discharge Uterus: Anteverted normal size shape and consistency Adnexa: No palpable mass or tenderness Rectal exam not done  Assessment/plan: Patient 2 weeks status post resectoscopic polypectomy and endometrial ablation doing well. Patient to resume full normal activity. We will see her back in one year or when necessary.

## 2013-08-19 ENCOUNTER — Telehealth: Payer: Self-pay | Admitting: Cardiology

## 2013-08-19 NOTE — Telephone Encounter (Signed)
Pt called saying her heart rhythm is irregular. This started last night. She took Toprol 12.5 mg followed by another 12.5 mg 30 minutes later. This am she still feels like her heart is "out of rhythm". She is [place] way [place] [facility] [place]. I suggested she take Toprol 25 mg now and if she is still in an irregular rhythm [place] [facility] [place] will come by the ER. She preferred to do this as opposed to [place]. When seen in the ER in March for this she converted with a "pocket dose" of Flecainide 200 mg. She has an appointment with Dr Tillie RungAlred 08/24/13.   Kosisochukwu Goldberg PA-C 08/19/2013 10:00 AM

## 2013-08-24 ENCOUNTER — Ambulatory Visit (INDEPENDENT_AMBULATORY_CARE_PROVIDER_SITE_OTHER): Payer: BC Managed Care – PPO | Admitting: Internal Medicine

## 2013-08-24 ENCOUNTER — Encounter: Payer: Self-pay | Admitting: Internal Medicine

## 2013-08-24 VITALS — BP 117/66 | HR 81 | Ht 63.0 in | Wt 143.0 lb

## 2013-08-24 DIAGNOSIS — R002 Palpitations: Secondary | ICD-10-CM

## 2013-08-24 DIAGNOSIS — I4891 Unspecified atrial fibrillation: Secondary | ICD-10-CM

## 2013-08-24 DIAGNOSIS — I471 Supraventricular tachycardia: Secondary | ICD-10-CM

## 2013-08-24 DIAGNOSIS — I498 Other specified cardiac arrhythmias: Secondary | ICD-10-CM

## 2013-08-24 DIAGNOSIS — I480 Paroxysmal atrial fibrillation: Secondary | ICD-10-CM

## 2013-08-24 MED ORDER — METOPROLOL SUCCINATE ER 25 MG PO TB24: ORAL_TABLET | ORAL | Status: DC

## 2013-08-24 NOTE — Patient Instructions (Signed)
Your physician recommends that you schedule a follow-up appointment in 3 months with Dr Allred    

## 2013-08-26 ENCOUNTER — Encounter: Payer: Self-pay | Admitting: Internal Medicine

## 2013-08-26 DIAGNOSIS — I471 Supraventricular tachycardia: Secondary | ICD-10-CM | POA: Insufficient documentation

## 2013-08-26 DIAGNOSIS — R002 Palpitations: Secondary | ICD-10-CM | POA: Insufficient documentation

## 2013-08-26 NOTE — Progress Notes (Signed)
PCP:  Annye Asa, MD Primary Cardiologist:  Previously Dr Verl Blalock  The patient presents today for cardiology followup. She was previously followed by Dr Verl Blalock.  Most recently, she was seen by Richardson Dopp.  She has both pacs and pvcs which cause palpitations as well as paroxysmal atrial fibrillation.  She is presently treated with metoprolol.    The patient reports doing very well.  She wore an event monitor 6/15 which documented frequent episodes of nonsustained atach/ afib as the cause for her palpitations. Today, she denies symptoms of chest pain, shortness of breath, orthopnea, PND, lower extremity edema, dizziness, presyncope, syncope, or neurologic sequela.  The patient feels that she is tolerating medications without difficulties and is otherwise without complaint today.   Past Medical History  Diagnosis Date  . TMJ (dislocation of temporomandibular joint)   . Insomnia   . Depression   . Herpes     HERPES SIMPLEX VIRUS  . Dysplasia of cervix, unspecified     HISTORY OF CERVICAL DYSPLASIA  . Hyperprolactinemia   . BRCA1 negative     NEGATIVE MUTATION 01/05/06  . BRCA2 negative     NEGATIVE MUTATION 01/05/06  . History of cholecystectomy   . Atrial fibrillation   . Anxiety   . Dysrhythmia     occ A-fib- has prn med to take if needed but hasn't needed it.   Past Surgical History  Procedure Laterality Date  . Shoulder surgery      RIGHT ROTATOR CUFF  2000  . Chest surgery      RIGHT CLAVICLE FRACTURE-TRAUMATIC  . Cryotherapy      CERVIX  . Breast surgery      RIGHT BREAST BIOPSY 2001-PAPILOMA/LT BR BXY-BENIGN 2003  . Augmentation mammaplasty      AUGMENTATION 1998  . Laparoscopic cholecystectomy  2011  . Hysteroscopy N/A 07/27/2013    Procedure: HYSTEROSCOPY WITH HYDROTHERMAL ABLATION;  Surgeon: Terrance Mass, MD;  Location: [facility];  Service: Gynecology;  Laterality: N/A;    Current Outpatient Prescriptions  Medication Sig Dispense Refill  . aspirin EC 81 MG  tablet Take 81 mg by mouth daily.      . metoprolol succinate (TOPROL-XL) 25 MG 24 hr tablet Take daily and as needed  45 tablet  11   No current facility-administered medications for this visit.    Allergies  Allergen Reactions  . Codeine Itching  . Sulfonamide Derivatives Rash    History   Social History  . Marital Status: Legally Separated    Spouse Name: N/A    Number of Children: N/A  . Years of Education: N/A   Occupational History  . Not on file.   Social History Main Topics  . Smoking status: Never Smoker   . Smokeless tobacco: Never Used  . Alcohol Use: Yes     Comment: OCCASIONALLY  . Drug Use: No  . Sexual Activity: Yes    Birth Control/ Protection: Condom   Other Topics Concern  . Not on file   Social History Narrative  . No narrative on file    Family History  Problem Relation Age of Onset  . Breast cancer Mother   . Hypertension Father   . Atrial fibrillation Brother     ROS-  All systems are reviewed and are negative except as outlined in the HPI above  Physical Exam: Filed Vitals:   08/24/13 0932  BP: 117/66  Pulse: 81  Height: $Remove'5\' 3"'hDAwiaE$  (1.6 m)  Weight: 143 lb (64.864 kg)  GEN- The patient is well appearing, alert and oriented x 3 today.   Head- normocephalic, atraumatic Eyes-  Sclera clear, conjunctiva pink Ears- hearing intact Oropharynx- clear Neck- supple, no JVP Lymph- no cervical lymphadenopathy Lungs- Clear to ausculation bilaterally, normal work of breathing Heart- Regular rate and rhythm, no murmurs, rubs or gallops, PMI not laterally displaced GI- soft, NT, ND, + BS Extremities- no clubbing, cyanosis, or edema MS- no significant deformity or atrophy Skin- no rash or lesion Psych- euthymic mood, full affect Neuro- strength and sensation are intact  ekg today reveals sinus rhythm 81 bpm, otherwise normal ekg Event monitor is reviewed Epic records are reviewed Echo 2013 is reviewed and normal  Assessment and  Plan:  1. Afib/ atrial tachycardia/ palpitations The patient has symptomatic atrial arrhythmias.  She has salvos of atrial tachycardia/ afib likely arising from a PV focus. She feels that her symptoms are reasonably controlled with metoprolol.  She does not wish to consider AAD therapy or ablation at this time.  Her chads2vasc score is only 1 .  We will therefore not anticoagulate at this time.  If her symptoms worsen, I think that flecainide would be a good option.  She will return in 3 months for follow-up.

## 2013-09-04 ENCOUNTER — Encounter: Payer: Self-pay | Admitting: Internal Medicine

## 2013-11-30 ENCOUNTER — Ambulatory Visit: Payer: BC Managed Care – PPO | Admitting: Internal Medicine

## 2013-12-03 ENCOUNTER — Encounter: Payer: Self-pay | Admitting: Internal Medicine

## 2013-12-05 ENCOUNTER — Encounter: Payer: Self-pay | Admitting: Internal Medicine

## 2014-02-01 HISTORY — PX: SHOULDER ARTHROSCOPY: SHX128

## 2014-03-11 ENCOUNTER — Ambulatory Visit: Payer: Self-pay | Admitting: Gynecology

## 2014-07-09 ENCOUNTER — Other Ambulatory Visit: Payer: Self-pay

## 2014-07-09 DIAGNOSIS — Z1231 Encounter for screening mammogram for malignant neoplasm of breast: Secondary | ICD-10-CM

## 2014-07-30 ENCOUNTER — Ambulatory Visit (INDEPENDENT_AMBULATORY_CARE_PROVIDER_SITE_OTHER): Payer: BC Managed Care – PPO | Admitting: Gynecology

## 2014-07-30 ENCOUNTER — Encounter: Payer: Self-pay | Admitting: Gynecology

## 2014-07-30 ENCOUNTER — Ambulatory Visit (HOSPITAL_COMMUNITY)
Admission: RE | Admit: 2014-07-30 | Discharge: 2014-07-30 | Disposition: A | Discharge status: Home / Self Care | Payer: BC Managed Care – PPO | Source: Ambulatory Visit | Attending: Gynecology | Admitting: Gynecology

## 2014-07-30 VITALS — BP 122/70 | Ht 64.0 in | Wt 141.0 lb

## 2014-07-30 DIAGNOSIS — Z01419 Encounter for gynecological examination (general) (routine) without abnormal findings: Secondary | ICD-10-CM | POA: Diagnosis not present

## 2014-07-30 DIAGNOSIS — R059 Cough, unspecified: Secondary | ICD-10-CM

## 2014-07-30 DIAGNOSIS — R05 Cough: Secondary | ICD-10-CM | POA: Diagnosis not present

## 2014-07-30 DIAGNOSIS — Z8639 Personal history of other endocrine, nutritional and metabolic disease: Secondary | ICD-10-CM

## 2014-07-30 NOTE — Progress Notes (Signed)
Jenna Cortez 1965-12-12 [id number]   History:    49 y.o.  for annual gyn exam who is only complaint is of nonproductive cough for 2 weeks after she completed treatment for strep throat 3 weeks prior. Patient denies any fever, chills, nausea or vomiting. Patient is in the healthcare environment. She had been provided with Flonase and Tessalon Perles with minimal resolution by an urgent care physician.  Review of patient's record indicated that she has a past history of colon polyps in 2010 which was noted on screening colonoscopy because of family history of colon cancer. 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  In 2015 patient had resectoscopic polypectomy as well as endometrial ablation via hydro-thermal ablation technique and has been amenorrheic. Patient's been followed by the cardiologist in the past several result of PACs and PVCs as well as paroxysmal atrial fibrillation. She has been treated with metoprolol.  Patient's mother had breast cancer and patient has tested negative for the BRCA one BRCA2 gene mutation and is having 3-D mammograms yearly as well because she has dense breasts and has saline implants.  Past medical history,surgical history, family history and social history were all reviewed and documented in the EPIC chart.  Gynecologic History Patient's last menstrual period was 08/28/2013. Contraception: vasectomy Last Pap: 2015 . Results were: normal Last mammogram: 2015 . Results were: Normal but dense   Obstetric History OB History  Gravida Para Term Preterm AB SAB TAB Ectopic Multiple Living  $Remov'2 2 2       2    'fRlqBk$ # Outcome Date GA Lbr Len/2nd Weight Sex Delivery Anes PTL Lv  2 Term     M Vag-Spont  N Y  1 Term     F Vag-Spont  N Y       ROS: A ROS was performed and pertinent positives and negatives are included in the history.  GENERAL: No fevers or chills. HEENT: No change  in vision, no earache, sore throat or sinus congestion. NECK: No pain or stiffness. CARDIOVASCULAR: No chest pain or pressure. No palpitations. PULMONARY: No shortness of breath, cough or wheeze. GASTROINTESTINAL: No abdominal pain, nausea, vomiting or diarrhea, melena or bright red blood per rectum. GENITOURINARY: No urinary frequency, urgency, hesitancy or dysuria. MUSCULOSKELETAL: No joint or muscle pain, no back pain, no recent trauma. DERMATOLOGIC: No rash, no itching, no lesions. ENDOCRINE: No polyuria, polydipsia, no heat or cold intolerance. No recent change in weight. HEMATOLOGICAL: No anemia or easy bruising or bleeding. NEUROLOGIC: No headache, seizures, numbness, tingling or weakness. PSYCHIATRIC: No depression, no loss of interest in normal activity or change in sleep pattern.     Exam: chaperone present  BP 122/70 mmHg  Ht $R'5\' 4"'ie$  (1.626 m)  Wt 141 lb (63.957 kg)  BMI 24.19 kg/m2  LMP 08/28/2013  Body mass index is 24.19 kg/(m^2).  General appearance : Well developed well nourished female. No acute distress HEENT: Eyes: no retinal hemorrhage or exudates,  Neck supple, trachea midline, no carotid bruits, no thyroidmegaly Lungs: Clear to auscultation, no rhonchi or wheezes, or rib retractions  Heart: Regular rate and rhythm, no murmurs or gallops Breast:Examined in sitting and supine position were symmetrical in appearance, no palpable masses or tenderness,  no skin retraction, no nipple inversion, no nipple discharge, no skin discoloration, no axillary or supraclavicular lymphadenopathy Abdomen: no palpable masses or tenderness, no rebound or guarding Extremities: no edema or  skin discoloration or tenderness  Pelvic:  Bartholin, Urethra, Skene Glands: Within normal limits             Vagina: No gross lesions or discharge  Cervix: No gross lesions or discharge  Uterus  anteverted , normal size, shape and consistency, non-tender and mobile  Adnexa  Without masses or  tenderness  Anus and perineum  normal   Rectovaginal  normal sphincter tone without palpated masses or tenderness             Hemoccult not indicated    Assessment/Plan:  49 y.o. female for annual exam with 2 weeks history of nonproductive cough. We will order a chest x-ray PA and lateral today as well as order a Quanteferon TB gold assay. Also the following screening labs were ordered: Fasting lipid profile, comprehensive metabolic panel, TSH, CBC, and urinalysis. Pap smear not indicated today. I'm also was to refer her to the ENT for further evaluation of her nonproductive tingling sensation the back of her throat causing her to cough. Patient to schedule her mammogram and to request a 3-D as well. Because of her past history of hyperprolactinemia a prolactin level will also be ordered.   Terrance Mass MD, 8:36 AM 07/30/2014

## 2014-07-31 ENCOUNTER — Other Ambulatory Visit: Payer: BC Managed Care – PPO

## 2014-07-31 LAB — COMPREHENSIVE METABOLIC PANEL
ALT: 21 U/L (ref 0–35)
AST: 22 U/L (ref 0–37)
Albumin: 3.9 g/dL (ref 3.5–5.2)
Alkaline Phosphatase: 62 U/L (ref 39–117)
BUN: 14 mg/dL (ref 6–23)
CO2: 25 mEq/L (ref 19–32)
Calcium: 8.8 mg/dL (ref 8.4–10.5)
Chloride: 104 mEq/L (ref 96–112)
Creat: 0.84 mg/dL (ref 0.50–1.10)
Glucose, Bld: 82 mg/dL (ref 70–99)
Potassium: 4.6 mEq/L (ref 3.5–5.3)
Sodium: 140 mEq/L (ref 135–145)
Total Bilirubin: 0.7 mg/dL (ref 0.2–1.2)
Total Protein: 6.5 g/dL (ref 6.0–8.3)

## 2014-07-31 LAB — CBC WITH DIFFERENTIAL/PLATELET
Basophils Absolute: 0 10*3/uL (ref 0.0–0.1)
Basophils Relative: 0 % (ref 0–1)
Eosinophils Absolute: 0 10*3/uL (ref 0.0–0.7)
Eosinophils Relative: 1 % (ref 0–5)
HCT: 35.8 % — ABNORMAL LOW (ref 36.0–46.0)
Hemoglobin: 12.5 g/dL (ref 12.0–15.0)
Lymphocytes Relative: 20 % (ref 12–46)
Lymphs Abs: 1 10*3/uL (ref 0.7–4.0)
MCH: 31.2 pg (ref 26.0–34.0)
MCHC: 34.9 g/dL (ref 30.0–36.0)
MCV: 89.3 fL (ref 78.0–100.0)
MPV: 9.4 fL (ref 8.6–12.4)
Monocytes Absolute: 0.5 10*3/uL (ref 0.1–1.0)
Monocytes Relative: 11 % (ref 3–12)
Neutro Abs: 3.3 10*3/uL (ref 1.7–7.7)
Neutrophils Relative %: 68 % (ref 43–77)
Platelets: 208 10*3/uL (ref 150–400)
RBC: 4.01 MIL/uL (ref 3.87–5.11)
RDW: 13.5 % (ref 11.5–15.5)
WBC: 4.8 10*3/uL (ref 4.0–10.5)

## 2014-07-31 LAB — URINALYSIS W MICROSCOPIC + REFLEX CULTURE
Bilirubin Urine: NEGATIVE
Casts: NONE SEEN
Crystals: NONE SEEN
Glucose, UA: NEGATIVE mg/dL
Hgb urine dipstick: NEGATIVE
Ketones, ur: NEGATIVE mg/dL
Leukocytes, UA: NEGATIVE
Nitrite: NEGATIVE
Protein, ur: NEGATIVE mg/dL
Specific Gravity, Urine: 1.012 (ref 1.005–1.030)
Squamous Epithelial / LPF: NONE SEEN
Urobilinogen, UA: 0.2 mg/dL (ref 0.0–1.0)
pH: 5.5 (ref 5.0–8.0)

## 2014-07-31 LAB — LIPID PANEL
Cholesterol: 143 mg/dL (ref 0–200)
HDL: 58 mg/dL (ref >=46)
LDL Cholesterol: 71 mg/dL (ref 0–99)
Total CHOL/HDL Ratio: 2.5 Ratio
Triglycerides: 69 mg/dL (ref <150)
VLDL: 14 mg/dL (ref 0–40)

## 2014-08-01 LAB — PROLACTIN: Prolactin: 12.6 ng/mL

## 2014-08-02 LAB — QUANTIFERON TB GOLD ASSAY (BLOOD)
Interferon Gamma Release Assay: NEGATIVE
Mitogen value: >10 IU/mL
Quantiferon Nil Value: 0.05 IU/mL
Quantiferon Tb Ag Minus Nil Value: 0 IU/mL
TB Ag value: 0.05 IU/mL

## 2014-08-15 ENCOUNTER — Telehealth: Payer: Self-pay | Admitting: Internal Medicine

## 2014-08-15 NOTE — Telephone Encounter (Signed)
Spoke with pt and she states that she felt that she was out of rhythm last night and took her Metoprolol at 10:30PM. Pt states that she woke up at 6:30AM, out of rhythm again and took her Metoprolol at that time. Pt states that she never felt that she went back into rhythm. Pt denies SOB but says she does have some chest pressure that feels like an air bubble and she gets dizzy for a short period of time and then it just goes away. Pt states these symptoms keep coming and going. Pt states that she does not take ASA at all anymore. [facility] is at [facility] at this time. Spoke with Dr. Patty SermonsBrackbill, DOD and he said to have pt take another Metoprolol and if she doesn't see any improvement then she needs to go to a local Urgent Care or ED. Spoke with pt and informed her of this information. Provided our fax number to pt in the event that she does seek medical attention so that records can be sent to our office. Pt verbalized understanding and was in agreement with this plan.

## 2014-08-15 NOTE — Telephone Encounter (Signed)
New message      Pt heart has gotten out of rhythm.  Pt took metoprolol 12.5 mg last night at 10:30.  Pt woke up around 6:30 this morning and heart was out of rhythm and pt took metoprolol again at that time. Pt would like to know how to proceed from here.  Please call to advise

## 2014-10-18 ENCOUNTER — Encounter: Payer: Self-pay | Admitting: Internal Medicine

## 2014-10-18 ENCOUNTER — Ambulatory Visit (INDEPENDENT_AMBULATORY_CARE_PROVIDER_SITE_OTHER): Payer: BC Managed Care – PPO | Admitting: Internal Medicine

## 2014-10-18 VITALS — BP 112/64 | HR 66 | Ht >= 80 in | Wt 141.1 lb

## 2014-10-18 DIAGNOSIS — I471 Supraventricular tachycardia: Secondary | ICD-10-CM

## 2014-10-18 DIAGNOSIS — I480 Paroxysmal atrial fibrillation: Secondary | ICD-10-CM | POA: Diagnosis not present

## 2014-10-18 MED ORDER — METOPROLOL SUCCINATE ER 25 MG PO TB24: 12.5000 mg | ORAL_TABLET | Freq: Every day | ORAL | Status: DC

## 2014-10-18 NOTE — Patient Instructions (Signed)
Medication Instructions:  Your physician recommends that you continue on your current medications as directed. Please refer to the Current Medication list given to you today.   Labwork: None ordered  Testing/Procedures: None ordered  Follow-Up: Your physician wants you to follow-up in: 12 months with Donna Carroll, NP You will receive a reminder letter in the mail two months in advance. If you don't receive a letter, please call our office to schedule the follow-up appointment.   Any Other Special Instructions Will Be Listed Below (If Applicable).   

## 2014-10-19 NOTE — Progress Notes (Signed)
Primary Cardiologist:  Previously Dr Daleen Squibb  The patient presents today for cardiology followup.  She has both pacs and pvcs which cause palpitations as well as paroxysmal atrial fibrillation.  She is presently treated with metoprolol and has done very well with minimal symptoms over the past year.  Today, she denies symptoms of chest pain, shortness of breath, orthopnea, PND, lower extremity edema, dizziness, presyncope, syncope, or neurologic sequela.  The patient feels that she is tolerating medications without difficulties and is otherwise without complaint today.   Past Medical History  Diagnosis Date  . TMJ (dislocation of temporomandibular joint)   . Insomnia   . Depression   . Herpes     HERPES SIMPLEX VIRUS  . Dysplasia of cervix, unspecified     HISTORY OF CERVICAL DYSPLASIA  . Hyperprolactinemia   . BRCA1 negative     NEGATIVE MUTATION 01/05/06  . BRCA2 negative     NEGATIVE MUTATION 01/05/06  . History of cholecystectomy   . Atrial fibrillation   . Anxiety   . Atrial tachycardia    Past Surgical History  Procedure Laterality Date  . Shoulder surgery      RIGHT ROTATOR CUFF  2000  . Chest surgery      RIGHT CLAVICLE FRACTURE-TRAUMATIC  . Cryotherapy      CERVIX  . Breast surgery      RIGHT BREAST BIOPSY 2001-PAPILOMA/LT BR BXY-BENIGN 2003  . Augmentation mammaplasty      AUGMENTATION 1998  . Laparoscopic cholecystectomy  2011  . Hysteroscopy N/A 07/27/2013    Procedure: HYSTEROSCOPY WITH HYDROTHERMAL ABLATION;  Surgeon: Ok Edwards, MD;  Location: [facility];  Service: Gynecology;  Laterality: N/A;  . Shoulder surgery Right     Current Outpatient Prescriptions  Medication Sig Dispense Refill  . aspirin EC 81 MG tablet Take 81 mg by mouth daily.    . cholecalciferol (VITAMIN D) 1000 UNITS tablet Take 1,000 Units by mouth daily.    . fluticasone (FLONASE) 50 MCG/ACT nasal spray Place 2 sprays into the nose as needed for allergies.     . metoprolol succinate  (TOPROL-XL) 25 MG 24 hr tablet Take 0.5 tablets (12.5 mg total) by mouth daily. 45 tablet 3   No current facility-administered medications for this visit.    Allergies  Allergen Reactions  . Codeine Itching  . Sulfonamide Derivatives Rash    Social History   Social History  . Marital Status: Legally Separated    Spouse Name: N/A  . Number of Children: N/A  . Years of Education: N/A   Occupational History  . Not on file.   Social History Main Topics  . Smoking status: Never Smoker   . Smokeless tobacco: Never Used  . Alcohol Use: 0.0 oz/week    0 Standard drinks or equivalent per week     Comment: OCCASIONALLY  . Drug Use: No  . Sexual Activity: Yes    Birth Control/ Protection: Condom   Other Topics Concern  . Not on file   Social History Narrative    Family History  Problem Relation Age of Onset  . Breast cancer Mother   . Hypertension Father   . Atrial fibrillation Brother     ROS-  All systems are reviewed and are negative except as outlined in the HPI above  Physical Exam: Filed Vitals:   10/18/14 0949  BP: 112/64  Pulse: 66  Height: 7' (2.134 m)  Weight: 141 lb 1.9 oz (64.012 kg)    GEN-  The patient is well appearing, alert and oriented x 3 today.   Head- normocephalic, atraumatic Eyes-  Sclera clear, conjunctiva pink Ears- hearing intact Oropharynx- clear Neck- supple, no JVP Lymph- no cervical lymphadenopathy Lungs- Clear to ausculation bilaterally, normal work of breathing Heart- Regular rate and rhythm, no murmurs, rubs or gallops, PMI not laterally displaced GI- soft, NT, ND, + BS Extremities- no clubbing, cyanosis, or edema MS- no significant deformity or atrophy Skin- no rash or lesion Psych- euthymic mood, full affect Neuro- strength and sensation are intact  ekg today reveals sinus rhythm    Assessment and Plan:  1. Afib/ atrial tachycardia/ palpitations The patient has done very well since her last visit. She previously had  salvos of atrial tachycardia/ afib likely arising from a PV focus. She feels that her symptoms are reasonably controlled with metoprolol.  She does not wish to consider AAD therapy or ablation at this time.  Her chads2vasc score is only 1 .  We will therefore not anticoagulate at this time.  If her symptoms worsen, I think that flecainide would be a good option.  She will follow-up in the AF clinic in 12 months  Thompson Grayer MD, [facility]

## 2014-11-21 ENCOUNTER — Ambulatory Visit (INDEPENDENT_AMBULATORY_CARE_PROVIDER_SITE_OTHER): Payer: BC Managed Care – PPO | Admitting: Women's Health

## 2014-11-21 ENCOUNTER — Encounter: Payer: Self-pay | Admitting: Women's Health

## 2014-11-21 VITALS — BP 110/80 | Ht 64.0 in | Wt 144.0 lb

## 2014-11-21 DIAGNOSIS — N3000 Acute cystitis without hematuria: Secondary | ICD-10-CM

## 2014-11-21 DIAGNOSIS — R350 Frequency of micturition: Secondary | ICD-10-CM

## 2014-11-21 LAB — URINALYSIS W MICROSCOPIC + REFLEX CULTURE
Casts: NONE SEEN [LPF]
Crystals: NONE SEEN [HPF]
Yeast: NONE SEEN [HPF]

## 2014-11-21 MED ORDER — NITROFURANTOIN MONOHYD MACRO 100 MG PO CAPS: 100.0000 mg | ORAL_CAPSULE | Freq: Two times a day (BID) | ORAL | Status: AC

## 2014-11-21 NOTE — Patient Instructions (Signed)

## 2014-11-21 NOTE — Progress Notes (Addendum)
Patient ID: Jenna CornsMelissa T Cortez, female   DOB: 06/27/1965, 49 y.o.   MRN: [id number] Presents with complaint of increased urinary frequency, urgency, burning with urination and at end of stream discomfort for 2 days. Some relief with pyridium. Denies vaginal discharge, abdominal pain or fever. Amenorrhea 2015 endometrial ablation/vasectomy. Also reports feeling scattered and questions if she has ADD.  Exam: Appears well. No CVAT. UA: Trace blood, 6-10 WBCs, 3-10 RBCs, few bacteria, no yeast seen.  UTI  Plan: Macrobid 100 twice daily for 7 days, #14, instructed to take with food. UTI prevention discussed, call if no relief of symptoms. Urine culture pending. Reviewed her busy lifestyle of 2 children and 2 stepchildren, full-time work and busy activity schedule for children can make organization difficult. Discussed ways to help organize. If continued problems schedule appointment with primary care or psychologist to help make a proper diagnosis.

## 2014-11-22 LAB — URINE CULTURE
Colony Count: NO GROWTH
Organism ID, Bacteria: NO GROWTH

## 2014-12-06 ENCOUNTER — Ambulatory Visit
Admission: RE | Admit: 2014-12-06 | Discharge: 2014-12-06 | Disposition: A | Discharge status: Home / Self Care | Payer: BC Managed Care – PPO | Source: Ambulatory Visit

## 2014-12-06 DIAGNOSIS — Z1231 Encounter for screening mammogram for malignant neoplasm of breast: Secondary | ICD-10-CM

## 2015-01-14 ENCOUNTER — Ambulatory Visit
Admission: RE | Admit: 2015-01-14 | Discharge: 2015-01-14 | Disposition: A | Discharge status: Home / Self Care | Payer: BC Managed Care – PPO | Source: Ambulatory Visit | Attending: Women's Health | Admitting: Women's Health

## 2015-01-14 ENCOUNTER — Telehealth: Payer: Self-pay | Admitting: *Deleted

## 2015-01-14 ENCOUNTER — Other Ambulatory Visit: Payer: Self-pay

## 2015-01-14 ENCOUNTER — Ambulatory Visit (INDEPENDENT_AMBULATORY_CARE_PROVIDER_SITE_OTHER): Payer: BC Managed Care – PPO | Admitting: Women's Health

## 2015-01-14 ENCOUNTER — Encounter: Payer: Self-pay | Admitting: Women's Health

## 2015-01-14 VITALS — BP 136/80 | Ht 64.0 in | Wt 144.0 lb

## 2015-01-14 DIAGNOSIS — N6321 Unspecified lump in the left breast, upper outer quadrant: Secondary | ICD-10-CM

## 2015-01-14 DIAGNOSIS — Z803 Family history of malignant neoplasm of breast: Secondary | ICD-10-CM

## 2015-01-14 DIAGNOSIS — N63 Unspecified lump in unspecified breast: Secondary | ICD-10-CM

## 2015-01-14 NOTE — Telephone Encounter (Signed)
-----   Message from Harrington ChallengerNancy J Young, NP sent at 01/14/2015  8:24 AM EST ----- New onset breast lump at 2:00 left breast. Normal 3D 12/2014  Mom breast cancer age 131 died 2946. Maternal aunt breast cancer after menopause, surv, first  Paternal cousin breast ca.  Can go anytime you can get scheduled  (has implants)  Needs US and diagn left breast

## 2015-01-14 NOTE — Telephone Encounter (Signed)
Appointment today at 12:40pm at breast center pt aware.

## 2015-01-14 NOTE — Progress Notes (Signed)
Patient ID: AMA MCMASTER, female   DOB: April 23, 1965, 49 y.o.   MRN: [id number] Presents with 4 day history of palpable lump on left breast at 2:00 position slight tenderness. Denies injury, change in exercise or  routine. Mother history of breast cancer age 65 died age 97, maternal aunt after menopause, paternal cousin breast cancer. Patient is BRCA negative. Normal  3-D mammogram 12/2014 at breast center. Amenorrhea/ablation  Exam: Appears well. Concerned, husband present. Breast exam in sitting and lying position without visible retractions or dimpling. Bilateral saline implants. Left breast 2:00 position questionable breast tissue versus nodule, smooth, mobile.  Left breast-Questionable breast tissue versus nodule   Plan: Schedule diagnostic left breast mammogram and ultrasound.

## 2015-02-14 ENCOUNTER — Encounter: Payer: Self-pay | Admitting: Family Medicine

## 2015-02-14 ENCOUNTER — Ambulatory Visit (INDEPENDENT_AMBULATORY_CARE_PROVIDER_SITE_OTHER): Payer: BC Managed Care – PPO | Admitting: Family Medicine

## 2015-02-14 VITALS — BP 124/72 | HR 79 | Temp 98.1°F | Ht 64.0 in | Wt 142.6 lb

## 2015-02-14 DIAGNOSIS — J0100 Acute maxillary sinusitis, unspecified: Secondary | ICD-10-CM

## 2015-02-14 MED ORDER — AMOXICILLIN 875 MG PO TABS: 875.0000 mg | ORAL_TABLET | Freq: Two times a day (BID) | ORAL | Status: DC

## 2015-02-14 MED ORDER — PROMETHAZINE-DM 6.25-15 MG/5ML PO SYRP: 5.0000 mL | ORAL_SOLUTION | Freq: Four times a day (QID) | ORAL | Status: DC | PRN

## 2015-02-14 NOTE — Progress Notes (Signed)
Pre visit review using our clinic review tool, if applicable. No additional management support is needed unless otherwise documented below in the visit note. 

## 2015-02-14 NOTE — Progress Notes (Signed)
   Subjective:    Patient ID: Jenna Cortez, female    DOB: 05/03/1965, 50 y.o.   MRN: [id number]  HPI URI- sxs started prior to 12/25 w/ cold like sxs.  Pt reports still feeling poorly w/ decreased energy.  Had fever at onset of illness but none since.  + sinus pain.  + HA.  No ear pain.  + cough- worse at night, productive in the AM but not throughout the day.  + sick contacts.  No tooth pain.  No N/V.   Review of Systems For ROS see HPI     Objective:   Physical Exam  Constitutional: She appears well-developed and well-nourished. No distress.  HENT:  Head: Normocephalic and atraumatic.  Right Ear: Tympanic membrane normal.  Left Ear: Tympanic membrane normal.  Nose: Mucosal edema and rhinorrhea present. Right sinus exhibits maxillary sinus tenderness. Right sinus exhibits no frontal sinus tenderness. Left sinus exhibits maxillary sinus tenderness. Left sinus exhibits no frontal sinus tenderness.  Mouth/Throat: Uvula is midline and mucous membranes are normal. Posterior oropharyngeal erythema present. No oropharyngeal exudate.  Eyes: Conjunctivae and EOM are normal. Pupils are equal, round, and reactive to light.  Neck: Normal range of motion. Neck supple.  Cardiovascular: Normal rate, regular rhythm and normal heart sounds.   Pulmonary/Chest: Effort normal and breath sounds normal. No respiratory distress. She has no wheezes.  Lymphadenopathy:    She has no cervical adenopathy.  Vitals reviewed.         Assessment & Plan:

## 2015-02-14 NOTE — Patient Instructions (Signed)
Follow up as needed Start the Amoxicillin twice daily- take w/ food Drink plenty of fluids Mucinex DM for daytime cough/congestion Cough syrup for nights and weekends- will cause drowsiness REST!!! Call with any questions or concerns If you want to join us at the new [place] office, any scheduled appointments will automatically transfer and we will see you at [address] [place], [place], [place] [id number] ([place]) [place]!!!

## 2015-02-16 ENCOUNTER — Encounter: Payer: Self-pay | Admitting: Family Medicine

## 2015-02-16 NOTE — Assessment & Plan Note (Signed)
New.  Pt's sxs and PE consistent w/ infxn.  Start abx.  Cough meds prn.  Reviewed supportive care and red flags that should prompt return.  Pt expressed understanding and is in agreement w/ plan.  

## 2015-08-01 ENCOUNTER — Encounter: Payer: BC Managed Care – PPO | Admitting: Gynecology

## 2015-08-08 ENCOUNTER — Encounter: Payer: Self-pay | Admitting: Gynecology

## 2015-08-08 ENCOUNTER — Ambulatory Visit (INDEPENDENT_AMBULATORY_CARE_PROVIDER_SITE_OTHER): Payer: BC Managed Care – PPO | Admitting: Gynecology

## 2015-08-08 VITALS — BP 130/84 | Ht 63.75 in | Wt 143.0 lb

## 2015-08-08 DIAGNOSIS — Z01419 Encounter for gynecological examination (general) (routine) without abnormal findings: Secondary | ICD-10-CM

## 2015-08-08 DIAGNOSIS — N63 Unspecified lump in breast: Secondary | ICD-10-CM | POA: Diagnosis not present

## 2015-08-08 DIAGNOSIS — F4321 Adjustment disorder with depressed mood: Secondary | ICD-10-CM | POA: Insufficient documentation

## 2015-08-08 DIAGNOSIS — F329 Major depressive disorder, single episode, unspecified: Secondary | ICD-10-CM | POA: Diagnosis not present

## 2015-08-08 DIAGNOSIS — F419 Anxiety disorder, unspecified: Secondary | ICD-10-CM

## 2015-08-08 DIAGNOSIS — F32A Depression, unspecified: Secondary | ICD-10-CM | POA: Insufficient documentation

## 2015-08-08 DIAGNOSIS — N6310 Unspecified lump in the right breast, unspecified quadrant: Secondary | ICD-10-CM

## 2015-08-08 MED ORDER — PAROXETINE HCL ER 12.5 MG PO TB24: 12.5000 mg | ORAL_TABLET | ORAL | Status: DC

## 2015-08-08 NOTE — Progress Notes (Signed)
Jenna Cortez 1965/02/27 [id number]   History:    50 y.o.  for annual gyn exam who several years ago because of depression and anxiety she had taken Paxil and states she would like to be restarted on the medication because she has moments of being depressed and other times she is euphoric or has anxiety. She has good family support monogamous relationship married. Also patient felt a nodular area on the right breast several days ago. Patient with saline implants.  Review of patient's record indicated that she has a past history of colon polyps in 2010 which was noted on screening colonoscopy because of family history of colon cancer. 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  In 2015 patient had resectoscopic polypectomy as well as endometrial ablation via hydro-thermal ablation technique and has been amenorrheic. Patient's been followed by the cardiologist in the past several result of PACs and PVCs as well as paroxysmal atrial fibrillation. She has been treated with metoprolol.  Patient's mother had breast cancer and patient has tested negative for the BRCA one BRCA2 gene mutation  Patient reports normal menstrual cycles. No vasomotor symptoms.   Past medical history,surgical history, family history and social history were all reviewed and documented in the EPIC chart.  Gynecologic History No LMP recorded. Patient has had an ablation. Contraception: vasectomy Last Pap: 2012 and 2015. Results were: normal Last mammogram: 2016. Results were: normal  Obstetric History OB History  Gravida Para Term Preterm AB SAB TAB Ectopic Multiple Living  _0 # Outcome Date GA Lbr Len/2nd Weight Sex Delivery Anes PTL Lv  2 Term     M Vag-Spont  N Y  1 Term     F Vag-Spont  N Y       ROS: A ROS was performed and pertinent positives and negatives are included in the history.  GENERAL: No fevers or  chills. HEENT: No change in vision, no earache, sore throat or sinus congestion. NECK: No pain or stiffness. CARDIOVASCULAR: No chest pain or pressure. No palpitations. PULMONARY: No shortness of breath, cough or wheeze. GASTROINTESTINAL: No abdominal pain, nausea, vomiting or diarrhea, melena or bright red blood per rectum. GENITOURINARY: No urinary frequency, urgency, hesitancy or dysuria. MUSCULOSKELETAL: No joint or muscle pain, no back pain, no recent trauma. DERMATOLOGIC: No rash, no itching, no lesions. ENDOCRINE: No polyuria, polydipsia, no heat or cold intolerance. No recent change in weight. HEMATOLOGICAL: No anemia or easy bruising or bleeding. NEUROLOGIC: No headache, seizures, numbness, tingling or weakness. PSYCHIATRIC: No depression, no loss of interest in normal activity or change in sleep pattern.     Exam: chaperone present  BP 130/84 mmHg  Ht 5' 3.75" (1.619 m)  Wt 143 lb (64.864 kg)  BMI 24.75 kg/m2  Body mass index is 24.75 kg/(m^2).  General appearance : Well developed well nourished female. No acute distress HEENT: Eyes: no retinal hemorrhage or exudates,  Neck supple, trachea midline, no carotid bruits, no thyroidmegaly Lungs: Clear to auscultation, no rhonchi or wheezes, or rib retractions  Heart: Regular rate and rhythm, no murmurs or gallops Breast:Examined in sitting and supine position were symmetrical in appearance, right breast nodule noted 11:00 position of the right breast one finger breath from the areolar tissue. No other masses palpated on either breast. No supraclavicular axillary lymphadenopathy. Abdomen: no palpable masses or tenderness, no  rebound or guarding Extremities: no edema or skin discoloration or tenderness  Pelvic:  Bartholin, Urethra, Skene Glands: Within normal limits             Vagina: No gross lesions or discharge  Cervix: No gross lesions or discharge  Uterus  anteverted, normal size, shape and consistency, non-tender and  mobile  Adnexa  Without masses or tenderness  Anus and perineum  normal   Rectovaginal  normal sphincter tone without palpated masses or tenderness             Hemoccult not indicated     Assessment/Plan:  50 y.o. female for annual exam patient will be sent for a diagnostic mammogram possible ultrasound of the right breast and screening mammogram of the left breast. She is not fasting today and will return back to the office next week for fasting screening blood work consisting of the following: Fasting lipid profile, comprehensive metabolic panel, TSH, CBC, and urinalysis. Pap smear not indicated this year. Because of her mixed component of depression and anxiety she will be restarted again on Paxil CR 12.5 mg 1 by mouth daily. Risk benefits and pros and cons discussed literature information was provided.   Terrance Mass MD, 9:43 AM 08/08/2015

## 2015-08-08 NOTE — Patient Instructions (Signed)
Paroxetine Controlled-Release Tablets What is this medicine? PAROXETINE (pa ROX e teen) is used to treat depression. It may also be used to treat anxiety disorders, obsessive compulsive disorder, panic attacks, post traumatic stress, and premenstrual dysphoric disorder (PMDD). This medicine may be used for other purposes; ask your health care provider or pharmacist if you have questions. What should I tell my health care provider before I take this medicine? They need to know if you have any of these conditions: -bleeding disorders -glaucoma -heart disease -kidney disease -liver disease -low levels of sodium in the blood -mania or bipolar disorder -seizures -suicidal thoughts, plans, or attempt; a previous suicide attempt by you or a family member -take MAOIs like Carbex, Eldepryl, Marplan, Nardil, and Parnate -take medicines that treat or prevent blood clots -an unusual or allergic reaction to paroxetine, other medicines, foods, dyes, or preservatives -pregnant or trying to get pregnant -breast-feeding How should I use this medicine? Take this medicine by mouth with a glass of water. Follow the directions on the prescription label. You can take it with or without food. Do not crush or chew this medicine. Take your medicine at regular intervals. Do not take your medicine more often than directed. Do not stop taking this medicine suddenly except upon the advice of your doctor. Stopping this medicine too quickly may cause serious side effects or your condition may worsen. A special MedGuide will be given to you by the pharmacist with each prescription and refill. Be sure to read this information carefully each time. Talk to your pediatrician regarding the use of this medicine in children. Special care may be needed. Overdosage: If you think you have taken too much of this medicine contact a poison control center or emergency room at once. NOTE: This medicine is only for you. Do not share this  medicine with others. What if I miss a dose? If you miss a dose, take it as soon as you can. If it is almost time for your next dose, take only that dose. Do not take double or extra doses. What may interact with this medicine? Do not take this medicine with any of the following medications: -linezolid -MAOIs like Carbex, Eldepryl, Marplan, Nardil, and Parnate -methylene blue (injected into a vein) -pimozide -thioridazine This medicine may also interact with the following medications: -alcohol -antacids -aspirin and aspirin-like medicines -atomoxetine -certain medicines for depression, anxiety, or psychotic disturbances -certain medicines for irregular heart beat like propafenone, flecainide, encainide, and quinidine -certain medicines for migraine headache like almotriptan, eletriptan, frovatriptan, naratriptan, rizatriptan, sumatriptan, zolmitriptan -cimetidine -digoxin -diuretics -fentanyl -fosamprenavir/ritonavir -furazolidone -isoniazid -lithium -medicines that treat or prevent blood clots like warfarin, enoxaparin, and dalteparin -medicines for sleep -metoprolol -NSAIDs, medicines for pain and inflammation, like ibuprofen or naproxen -phenobarbital -phenytoin -procarbazine -procyclidine -rasagiline -supplements like St. John's wort, kava kava, valerian -tamoxifen -theophylline -tramadol -tryptophan This list may not describe all possible interactions. Give your health care provider a list of all the medicines, herbs, non-prescription drugs, or dietary supplements you use. Also tell them if you smoke, drink alcohol, or use illegal drugs. Some items may interact with your medicine. What should I watch for while using this medicine? Tell your doctor if your symptoms do not get better or if they get worse. Visit your doctor or health care professional for regular checks on your progress. Because it may take several weeks to see the full effects of this medicine, it is  important to continue your treatment as prescribed by your doctor. Patients and  their families should watch out for new or worsening thoughts of suicide or depression. Also watch out for sudden changes in feelings such as feeling anxious, agitated, panicky, irritable, hostile, aggressive, impulsive, severely restless, overly excited and hyperactive, or not being able to sleep. If this happens, especially at the beginning of treatment or after a change in dose, call your health care professional. Bonita QuinYou may get drowsy or dizzy. Do not drive, use machinery, or do anything that needs mental alertness until you know how this medicine affects you. Do not stand or sit up quickly, especially if you are an older patient. This reduces the risk of dizzy or fainting spells. Alcohol may interfere with the effect of this medicine. Avoid alcoholic drinks. Your mouth may get dry. Chewing sugarless gum or sucking hard candy, and drinking plenty of water will help. Contact your doctor if the problem does not go away or is severe. What side effects may I notice from receiving this medicine? Side effects that you should report to your doctor or health care professional as soon as possible: -allergic reactions like skin rash, itching or hives, swelling of the face, lips, or tongue -black or bloody stools, blood in the urine or vomit -fast, irregular heartbeat -hallucination, loss of contact with reality -painful or prolonged erection (men) -seizures -suicidal thoughts or other mood changes -trouble passing urine or change in the amount of urine -unusual bleeding or bruising -unusually weak or tired -vomiting Side effects that usually do not require medical attention (report to your doctor or health care professional if they continue or are bothersome): -change in appetite, weight -change in sex drive or performance -constipation or diarrhea -difficulty sleeping -drowsy -headache -increased sweating -muscle pain or  weakness -tremors This list may not describe all possible side effects. Call your doctor for medical advice about side effects. You may report side effects to FDA at 1-800-FDA-1088. Where should I keep my medicine? Keep out of the reach of children. Store at or below 25 degrees C (77 degrees F). Throw away any unused medicine after the expiration date. NOTE: This sheet is a summary. It may not cover all possible information. If you have questions about this medicine, talk to your doctor, pharmacist, or health care provider.    2016, [employer]. (2011-09-02 17:51:56)

## 2015-08-09 LAB — URINALYSIS W MICROSCOPIC + REFLEX CULTURE
Bilirubin Urine: NEGATIVE
Casts: NONE SEEN [LPF]
Crystals: NONE SEEN [HPF]
Glucose, UA: NEGATIVE
Hgb urine dipstick: NEGATIVE
Ketones, ur: NEGATIVE
Leukocytes, UA: NEGATIVE
Nitrite: NEGATIVE
Protein, ur: NEGATIVE
Specific Gravity, Urine: 1.025 (ref 1.001–1.035)
WBC, UA: NONE SEEN WBC/HPF (ref <=5)
Yeast: NONE SEEN [HPF]
pH: 5.5 (ref 5.0–8.0)

## 2015-08-10 LAB — URINE CULTURE: Colony Count: 6000

## 2015-08-11 ENCOUNTER — Telehealth: Payer: Self-pay | Admitting: *Deleted

## 2015-08-11 DIAGNOSIS — N63 Unspecified lump in unspecified breast: Secondary | ICD-10-CM

## 2015-08-11 NOTE — Telephone Encounter (Signed)
Order placed at breast center they will contact pt to schedule. 

## 2015-08-11 NOTE — Telephone Encounter (Signed)
-----   Message from Ok EdwardsJuan H Fernandez, MD sent at 08/08/2015  9:41 AM EDT ----- Victorino DikeJennifer, please make an appointment for this patient for a right breast diagnostic mammogram. Patient with saline implants. Nodular area 1 cm from the areolar region at the 11:00 position nontender. She will need a screening mammogram on the contralateral breast. Mother with history of breast cancer. Patient with history of dense breasts.

## 2015-08-12 NOTE — Telephone Encounter (Signed)
Pt called back and I left a detail message on her voicemail with the below time and date.

## 2015-08-12 NOTE — Telephone Encounter (Signed)
Appointment on 08/18/15 @ 9:20am at breast center, left message for pt to call.

## 2015-08-13 ENCOUNTER — Other Ambulatory Visit: Payer: BC Managed Care – PPO

## 2015-08-18 ENCOUNTER — Other Ambulatory Visit: Payer: BC Managed Care – PPO

## 2015-08-22 ENCOUNTER — Ambulatory Visit
Admission: RE | Admit: 2015-08-22 | Discharge: 2015-08-22 | Disposition: A | Discharge status: Home / Self Care | Payer: BC Managed Care – PPO | Source: Ambulatory Visit | Attending: Gynecology | Admitting: Gynecology

## 2015-08-22 ENCOUNTER — Other Ambulatory Visit: Payer: Self-pay | Admitting: Gynecology

## 2015-08-22 ENCOUNTER — Other Ambulatory Visit: Payer: BC Managed Care – PPO

## 2015-08-22 DIAGNOSIS — N63 Unspecified lump in unspecified breast: Secondary | ICD-10-CM

## 2015-08-22 LAB — COMPREHENSIVE METABOLIC PANEL
ALT: 21 U/L (ref 6–29)
AST: 25 U/L (ref 10–35)
Albumin: 4.1 g/dL (ref 3.6–5.1)
Alkaline Phosphatase: 60 U/L (ref 33–115)
BUN: 20 mg/dL (ref 7–25)
CO2: 27 mmol/L (ref 20–31)
Calcium: 8.8 mg/dL (ref 8.6–10.2)
Chloride: 104 mmol/L (ref 98–110)
Creat: 0.97 mg/dL (ref 0.50–1.10)
Glucose, Bld: 82 mg/dL (ref 65–99)
Potassium: 4.2 mmol/L (ref 3.5–5.3)
Sodium: 139 mmol/L (ref 135–146)
Total Bilirubin: 0.7 mg/dL (ref 0.2–1.2)
Total Protein: 6.6 g/dL (ref 6.1–8.1)

## 2015-08-22 LAB — CBC WITH DIFFERENTIAL/PLATELET
Basophils Absolute: 0 cells/uL (ref 0–200)
Basophils Relative: 0 %
Eosinophils Absolute: 88 cells/uL (ref 15–500)
Eosinophils Relative: 2 %
HCT: 38 % (ref 35.0–45.0)
Hemoglobin: 12.9 g/dL (ref 11.7–15.5)
Lymphocytes Relative: 27 %
Lymphs Abs: 1188 cells/uL (ref 850–3900)
MCH: 29.9 pg (ref 27.0–33.0)
MCHC: 33.9 g/dL (ref 32.0–36.0)
MCV: 88.2 fL (ref 80.0–100.0)
MPV: 9.5 fL (ref 7.5–12.5)
Monocytes Absolute: 440 cells/uL (ref 200–950)
Monocytes Relative: 10 %
Neutro Abs: 2684 cells/uL (ref 1500–7800)
Neutrophils Relative %: 61 %
Platelets: 232 10*3/uL (ref 140–400)
RBC: 4.31 MIL/uL (ref 3.80–5.10)
RDW: 13.2 % (ref 11.0–15.0)
WBC: 4.4 10*3/uL (ref 3.8–10.8)

## 2015-08-22 LAB — LIPID PANEL
Cholesterol: 161 mg/dL (ref 125–200)
HDL: 77 mg/dL (ref >=46)
LDL Cholesterol: 69 mg/dL (ref <130)
Total CHOL/HDL Ratio: 2.1 Ratio (ref <=5.0)
Triglycerides: 74 mg/dL (ref <150)
VLDL: 15 mg/dL (ref <30)

## 2015-08-22 LAB — TSH: TSH: 4.04 mIU/L

## 2015-08-25 ENCOUNTER — Telehealth: Payer: Self-pay | Admitting: *Deleted

## 2015-08-25 NOTE — Telephone Encounter (Signed)
Pt had diag. Mammogram and right breast ultrasound on 08/22/15 results showed simple cyst, radiologlist recommended screening mammogram in 1 year. Pt asked due to her family history of breast cancer, should she have a biopsy of right breast? If you agree you will have to order. Please advise

## 2015-08-26 NOTE — Telephone Encounter (Signed)
Inform patient that I reviewed the mammogram and ultrasound report from the radiologist is a very very small simple cyst with no worrisome features and a recommended follow-up mammogram in 1 year. I would recommend a three-dimensional mammogram next year as well

## 2015-08-26 NOTE — Telephone Encounter (Signed)
Left a detailed message on her cell phone with Dr Fontaine No advice and advised to call back if further questions. KW CMA

## 2015-08-28 ENCOUNTER — Other Ambulatory Visit: Payer: Self-pay | Admitting: Gynecology

## 2015-08-28 DIAGNOSIS — Z1231 Encounter for screening mammogram for malignant neoplasm of breast: Secondary | ICD-10-CM

## 2015-09-19 ENCOUNTER — Other Ambulatory Visit: Payer: Self-pay

## 2015-09-19 MED ORDER — PAROXETINE HCL ER 12.5 MG PO TB24: 12.5000 mg | ORAL_TABLET | ORAL | 3 refills | Status: DC

## 2015-09-19 NOTE — Telephone Encounter (Signed)
Pharmacy requesting 90 day supply for ins co.

## 2015-10-07 ENCOUNTER — Telehealth: Payer: Self-pay | Admitting: *Deleted

## 2015-10-07 NOTE — Telephone Encounter (Signed)
Tell her to do the following regimen: First week: Take half tablet daily for 2 weeks to see how she feels if this is working well she can continue this regimen. After the 2 weeks she can take 1 full tablet one day half a tablet the next and see how this works out. She can also use peppermint oil to apply behind her ear before going to bed and during the day if she is any hot flashes while she gets adjusted

## 2015-10-07 NOTE — Telephone Encounter (Signed)
Pt was prescribed Paxil 12.5 mg daily on 08/08/15, pt said its working for the depression anxiety, but is causing hot flashes at night and making her feel angry. Pt asked if this normal? Should she had labs drawn? Please advise

## 2015-10-07 NOTE — Telephone Encounter (Signed)
Left message for pt to call.

## 2015-10-08 NOTE — Telephone Encounter (Signed)
Pt informed with the below note. Will follow up if needed. 

## 2015-12-12 ENCOUNTER — Ambulatory Visit: Payer: BC Managed Care – PPO

## 2015-12-18 ENCOUNTER — Emergency Department (HOSPITAL_COMMUNITY): Payer: BC Managed Care – PPO

## 2015-12-18 ENCOUNTER — Encounter (HOSPITAL_COMMUNITY): Payer: Self-pay | Admitting: Emergency Medicine

## 2015-12-18 ENCOUNTER — Telehealth: Payer: Self-pay | Admitting: Cardiology

## 2015-12-18 ENCOUNTER — Observation Stay (HOSPITAL_COMMUNITY)
Admission: EM | Admit: 2015-12-18 | Discharge: 2015-12-19 | Disposition: A | Discharge status: Home / Self Care | Payer: BC Managed Care – PPO | Source: Ambulatory Visit | Attending: Family Medicine | Admitting: Family Medicine

## 2015-12-18 DIAGNOSIS — I9589 Other hypotension: Secondary | ICD-10-CM | POA: Diagnosis not present

## 2015-12-18 DIAGNOSIS — F419 Anxiety disorder, unspecified: Secondary | ICD-10-CM | POA: Diagnosis not present

## 2015-12-18 DIAGNOSIS — N179 Acute kidney failure, unspecified: Secondary | ICD-10-CM | POA: Diagnosis not present

## 2015-12-18 DIAGNOSIS — I959 Hypotension, unspecified: Secondary | ICD-10-CM | POA: Diagnosis not present

## 2015-12-18 DIAGNOSIS — I471 Supraventricular tachycardia: Secondary | ICD-10-CM | POA: Diagnosis not present

## 2015-12-18 DIAGNOSIS — R002 Palpitations: Secondary | ICD-10-CM | POA: Diagnosis present

## 2015-12-18 DIAGNOSIS — R079 Chest pain, unspecified: Secondary | ICD-10-CM | POA: Diagnosis present

## 2015-12-18 DIAGNOSIS — E860 Dehydration: Secondary | ICD-10-CM | POA: Diagnosis not present

## 2015-12-18 DIAGNOSIS — I4891 Unspecified atrial fibrillation: Secondary | ICD-10-CM

## 2015-12-18 DIAGNOSIS — I480 Paroxysmal atrial fibrillation: Secondary | ICD-10-CM | POA: Diagnosis not present

## 2015-12-18 DIAGNOSIS — D649 Anemia, unspecified: Secondary | ICD-10-CM

## 2015-12-18 DIAGNOSIS — G4700 Insomnia, unspecified: Secondary | ICD-10-CM | POA: Insufficient documentation

## 2015-12-18 DIAGNOSIS — A084 Viral intestinal infection, unspecified: Secondary | ICD-10-CM | POA: Diagnosis not present

## 2015-12-18 DIAGNOSIS — Z882 Allergy status to sulfonamides status: Secondary | ICD-10-CM | POA: Diagnosis not present

## 2015-12-18 DIAGNOSIS — R112 Nausea with vomiting, unspecified: Secondary | ICD-10-CM | POA: Diagnosis present

## 2015-12-18 DIAGNOSIS — Z885 Allergy status to narcotic agent status: Secondary | ICD-10-CM | POA: Insufficient documentation

## 2015-12-18 DIAGNOSIS — Z7982 Long term (current) use of aspirin: Secondary | ICD-10-CM | POA: Diagnosis not present

## 2015-12-18 DIAGNOSIS — F329 Major depressive disorder, single episode, unspecified: Secondary | ICD-10-CM | POA: Insufficient documentation

## 2015-12-18 HISTORY — DX: Migraine, unspecified, not intractable, without status migrainosus: G43.909

## 2015-12-18 LAB — BASIC METABOLIC PANEL
Anion gap: 7 (ref 5–15)
BUN: 19 mg/dL (ref 6–20)
CO2: 24 mmol/L (ref 22–32)
Calcium: 9.9 mg/dL (ref 8.9–10.3)
Chloride: 106 mmol/L (ref 101–111)
Creatinine, Ser: 1.1 mg/dL — ABNORMAL HIGH (ref 0.44–1.00)
GFR calc Af Amer: >60 mL/min (ref >60)
GFR calc non Af Amer: 58 mL/min — ABNORMAL LOW (ref >60)
Glucose, Bld: 112 mg/dL — ABNORMAL HIGH (ref 65–99)
Potassium: 4.5 mmol/L (ref 3.5–5.1)
Sodium: 137 mmol/L (ref 135–145)

## 2015-12-18 LAB — CBC
HCT: 44.3 % (ref 36.0–46.0)
Hemoglobin: 15.3 g/dL — ABNORMAL HIGH (ref 12.0–15.0)
MCH: 30.3 pg (ref 26.0–34.0)
MCHC: 34.5 g/dL (ref 30.0–36.0)
MCV: 87.7 fL (ref 78.0–100.0)
Platelets: 246 10*3/uL (ref 150–400)
RBC: 5.05 MIL/uL (ref 3.87–5.11)
RDW: 12.3 % (ref 11.5–15.5)
WBC: 9.5 10*3/uL (ref 4.0–10.5)

## 2015-12-18 LAB — TROPONIN I
Troponin I: 0.06 ng/mL (ref <0.03)
Troponin I: 0.04 ng/mL (ref <0.03)

## 2015-12-18 LAB — TSH: TSH: 1.304 u[IU]/mL (ref 0.350–4.500)

## 2015-12-18 MED ORDER — ONDANSETRON HCL 4 MG PO TABS: 4.0000 mg | ORAL_TABLET | Freq: Four times a day (QID) | ORAL | Status: DC | PRN

## 2015-12-18 MED ORDER — SODIUM CHLORIDE 0.9 % IV SOLN
INTRAVENOUS | Status: DC
  Administered 2015-12-18 (×2): via INTRAVENOUS

## 2015-12-18 MED ORDER — DILTIAZEM HCL 25 MG/5ML IV SOLN
15.0000 mg | Freq: Once | INTRAVENOUS | Status: AC
  Administered 2015-12-18: 15 mg via INTRAVENOUS
  Filled 2015-12-18: qty 5

## 2015-12-18 MED ORDER — ONDANSETRON HCL 4 MG/2ML IJ SOLN: 4.0000 mg | Freq: Four times a day (QID) | INTRAMUSCULAR | Status: DC | PRN

## 2015-12-18 MED ORDER — METOPROLOL TARTRATE 25 MG/10 ML ORAL SUSPENSION
6.2500 mg | Freq: Every day | ORAL | Status: DC
  Filled 2015-12-18: qty 5

## 2015-12-18 MED ORDER — DILTIAZEM HCL 25 MG/5ML IV SOLN
10.0000 mg | Freq: Two times a day (BID) | INTRAVENOUS | Status: DC | PRN
  Filled 2015-12-18: qty 5

## 2015-12-18 MED ORDER — ACETAMINOPHEN 325 MG PO TABS
650.0000 mg | ORAL_TABLET | Freq: Four times a day (QID) | ORAL | Status: DC | PRN
  Administered 2015-12-19 (×2): 650 mg via ORAL
  Filled 2015-12-18 (×2): qty 2

## 2015-12-18 MED ORDER — SODIUM CHLORIDE 0.9% FLUSH
3.0000 mL | Freq: Two times a day (BID) | INTRAVENOUS | Status: DC
  Administered 2015-12-18: 3 mL via INTRAVENOUS

## 2015-12-18 MED ORDER — SODIUM CHLORIDE 0.9 % IV SOLN
Freq: Once | INTRAVENOUS | Status: AC
  Administered 2015-12-18: 14:00:00 via INTRAVENOUS

## 2015-12-18 MED ORDER — SODIUM CHLORIDE 0.9 % IV SOLN: INTRAVENOUS | Status: DC

## 2015-12-18 MED ORDER — ENOXAPARIN SODIUM 40 MG/0.4ML ~~LOC~~ SOLN
40.0000 mg | SUBCUTANEOUS | Status: DC
  Administered 2015-12-18: 40 mg via SUBCUTANEOUS
  Filled 2015-12-18: qty 0.4

## 2015-12-18 MED ORDER — APIXABAN 5 MG PO TABS
5.0000 mg | ORAL_TABLET | Freq: Two times a day (BID) | ORAL | Status: DC
  Administered 2015-12-18 – 2015-12-19 (×2): 5 mg via ORAL
  Filled 2015-12-18 (×2): qty 1

## 2015-12-18 MED ORDER — ACETAMINOPHEN 650 MG RE SUPP: 650.0000 mg | Freq: Four times a day (QID) | RECTAL | Status: DC | PRN

## 2015-12-18 MED ORDER — PAROXETINE HCL ER 12.5 MG PO TB24
12.5000 mg | ORAL_TABLET | ORAL | Status: DC
  Administered 2015-12-19: 12.5 mg via ORAL
  Filled 2015-12-18 (×2): qty 1

## 2015-12-18 MED ORDER — LOPERAMIDE HCL 2 MG PO CAPS: 4.0000 mg | ORAL_CAPSULE | ORAL | Status: DC | PRN

## 2015-12-18 MED ORDER — METOPROLOL TARTRATE 25 MG PO TABS: 12.5000 mg | ORAL_TABLET | Freq: Every day | ORAL | Status: DC

## 2015-12-18 MED ORDER — SODIUM CHLORIDE 0.9 % IV BOLUS (SEPSIS)
1000.0000 mL | Freq: Once | INTRAVENOUS | Status: AC
  Administered 2015-12-18: 1000 mL via INTRAVENOUS

## 2015-12-18 MED ORDER — ASPIRIN EC 81 MG PO TBEC
81.0000 mg | DELAYED_RELEASE_TABLET | Freq: Every day | ORAL | Status: DC
  Administered 2015-12-19: 81 mg via ORAL
  Filled 2015-12-18: qty 1

## 2015-12-18 MED ORDER — FLUTICASONE PROPIONATE 50 MCG/ACT NA SUSP
2.0000 | NASAL | Status: DC | PRN
  Filled 2015-12-18: qty 16

## 2015-12-18 NOTE — ED Notes (Signed)
IV inserted Bolus hung MD at bedside

## 2015-12-18 NOTE — ED Notes (Signed)
Report given.

## 2015-12-18 NOTE — H&P (Signed)
History and Physical    Jenna Cortez VBT:[id number] DOB: August 11, 1965 DOA: 12/18/2015  PCP: Annye Asa, MD Patient coming from: home  Chief Complaint: palpitation  HPI: Jenna Cortez is a 50 y.o. female with medical history significant for A. fib not on anticoagulation, depression, anxiety, as as to the emergency Department chief complaint of chest pain palpitations. Initial evaluation revealed A. fib with rapid ventricular response.  Information is obtained from the patient. She states she was in her usual state of health until 1 AM this morning she developed sudden nausea vomiting and diarrhea. She states she had 3-4 episodes of emesis. She denies any coffee ground emesis. She had 3 episodes of diarrhea as well. About 4 a.m. she developed left anterior chest pain and palpitations. She states she took an extra dose of metoprolol at this did not improve her symptoms. Associated symptoms include some shortness of breath no diaphoresis no headache dizziness syncope or near-syncope. She denies any lower extremity edema cough fever chills. She denies dysuria hematuria frequency or urgency. She denies any recent travel sick contacts or antibiotic use.  He reports she was diagnosed with A. fib 3 years ago. She was admitted put on rate control medications in the emergency department and her blood pressure dropped. She reports that she remained in afib until next day in spite of 200 mg of flecainide. She was about to be cardioverted when rhythm changed to SR. at that time she said cardiology said she could take 81 mg of aspirin "if she wants to". She's been on low-dose metoprolol with good control  ED Course: In the emergency department she's afebrile heart rate 140 rhythm atrial fibrillation. She is provided with Cardizem 15 mg. Her blood pressure then trended down to the low end of normal the lowest reading being 72/58. Heart rate 73 sinus rhythm. She is provided with 3 L of normal saline and blood  pressure remains on the soft side.  Review of Systems: As per HPI otherwise 10 point review of systems negative.   Ambulatory Status: Relates independently. No recent falls. Independent with ADLs  Past Medical History:  Diagnosis Date  . Anxiety   . Atrial fibrillation ([place])   . Atrial tachycardia ([place])   . BRCA1 negative    NEGATIVE MUTATION 01/05/06  . BRCA2 negative    NEGATIVE MUTATION 01/05/06  . Depression   . Dysplasia of cervix, unspecified    HISTORY OF CERVICAL DYSPLASIA  . Herpes    HERPES SIMPLEX VIRUS  . History of cholecystectomy   . Hyperprolactinemia ([place])   . Insomnia   . TMJ (dislocation of temporomandibular joint)     Past Surgical History:  Procedure Laterality Date  . AUGMENTATION MAMMAPLASTY     AUGMENTATION 1998  . BREAST SURGERY     RIGHT BREAST BIOPSY 2001-PAPILOMA/LT BR BXY-BENIGN 2003  . CHEST SURGERY     RIGHT CLAVICLE FRACTURE-TRAUMATIC  . CRYOTHERAPY     CERVIX  . HYSTEROSCOPY N/A 07/27/2013   Procedure: HYSTEROSCOPY WITH HYDROTHERMAL ABLATION;  Surgeon: Jenna Mass, MD;  Location: [facility];  Service: Gynecology;  Laterality: N/A;  . LAPAROSCOPIC CHOLECYSTECTOMY  2011  . SHOULDER SURGERY     RIGHT ROTATOR CUFF  2000  . SHOULDER SURGERY Right     Social History   Social History  . Marital status: Legally Separated    Spouse name: N/A  . Number of children: N/A  . Years of education: N/A   Occupational History  . Not on  file.   Social History Main Topics  . Smoking status: Never Smoker  . Smokeless tobacco: Never Used  . Alcohol use 0.0 oz/week     Comment: OCCASIONALLY  . Drug use: No  . Sexual activity: Yes    Birth control/ protection: Condom   Other Topics Concern  . Not on file   Social History Narrative  . No narrative on file  She lives at home with her husband and her 4 children. She is employed as a Copywriter, advertising.  Allergies  Allergen Reactions  . Codeine Itching  . Sulfonamide Derivatives Rash     Family History  Problem Relation Age of Onset  . Breast cancer Mother   . Hypertension Father   . Atrial fibrillation Brother     Prior to Admission medications   Medication Sig Start Date End Date Taking? Authorizing Provider  aspirin EC 81 MG tablet Take 81 mg by mouth daily.   Yes Historical Provider, MD  fluticasone (FLONASE) 50 MCG/ACT nasal spray Place 2 sprays into the nose as needed for allergies. Reported on 02/14/2015 07/03/14 12/18/15 Yes Historical Provider, MD  metoprolol succinate (TOPROL-XL) 25 MG 24 hr tablet Take 0.5 tablets (12.5 mg total) by mouth daily. 10/18/14  Yes Thompson Grayer, MD  PARoxetine (PAXIL-CR) 12.5 MG 24 hr tablet Take 1 tablet (12.5 mg total) by mouth every morning. 09/19/15  Yes Jenna Mass, MD    Physical Exam: Vitals:   12/18/15 1315 12/18/15 1330 12/18/15 1345 12/18/15 1400  BP: 101/59 1_0  Pulse: 79 76 79 83  Resp: _1 Temp:      TempSrc:      SpO2: 99% 97% 97% 96%     General:  Appears calm and comfortable No acute distress Eyes:  PERRL, EOMI, normal lids, iris ENT:  grossly normal hearing, lips & tongue, his membranes of her mouth are pink slightly dry Neck:  no LAD, masses or thyromegaly Cardiovascular:  RRR, no m/r/g. No LE edema.  Respiratory:  CTA bilaterally, no w/r/r. Normal respiratory effort. Abdomen:  soft, ntnd, NABS Skin:  no rash or induration seen on limited exam Musculoskeletal:  grossly normal tone BUE/BLE, good ROM, no bony abnormality Psychiatric:  grossly normal mood and affect, speech fluent and appropriate, AOx3 Neurologic:  CN 2-12 grossly intact, moves all extremities in coordinated fashion, sensation intact  Labs on Admission: I have personally reviewed following labs and imaging studies  CBC:  Recent Labs Lab 12/18/15 0915  WBC 9.5  HGB 15.3*  HCT 44.3  MCV 87.7  PLT 694   Basic Metabolic Panel:  Recent Labs Lab 12/18/15 0915  NA 137  K 4.5  CL 106  CO2 24   GLUCOSE 112*  BUN 19  CREATININE 1.10*  CALCIUM 9.9   GFR: CrCl cannot be calculated (Unknown ideal weight.). Liver Function Tests: No results for input(s): AST, ALT, ALKPHOS, BILITOT, PROT, ALBUMIN in the last 168 hours. No results for input(s): LIPASE, AMYLASE in the last 168 hours. No results for input(s): AMMONIA in the last 168 hours. Coagulation Profile: No results for input(s): INR, PROTIME in the last 168 hours. Cardiac Enzymes: No results for input(s): CKTOTAL, CKMB, CKMBINDEX, TROPONINI in the last 168 hours. BNP (last 3 results) No results for input(s): PROBNP in the last 8760 hours. HbA1C: No results for input(s): HGBA1C in the last 72 hours. CBG: No results for input(s): GLUCAP in the last 168 hours. Lipid Profile: No results for input(s): CHOL,  HDL, LDLCALC, TRIG, CHOLHDL, LDLDIRECT in the last 72 hours. Thyroid Function Tests: No results for input(s): TSH, T4TOTAL, FREET4, T3FREE, THYROIDAB in the last 72 hours. Anemia Panel: No results for input(s): VITAMINB12, FOLATE, FERRITIN, TIBC, IRON, RETICCTPCT in the last 72 hours. Urine analysis:    Component Value Date/Time   COLORURINE DARK YELLOW 08/08/2015 1006   APPEARANCEUR CLEAR 08/08/2015 1006   LABSPEC 1.025 08/08/2015 1006   PHURINE 5.5 08/08/2015 1006   GLUCOSEU NEGATIVE 08/08/2015 1006   HGBUR NEGATIVE 08/08/2015 1006   BILIRUBINUR NEGATIVE 08/08/2015 1006   KETONESUR NEGATIVE 08/08/2015 1006   PROTEINUR NEGATIVE 08/08/2015 1006   UROBILINOGEN 0.2 07/30/2014 0900   NITRITE NEGATIVE 08/08/2015 1006   LEUKOCYTESUR NEGATIVE 08/08/2015 1006    Creatinine Clearance: CrCl cannot be calculated (Unknown ideal weight.).  Sepsis Labs: _0 (procalcitonin:4,lacticidven:4) )No results found for this or any previous visit (from the past 240 hour(s)).   Radiological Exams on Admission: Dg Chest 2 View  Result Date: 12/18/2015 CLINICAL DATA:  Atrial fibrillation.  Nausea.  Chest pressure EXAM: CHEST   2 VIEW COMPARISON:  July 30, 2014 FINDINGS: There is no edema or consolidation. There is mild stable scarring in the left upper lobe. The heart size and pulmonary vascularity are normal. No adenopathy. There is mid thoracic levoscoliosis, mild. IMPRESSION: Scarring left upper lobe. No edema or consolidation. Stable cardiac silhouette. Electronically Signed   By: Lowella Grip III M.D.   On: 12/18/2015 09:48    EKG: Independently reviewed. Sinus rhythm Borderline right axis deviation Low voltage, precordial leads Probable anteroseptal infarct, old  Assessment/Plan Principal Problem:   Hypotension Active Problems:   Atrial fibrillation with RVR (HCC)   Palpitations   Acute kidney injury (HCC)   Nausea and vomiting   Chest pain   #1. Hypotension. Likely related to Cardizem she received in the emergency department for A. fib with RVR. Improving after 3 L of normal saline but blood pressure remains on the soft side -Admit to telemetry -Continue IV fluids -Hold beta blocker until tomorrow and then administer with parameters -Monitor closely  #2. Chest pain. Developed left anterior chest "pressure" during episodes of persistent nausea vomiting. Resolved on admission. Likely related to vigorous vomiting. EKG without acute changes. Echo done in 2013 with an EF of 60-65%. -IV fluids -Cycle troponins -Serial EKG -Chart review indicates outpatient stress test recommended but it doesn't appear that she ever had this done. -Outpatient follow-up with her cardiologist Dr. all red  #3. A. fib with RVR. Likely related to prolonged episode of nausea vomiting diarrhea. She is on a daily low-dose beta blocker and is compliant. chadvasc score 1. Emergency room physician spoke with cardiology who recommended a request 5 mg twice a day for 30 days. She is back in sinus rhythm at time of admission -We'll resume low-dose beta blocker tomorrow with parameters -prn diltiazem -Start eliquis -Outpatient  follow-up with her cardiologist  #4. Acute kidney injury. No history of chronic kidney disease. Mild. Likely related to recent nausea vomiting diarrhea. Creatinine 1.10. -IV fluids -Hold nephrotoxins -Monitor urine output -Recheck in the morning  5. Nausea vomiting diarrhea. No episodes of either since presentation to the emergency department. No recent antibiotic use. -Supportive therapy -zofran prn   DVT prophylaxis: scd Code Status: full  Family Communication: husband at bedside  Disposition Plan: home hopefully in 24 hours  Consults called: none  Admission status: obs    Radene Gunning MD [facility]  If 7PM-7AM, please contact night-coverage [email or web address] Password [email or web address]  12/18/2015, 3:07 PM

## 2015-12-18 NOTE — ED Notes (Signed)
3rd bolus hung

## 2015-12-18 NOTE — ED Triage Notes (Signed)
Pt states she woke up at 1am feeling nauseated and feeling like her heart was out of rhythm. Pt has hx Afib. Took second dose of metoprolol and called her MD. Was told to come in. Pt having some chest pressure.

## 2015-12-18 NOTE — ED Provider Notes (Addendum)
[facility] DEPT Provider Note   CSN: [id number] Arrival date & time: 12/18/15  0857     History   Chief Complaint Chief Complaint  Patient presents with  . Atrial Fibrillation    HPI Jenna Cortez is a 50 y.o. female.  HPI 50 year old female with a history of paroxysmal A. fib well controlled on metoprolol and not on any anticoagulation. She presents to the ED with tachycardia noted at 4:30 this morning. She noted that she wasn't likely A. fib and took double dose of her metoprolol however this did not improve the symptoms. She reports that she has been having some vomiting and diarrhea since last night after eating out. Endorses some mild abdominal discomfort. Denies any fevers, chills, cough. She does endorse some mild shortness of breath. Denies any other physical complaints.  Past Medical History:  Diagnosis Date  . Anxiety   . Atrial fibrillation ([place])   . Atrial tachycardia ([place])   . BRCA1 negative    NEGATIVE MUTATION 01/05/06  . BRCA2 negative    NEGATIVE MUTATION 01/05/06  . Depression   . Dysplasia of cervix, unspecified    HISTORY OF CERVICAL DYSPLASIA  . Herpes    HERPES SIMPLEX VIRUS  . History of cholecystectomy   . Hyperprolactinemia ([place])   . Insomnia   . TMJ (dislocation of temporomandibular joint)     Patient Active Problem List   Diagnosis Date Noted  . Hypotension 12/18/2015  . Acute kidney injury ([place]) 12/18/2015  . Depression 08/08/2015  . Acute maxillary sinusitis 02/14/2015  . Atrial tachycardia ([place]) 08/26/2013  . Palpitations 08/26/2013  . Acute bronchitis 06/15/2013  . Atrial fibrillation with RVR ([place])   . DEPRESSION 11/15/2008  . ALLERGIC RHINITIS 11/15/2008  . FATIGUE 11/15/2008  . HEADACHE 11/15/2008  . 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 COLONIC POLYPS, HX OF 11/15/2008    Past Surgical History:  Procedure Laterality Date  . AUGMENTATION MAMMAPLASTY     AUGMENTATION 1998  . BREAST SURGERY     RIGHT BREAST BIOPSY  2001-PAPILOMA/LT BR BXY-BENIGN 2003  . CHEST SURGERY     RIGHT CLAVICLE FRACTURE-TRAUMATIC  . CRYOTHERAPY     CERVIX  . HYSTEROSCOPY N/A 07/27/2013   Procedure: HYSTEROSCOPY WITH HYDROTHERMAL ABLATION;  Surgeon: Terrance Mass, MD;  Location: [facility];  Service: Gynecology;  Laterality: N/A;  . LAPAROSCOPIC CHOLECYSTECTOMY  2011  . SHOULDER SURGERY     RIGHT ROTATOR CUFF  2000  . SHOULDER SURGERY Right     OB History    Gravida Para Term Preterm AB Living   _0 SAB TAB Ectopic Multiple Live Births           2       Home Medications    Prior to Admission medications   Medication Sig Start Date End Date Taking? Authorizing Provider  aspirin EC 81 MG tablet Take 81 mg by mouth daily.   Yes Historical Provider, MD  fluticasone (FLONASE) 50 MCG/ACT nasal spray Place 2 sprays into the nose as needed for allergies. Reported on 02/14/2015 07/03/14 12/18/15 Yes Historical Provider, MD  metoprolol succinate (TOPROL-XL) 25 MG 24 hr tablet Take 0.5 tablets (12.5 mg total) by mouth daily. 10/18/14  Yes Thompson Grayer, MD  PARoxetine (PAXIL-CR) 12.5 MG 24 hr tablet Take 1 tablet (12.5 mg total) by mouth every morning. 09/19/15  Yes Terrance Mass, MD    Family History Family History  Problem Relation Age of  Onset  . Breast cancer Mother   . Hypertension Father   . Atrial fibrillation Brother     Social History Social History  Substance Use Topics  . Smoking status: Never Smoker  . Smokeless tobacco: Never Used  . Alcohol use 0.0 oz/week     Comment: OCCASIONALLY     Allergies   Codeine and Sulfonamide derivatives   Review of Systems Review of Systems Ten systems are reviewed and are negative for acute change except as noted in the HPI   Physical Exam Updated Vital Signs BP 112/58 (BP Location: Right Arm)   Pulse (!) 134   Temp 97.5 F (36.4 C) (Oral)   Resp 16   SpO2 98%   Physical Exam  Constitutional: She is oriented to person, place, and time. She appears  well-developed and well-nourished. No distress.  HENT:  Head: Normocephalic and atraumatic.  Nose: Nose normal.  Eyes: Conjunctivae and EOM are normal. Pupils are equal, round, and reactive to light. Right eye exhibits no discharge. Left eye exhibits no discharge. No scleral icterus.  Neck: Normal range of motion. Neck supple.  Cardiovascular: An irregularly irregular rhythm present. Tachycardia present.  Exam reveals no gallop and no friction rub.   No murmur heard. Pulmonary/Chest: Effort normal and breath sounds normal. No stridor. No respiratory distress. She has no rales.  Abdominal: Soft. She exhibits no distension. There is no tenderness.  Musculoskeletal: She exhibits no edema or tenderness.  Neurological: She is alert and oriented to person, place, and time.  Skin: Skin is warm and dry. No rash noted. She is not diaphoretic. No erythema.  Psychiatric: She has a normal mood and affect.  Vitals reviewed.    ED Treatments / Results  Labs (all labs ordered are listed, but only abnormal results are displayed) Labs Reviewed  BASIC METABOLIC PANEL - Abnormal; Notable for the following:       Result Value   Glucose, Bld 112 (*)    Creatinine, Ser 1.10 (*)    GFR calc non Af Amer 58 (*)    All other components within normal limits  CBC - Abnormal; Notable for the following:    Hemoglobin 15.3 (*)    All other components within normal limits  TROPONIN I  TROPONIN I  TROPONIN I  TSH  URINALYSIS, ROUTINE W REFLEX MICROSCOPIC (NOT AT [facility])    EKG  EKG Interpretation  Date/Time:  Thursday December 18 2015 09:02:24 EST Ventricular Rate:  138 PR Interval:    QRS Duration: 72 QT Interval:  298 QTC Calculation: 451 R Axis:   86 Text Interpretation:  Atrial fibrillation with rapid ventricular response Septal infarct , age undetermined Abnormal ECG Confirmed by [facility] MD, Kerissa Coia [phone]) on 12/18/2015 5:52:37 PM       Radiology Dg Chest 2 View  Result Date:  12/18/2015 CLINICAL DATA:  Atrial fibrillation.  Nausea.  Chest pressure EXAM: CHEST  2 VIEW COMPARISON:  July 30, 2014 FINDINGS: There is no edema or consolidation. There is mild stable scarring in the left upper lobe. The heart size and pulmonary vascularity are normal. No adenopathy. There is mid thoracic levoscoliosis, mild. IMPRESSION: Scarring left upper lobe. No edema or consolidation. Stable cardiac silhouette. Electronically Signed   By: Lowella Grip III M.D.   On: 12/18/2015 09:48    Procedures Procedures (including critical care time) CRITICAL CARE Performed by: Grayce Sessions Anvay Tennis Total critical care time: 40 minutes Critical care time was exclusive of separately billable procedures and treating other  patients. Critical care was necessary to treat or prevent imminent or life-threatening deterioration. Critical care was time spent personally by me on the following activities: development of treatment plan with patient and/or surrogate as well as nursing, discussions with consultants, evaluation of patient's response to treatment, examination of patient, obtaining history from patient or surrogate, ordering and performing treatments and interventions, ordering and review of laboratory studies, ordering and review of radiographic studies, pulse oximetry and re-evaluation of patient's condition.   Medications Ordered in ED Medications  PARoxetine (PAXIL-CR) 24 hr tablet 12.5 mg (not administered)  fluticasone (FLONASE) 50 MCG/ACT nasal spray 2 spray (not administered)  aspirin EC tablet 81 mg (not administered)  enoxaparin (LOVENOX) injection 40 mg (not administered)  sodium chloride flush (NS) 0.9 % injection 3 mL (not administered)  0.9 %  sodium chloride infusion (not administered)  acetaminophen (TYLENOL) tablet 650 mg (not administered)    Or  acetaminophen (TYLENOL) suppository 650 mg (not administered)  ondansetron (ZOFRAN) tablet 4 mg (not administered)    Or   ondansetron (ZOFRAN) injection 4 mg (not administered)  metoprolol tartrate (LOPRESSOR) tablet 12.5 mg (not administered)  sodium chloride 0.9 % bolus 1,000 mL (0 mLs Intravenous Stopped 12/18/15 1127)  diltiazem (CARDIZEM) injection 15 mg (15 mg Intravenous Given 12/18/15 0959)  0.9 %  sodium chloride infusion ( Intravenous New Bag/Given 12/18/15 1346)     Initial Impression / Assessment and Plan / ED Course  I have reviewed the triage vital signs and the nursing notes.  Pertinent labs & imaging results that were available during my care of the patient were reviewed by me and considered in my medical decision making (see chart for details).  Clinical Course     A. fib RVR. Patient given 15 mg bolus of diltiazem and which had improvement in her rate however did not convert her to a normal sinus rhythm. Discussed case with cardiology and felt that the patient was appropriate for cardioversion. Just prior to cardioversion patient converted by herself following IV fluid bolus. Cardiology did recommend starting the patient on Eliquis 5 mg twice a day for 30 days. She is to follow-up with Butch Penny at the [facility] on Monday.   EKG Interpretation  Date/Time:  Thursday December 18 2015 12:00:39 EST Ventricular Rate:  80 PR Interval:    QRS Duration: 77 QT Interval:  368 QTC Calculation: 425 R Axis:   90 Text Interpretation:  Sinus rhythm Borderline right axis deviation Low voltage, precordial leads Probable anteroseptal infarct, old resolved afib Confirmed by [place] ([id number]) on 12/18/2015 5:53:57 PM       However patient was noted to be hypotensive with systolics in the low 24M and 80s. This improved with IV fluids however her pressures trending down following completion of fluid boluses. In total patient received 3 L of IV fluids. Labs revealed a worsening in her renal function consistent with likely dehydration. Given her persistent soft blood pressures, felt the patient  required admission for continued hydration.  Appreciate hospitalist admission for continued workup.  Final Clinical Impressions(s) / ED Diagnoses   Final diagnoses:  Atrial fibrillation with RVR ([place])  Dehydration    Disposition: Admit  Condition: fair      Fatima Blank, MD 12/18/15 1754

## 2015-12-18 NOTE — ED Notes (Signed)
Pt. Still in A-fib and HR in the 130's and 140's

## 2015-12-18 NOTE — Telephone Encounter (Signed)
Pt's husband called she began with N & V last pm took half metoprolol and she was in a fib, first episode in some time, also tried another whole metoprolol but still with nausea and vomiting and a fib.  I asked them to come to ER for management as pt states she feels she will pass out.  Her husband agreed.

## 2015-12-19 DIAGNOSIS — N179 Acute kidney failure, unspecified: Secondary | ICD-10-CM | POA: Diagnosis not present

## 2015-12-19 DIAGNOSIS — R112 Nausea with vomiting, unspecified: Secondary | ICD-10-CM | POA: Diagnosis not present

## 2015-12-19 DIAGNOSIS — I4891 Unspecified atrial fibrillation: Secondary | ICD-10-CM

## 2015-12-19 DIAGNOSIS — I959 Hypotension, unspecified: Secondary | ICD-10-CM | POA: Diagnosis not present

## 2015-12-19 DIAGNOSIS — D649 Anemia, unspecified: Secondary | ICD-10-CM | POA: Diagnosis not present

## 2015-12-19 DIAGNOSIS — A084 Viral intestinal infection, unspecified: Secondary | ICD-10-CM | POA: Diagnosis not present

## 2015-12-19 DIAGNOSIS — F419 Anxiety disorder, unspecified: Secondary | ICD-10-CM

## 2015-12-19 DIAGNOSIS — I10 Essential (primary) hypertension: Secondary | ICD-10-CM

## 2015-12-19 DIAGNOSIS — E860 Dehydration: Secondary | ICD-10-CM | POA: Diagnosis not present

## 2015-12-19 DIAGNOSIS — I480 Paroxysmal atrial fibrillation: Secondary | ICD-10-CM | POA: Diagnosis not present

## 2015-12-19 LAB — BASIC METABOLIC PANEL
Anion gap: 4 — ABNORMAL LOW (ref 5–15)
BUN: 9 mg/dL (ref 6–20)
CO2: 25 mmol/L (ref 22–32)
Calcium: 8.4 mg/dL — ABNORMAL LOW (ref 8.9–10.3)
Chloride: 108 mmol/L (ref 101–111)
Creatinine, Ser: 0.88 mg/dL (ref 0.44–1.00)
GFR calc Af Amer: >60 mL/min (ref >60)
GFR calc non Af Amer: >60 mL/min (ref >60)
Glucose, Bld: 106 mg/dL — ABNORMAL HIGH (ref 65–99)
Potassium: 3.7 mmol/L (ref 3.5–5.1)
Sodium: 137 mmol/L (ref 135–145)

## 2015-12-19 LAB — CBC
HCT: 31.8 % — ABNORMAL LOW (ref 36.0–46.0)
HCT: 33.3 % — ABNORMAL LOW (ref 36.0–46.0)
Hemoglobin: 10.6 g/dL — ABNORMAL LOW (ref 12.0–15.0)
Hemoglobin: 10.9 g/dL — ABNORMAL LOW (ref 12.0–15.0)
MCH: 29.7 pg (ref 26.0–34.0)
MCH: 29.5 pg (ref 26.0–34.0)
MCHC: 33.3 g/dL (ref 30.0–36.0)
MCHC: 32.7 g/dL (ref 30.0–36.0)
MCV: 89.1 fL (ref 78.0–100.0)
MCV: 90 fL (ref 78.0–100.0)
Platelets: 147 10*3/uL — ABNORMAL LOW (ref 150–400)
Platelets: 150 10*3/uL (ref 150–400)
RBC: 3.57 MIL/uL — ABNORMAL LOW (ref 3.87–5.11)
RBC: 3.7 MIL/uL — ABNORMAL LOW (ref 3.87–5.11)
RDW: 12.4 % (ref 11.5–15.5)
RDW: 12.5 % (ref 11.5–15.5)
WBC: 3.9 10*3/uL — ABNORMAL LOW (ref 4.0–10.5)
WBC: 3.4 10*3/uL — ABNORMAL LOW (ref 4.0–10.5)

## 2015-12-19 LAB — URINALYSIS, ROUTINE W REFLEX MICROSCOPIC
Bilirubin Urine: NEGATIVE
Glucose, UA: NEGATIVE mg/dL
Ketones, ur: NEGATIVE mg/dL
Nitrite: NEGATIVE
Protein, ur: NEGATIVE mg/dL
Specific Gravity, Urine: 1.01 (ref 1.005–1.030)
pH: 5 (ref 5.0–8.0)

## 2015-12-19 LAB — HEMOGLOBIN AND HEMATOCRIT, BLOOD
HCT: 33.2 % — ABNORMAL LOW (ref 36.0–46.0)
Hemoglobin: 11 g/dL — ABNORMAL LOW (ref 12.0–15.0)

## 2015-12-19 LAB — URINE MICROSCOPIC-ADD ON

## 2015-12-19 LAB — OCCULT BLOOD X 1 CARD TO LAB, STOOL: Fecal Occult Bld: NEGATIVE

## 2015-12-19 LAB — TROPONIN I: Troponin I: <0.03 ng/mL (ref <0.03)

## 2015-12-19 NOTE — Progress Notes (Signed)
CCMD called and stated that pt had a 3 second run of SVT, then converted back to NSR w/ 1st deg AVB. Went to check on pt who stated that she had felt it and that her heart felt like it had prior to admission, except that "it went back in this time." Will continue to assess and monitor pt.

## 2015-12-19 NOTE — Discharge Summary (Signed)
Physician Discharge Summary  Jenna CornsMelissa T Thomann  UJW:[id number]  DOB: 03/17/1965  DOA: 12/18/2015 PCP: Neena RhymesKatherine Tabori, MD  Admit date: 12/18/2015 Discharge date: 12/19/2015  Admitted From: Home  Disposition:  Home   Recommendations for Outpatient Follow-up:  1. Follow up with PCP in 1-2 weeks 2. Please obtain BMP/CBC in one week 3. Please check anemia panel    Home Health: None  Equipment/Devices: None   Discharge Condition: Stable  CODE STATUS: Full  Diet recommendation: Heart Healthy / Carb Modified   Brief/Interim Summary: Jenna Cortez is 50 y.o female with medical history of Afib not on A/C, depression and anxiety presented to the ED with chief complain of palpitations. Patient was found to be on Afib with RVR in the ED she was given cardizem in the ED which put patient back to normal sinus rhythm. Patient was placed on observation for hydration and monitoring BP which was initially low.   Patient reports having symptoms of gastroenteritis last night with 3-4 episodes of emesis and 2-3 episode of diarrhea, subsequently she develop chest discomfort, SOB and palpitations. She took and extra dose of metoprolol but did not help.  Patient was deemed to have Afib secondary to dehydration due to gastroenteritis, Afib was controlled with Cardizem OTO, coming back to to NSR. Cardiology was consulted, which recommended to continue current home treatment. BP remained soft but stable. Emesis and diarrhea resolved and patient tolerated diet well.  On labs Hb was found to be 10.8, initial 15.3 on ED repeated hb was 11. FOB was negative and patient did not have any gross bleeding.   Patient have significantly improved and will be discharge home on her current medical management with metoprolol and ASA. Patient was advised to follow up with her PMD in 1 wek to repeat CBC and with Afib clinic.   Subjective: Patient seen and examined in the AM she has no complaint at this time. Denies chest  pain, SOB, palpitations and dizziness. Patient afebrile and tolerating diet.   Discharge Diagnoses:  Afib with RVR - 2/2 to dehydration due to viral gastroenteritis - back to normal sinus rhythm  CHADSVASC 1 - a/c with ASA alone  Continue metoprolol  Follow up with Afib clinic in 1 week   AKI 2/2 to dehydration - Resolved with IVF   Anemia - unknown etiology likely hemodilution, FOB negative, no gross bleed, MCV normal  Repeat CBC in 1 week with PMD Anemia work up as outpatient   Viral Gastroenteritis - resolved    Discharge Instructions  Discharge Instructions    Call MD for:  difficulty breathing, headache or visual disturbances    Complete by:  As directed    Call MD for:  extreme fatigue    Complete by:  As directed    Call MD for:  hives    Complete by:  As directed    Call MD for:  persistant dizziness or light-headedness    Complete by:  As directed    Call MD for:  persistant nausea and vomiting    Complete by:  As directed    Call MD for:  redness, tenderness, or signs of infection (pain, swelling, redness, odor or green/yellow discharge around incision site)    Complete by:  As directed    Call MD for:  severe uncontrolled pain    Complete by:  As directed    Call MD for:  temperature >100.4    Complete by:  As directed    Diet -  low sodium heart healthy    Complete by:  As directed    Discharge instructions    Complete by:  As directed    Increase activity slowly    Complete by:  As directed        Medication List    TAKE these medications   aspirin EC 81 MG tablet Take 81 mg by mouth daily.   fluticasone 50 MCG/ACT nasal spray Commonly known as:  FLONASE Place 2 sprays into the nose as needed for allergies. Reported on 02/14/2015   metoprolol succinate 25 MG 24 hr tablet Commonly known as:  TOPROL-XL Take 0.5 tablets (12.5 mg total) by mouth daily.   PARoxetine 12.5 MG 24 hr tablet Commonly known as:  PAXIL-CR Take 1 tablet (12.5 mg total) by  mouth every morning.      Follow-up Information    [facility]. Go on 12/23/2015.   Specialty:  Cardiology Why:  @11 :30 am for post hospital Parking code 0004 Contact information: [address] [id number] [place] [id number] [phone]       Neena RhymesKatherine Tabori, MD. Schedule an appointment as soon as possible for a visit in 1 week(s).   Specialty:  Family Medicine Contact information: [address] [place] [id number] [phone]          Allergies  Allergen Reactions  . Codeine Itching  . Sulfonamide Derivatives Rash    Consultations:  Cardiology    Procedures/Studies: Dg Chest 2 View  Result Date: 12/18/2015 CLINICAL DATA:  Atrial fibrillation.  Nausea.  Chest pressure EXAM: CHEST  2 VIEW COMPARISON:  July 30, 2014 FINDINGS: There is no edema or consolidation. There is mild stable scarring in the left upper lobe. The heart size and pulmonary vascularity are normal. No adenopathy. There is mid thoracic levoscoliosis, mild. IMPRESSION: Scarring left upper lobe. No edema or consolidation. Stable cardiac silhouette. Electronically Signed   By: Bretta BangWilliam  Woodruff III M.D.   On: 12/18/2015 09:48    Discharge Exam: Vitals:   12/19/15 0420 12/19/15 0845  BP: (!) 100/55 (!) 101/54  Pulse: 82 84  Resp: 18 18  Temp: 98.3 F (36.8 C) 98.4 F (36.9 C)   Vitals:   12/19/15 0010 12/19/15 0254 12/19/15 0420 12/19/15 0845  BP: (!) 100/51 (!) 97/53 (!) 100/55 (!) 101/54  Pulse: 80 79 82 84  Resp: 18  18 18   Temp: 98.2 F (36.8 C)  98.3 F (36.8 C) 98.4 F (36.9 C)  TempSrc: Oral  Oral Oral  SpO2: 96% 97% 96% 97%  Weight:   69.8 kg (153 lb 12.8 oz)   Height:        General: Pt is alert, awake, not in acute distress Cardiovascular: RRR, S1/S2 +, no rubs, no gallops Respiratory: CTA bilaterally, no wheezing, no rhonchi Abdominal: Soft, NT, ND, bowel sounds + Extremities: no edema, no cyanosis    The  results of significant diagnostics from this hospitalization (including imaging, microbiology, ancillary and laboratory) are listed below for reference.     Microbiology: No results found for this or any previous visit (from the past 240 hour(s)).   Labs: BNP (last 3 results) No results for input(s): BNP in the last 8760 hours. Basic Metabolic Panel:  Recent Labs Lab 12/18/15 0915 12/19/15 0155  NA 137 137  K 4.5 3.7  CL 106 108  CO2 24 25  GLUCOSE 112* 106*  BUN 19 9  CREATININE 1.10* 0.88  CALCIUM 9.9 8.4*  Liver Function Tests: No results for input(s): AST, ALT, ALKPHOS, BILITOT, PROT, ALBUMIN in the last 168 hours. No results for input(s): LIPASE, AMYLASE in the last 168 hours. No results for input(s): AMMONIA in the last 168 hours. CBC:  Recent Labs Lab 12/18/15 0915 12/19/15 0155 12/19/15 1136 12/19/15 1555  WBC 9.5 3.9* 3.4*  --   HGB 15.3* 10.6* 10.9* 11.0*  HCT 44.3 31.8* 33.3* 33.2*  MCV 87.7 89.1 90.0  --   PLT 246 147* 150  --    Cardiac Enzymes:  Recent Labs Lab 12/18/15 1637 12/18/15 2025 12/19/15 0154  TROPONINI 0.06* 0.04* <0.03   BNP: Invalid input(s): POCBNP CBG: No results for input(s): GLUCAP in the last 168 hours. D-Dimer No results for input(s): DDIMER in the last 72 hours. Hgb A1c No results for input(s): HGBA1C in the last 72 hours. Lipid Profile No results for input(s): CHOL, HDL, LDLCALC, TRIG, CHOLHDL, LDLDIRECT in the last 72 hours. Thyroid function studies  Recent Labs  12/18/15 1637  TSH 1.304   Anemia work up No results for input(s): VITAMINB12, FOLATE, FERRITIN, TIBC, IRON, RETICCTPCT in the last 72 hours. Urinalysis    Component Value Date/Time   COLORURINE YELLOW 12/19/2015 0205   APPEARANCEUR HAZY (A) 12/19/2015 0205   LABSPEC 1.010 12/19/2015 0205   PHURINE 5.0 12/19/2015 0205   GLUCOSEU NEGATIVE 12/19/2015 0205   HGBUR SMALL (A) 12/19/2015 0205   BILIRUBINUR NEGATIVE 12/19/2015 0205   KETONESUR  NEGATIVE 12/19/2015 0205   PROTEINUR NEGATIVE 12/19/2015 0205   UROBILINOGEN 0.2 07/30/2014 0900   NITRITE NEGATIVE 12/19/2015 0205   LEUKOCYTESUR LARGE (A) 12/19/2015 0205   Sepsis Labs Invalid input(s): PROCALCITONIN,  WBC,  LACTICIDVEN Microbiology No results found for this or any previous visit (from the past 240 hour(s)).   Time coordinating discharge: Over 30 minutes  SIGNED:  Latrelle Dodrill, MD  [facility] 12/19/2015, 6:20 PM Pager   If 7PM-7AM, please contact night-coverage [email or web address] Password [email or web address]

## 2015-12-19 NOTE — Consult Note (Signed)
CARDIOLOGY CONSULT NOTE   Patient ID: Jenna Cortez MRN: [id number] DOB/AGE: 48-Jan-1967 48 y.o.  Admit date: 12/18/2015  Primary Physician   Annye Asa, MD Primary Cardiologist   Dr. Rayann Heman (previous patient of Dr. Verl Blalock) Reason for Consultation   AFib Requesting Physician  Dr. Patrecia Pour  HPI: Jenna Cortez is a 50 y.o. female with a history of PAF (not on anticoagulation due to CHADSCASCs score of 1) and anxiety who presented for palpitation, nausea, vomiting and diarrhea.   She was admitted in 03/2011 with AFib with RVR.  She converted with Flecainide 200 mg.  She was given a Rx for prn Flecainide, currently on metoprolol. She wore an event monitor 6/15 which documented frequent episodes of nonsustained atach/ afib as the cause for her palpitations. Last seen by Dr. Rayann Heman 10/2014. She was doing well at that time. She does not wish to consider AAD therapy or ablation.   Her brother has AFib.  He has undergone an ablation in the past.    She was in [place] up-until 1 am of 12/18/15 when she had few episode of non bilious, non bloody vomiting with diarrhea. About 4am she noted heart palpitations and chest pain. She took metoprolol '25mg'$  (usaully takes 12.'5mg'$  qd) without improvement. Later she had dyspnea and dizziness leading to ER presentation for further evaluation.   EKG noted afib RVR at rate of 138 bpm. Given Iv cardizem '15mg'$  with improvement of rate and later conversion to sinus rhythm. Noted hypotensive. She had two brief episode of afib overnight. She did felt 2nd episode. This lasted for few seconds. She was started on Eliquis '5mg'$  BID for 1 months after on call discussion with cardiologist. HGb 15.3-->10.6. Denies any bleeding. CXR stable.    She states that last episode of afib occurred summer 2016.  Past Medical History:  Diagnosis Date  . Anxiety   . Atrial fibrillation ([place])   . Atrial tachycardia ([place])   . BRCA1 negative    NEGATIVE MUTATION 01/05/06  . BRCA2  negative    NEGATIVE MUTATION 01/05/06  . Depression   . Dysplasia of cervix, unspecified    HISTORY OF CERVICAL DYSPLASIA  . Herpes    HERPES SIMPLEX VIRUS  . Hyperprolactinemia ([place])   . Insomnia   . Migraine    "2-3/year" (12/18/2015)  . TMJ (dislocation of temporomandibular joint)      Past Surgical History:  Procedure Laterality Date  . AUGMENTATION MAMMAPLASTY Bilateral 1998  . BREAST BIOPSY Bilateral 2001-2003   RIGHT BREAST BIOPSY 2001-PAPILOMA/LT BR BXY-BENIGN 2003  . CERVIX LESION DESTRUCTION  ~ 1993  . CLAVICLE HARDWARE REMOVAL Right ~ 2008  . EYE SURGERY    . FRACTURE SURGERY    . HYSTEROSCOPY N/A 07/27/2013   Procedure: HYSTEROSCOPY WITH HYDROTHERMAL ABLATION;  Surgeon: Terrance Mass, MD;  Location: [facility];  Service: Gynecology;  Laterality: N/A;  . LAPAROSCOPIC CHOLECYSTECTOMY  2011  . ORIF CLAVICLE FRACTURE Right ~ 2008   CLAVICLE FRACTURE-TRAUMATIC  . REFRACTIVE SURGERY Bilateral 1990s  . SHOULDER ARTHROSCOPY Right 2016   "labrium cartilage repair"  . SHOULDER ARTHROSCOPY WITH ROTATOR CUFF REPAIR Right 2000    Allergies  Allergen Reactions  . Codeine Itching  . Sulfonamide Derivatives Rash    I have reviewed the patient's current medications . apixaban  5 mg Oral BID  . aspirin EC  81 mg Oral Daily  . PARoxetine  12.5 mg Oral [id number]  . sodium chloride flush  3 mL Intravenous Q12H   .  sodium chloride 75 mL/hr at 12/18/15 2250   acetaminophen **OR** acetaminophen, diltiazem, fluticasone, loperamide, ondansetron **OR** ondansetron (ZOFRAN) IV  Prior to Admission medications   Medication Sig Start Date End Date Taking? Authorizing Provider  aspirin EC 81 MG tablet Take 81 mg by mouth daily.   Yes Historical Provider, MD  fluticasone (FLONASE) 50 MCG/ACT nasal spray Place 2 sprays into the nose as needed for allergies. Reported on 02/14/2015 07/03/14 12/18/15 Yes Historical Provider, MD  metoprolol succinate (TOPROL-XL) 25 MG 24 hr tablet Take 0.5  tablets (12.5 mg total) by mouth daily. 10/18/14  Yes Thompson Grayer, MD  PARoxetine (PAXIL-CR) 12.5 MG 24 hr tablet Take 1 tablet (12.5 mg total) by mouth every morning. 09/19/15  Yes Terrance Mass, MD     Social History   Social History  . Marital status: Married    Spouse name: N/A  . Number of children: N/A  . Years of education: N/A   Occupational History  . Not on file.   Social History Main Topics  . Smoking status: Never Smoker  . Smokeless tobacco: Never Used  . Alcohol use 0.0 oz/week     Comment: 12/18/2015 "might have a glass of wine 2-3 times/month"  . Drug use: No  . Sexual activity: Yes    Birth control/ protection: Condom   Other Topics Concern  . Not on file   Social History Narrative  . No narrative on file    Family Status  Relation Status  . Mother   . Father   . Brother    Family History  Problem Relation Age of Onset  . Breast cancer Mother   . Hypertension Father   . Atrial fibrillation Brother       ROS:  Full 14 point review of systems complete and found to be negative unless listed above.  Physical Exam: Blood pressure (!) 100/55, pulse 82, temperature 98.3 F (36.8 C), temperature source Oral, resp. rate 18, height _0  (1.6 m), weight 153 lb 12.8 oz (69.8 kg), SpO2 96 %.  General: Well developed, well nourished, female in no acute distress Head: Eyes PERRLA, No xanthomas. Normocephalic and atraumatic, oropharynx without edema or exudate.  Lungs: Resp regular and unlabored, CTA. Heart: RRR no s3, s4, or murmurs..   Neck: No carotid bruits. No lymphadenopathy.  No JVD. Abdomen: Bowel sounds present, abdomen soft and non-tender without masses or hernias noted. Msk:  No spine or cva tenderness. No weakness, no joint deformities or effusions. Extremities: No clubbing, cyanosis or edema. DP/PT/Radials 2+ and equal bilaterally. Neuro: Alert and oriented X 3. No focal deficits noted. Psych:  Good affect, responds appropriately Skin: No  rashes or lesions noted.  Labs:   Lab Results  Component Value Date   WBC 3.9 (L) 12/19/2015   HGB 10.6 (L) 12/19/2015   HCT 31.8 (L) 12/19/2015   MCV 89.1 12/19/2015   PLT 147 (L) 12/19/2015   No results for input(s): INR in the last 72 hours.  Recent Labs Lab 12/19/15 0155  NA 137  K 3.7  CL 108  CO2 25  BUN 9  CREATININE 0.88  CALCIUM 8.4*  GLUCOSE 106*   No results found for: MG  Recent Labs  12/18/15 1637 12/18/15 2025 12/19/15 0154  TROPONINI 0.06* 0.04* <0.03   No results for input(s): TROPIPOC in the last 72 hours. No results found for: PROBNP Lab Results  Component Value Date   CHOL 161 08/22/2015   HDL 77 08/22/2015  [place] 69 08/22/2015   TRIG 74 08/22/2015   No results found for: DDIMER No results found for: LIPASE, AMYLASE TSH  Date/Time Value Ref Range Status  12/18/2015 04:37 PM 1.304 0.350 - 4.500 uIU/mL Final    Comment:    Performed by a 3rd Generation assay with a functional sensitivity of <=0.01 uIU/mL.  08/22/2015 08:41 AM 4.04 mIU/L Final    Comment:      Reference Range   > or = 20 Years  0.40-4.50   Pregnancy Range First trimester  0.26-2.66 Second trimester 0.55-2.73 Third trimester  0.43-2.91      No results found for: VITAMINB12, FOLATE, FERRITIN, TIBC, IRON, RETICCTPCT  Echo: 03/2011 LV EF: 60% -  65%  ------------------------------------------------------------ Indications:   Atrial fibrillation - 427.31.  ------------------------------------------------------------ History:  PMH: No prior cardiac history.  ------------------------------------------------------------ Study Conclusions  - Left ventricle: The cavity size was normal. Wall thickness was normal. Systolic function was normal. The estimated ejection fraction was in the range of 60% to 65%. Wall motion was normal; there were no regional wall motion abnormalities. Left ventricular diastolic function parameters were normal. -  Aortic valve: There was no stenosis. - Mitral valve: No significant regurgitation. - Left atrium: The atrium was at the upper limits of normal in size. - Right ventricle: The cavity size was normal. Systolic function was normal. - Pulmonary arteries: No complete TR doppler jet so unable to estimate PA systolic pressure. - Systemic veins: IVC measured 2.1 cm with some respirophasic variation, suggesting RA pressure 10 mmHg. Impressions:  - Normal LV size and systolic function, EF 36-64%. Normal diastolic function. No significant valvular dysfunction. Normal RV size and systolic function. Upper normal left atrial size.    Radiology:  Dg Chest 2 View  Result Date: 12/18/2015 CLINICAL DATA:  Atrial fibrillation.  Nausea.  Chest pressure EXAM: CHEST  2 VIEW COMPARISON:  July 30, 2014 FINDINGS: There is no edema or consolidation. There is mild stable scarring in the left upper lobe. The heart size and pulmonary vascularity are normal. No adenopathy. There is mid thoracic levoscoliosis, mild. IMPRESSION: Scarring left upper lobe. No edema or consolidation. Stable cardiac silhouette. Electronically Signed   By: Lowella Grip III M.D.   On: 12/18/2015 09:48    ASSESSMENT AND PLAN:     1. Afib RVR - Likely related to possible food poising (prolonged vomiting and diarrhea0. She converted to sinus rhythm after IV cardizem '15mg'$  x 1. Two brief episode after wards. She was started on Eliquis '5mg'$  BID yesterday by the on all team. HGb 15.3-->10.6. She denies any bleeding. Will repeat CBC.  - Resume BB at home dose when BP stable. F/u in Afib clinic next week.   2. Hypotension - likely related to dehydration and IV Cardizem. Consider bolus of fluid.   3. Elevated troponin - Troponin of 0.06-->0.04--> < 0.03. Likely rate related, AKI and dehydration.   SignedLeanor Kail, PA 12/19/2015, 10:44 AM Pager [phone]  I have personally seen and examined this patient with  Jenna Kail, PA-C. I agree with the assessment and plan as outlined above. She has known PAF. She has a CHADS VASC score 1. She spontaneously converted to sinus.  My exam shows a well developed female in NAD. CV:RRR. Lungs: clear bilaterally. Abd: soft, NT. Ext: no edema.  Labs reviewed by me. Repeat CBC. EKG reviewed by me. Sinus with poor R wave progression precordial leads.  Plan: I would continue her beta blocker. AF likely exacerbated by acute viral  illness. NO indication for anti-coagulation given low risk of stroke. I would resume ASA. OK to d/c home today. Follow up next week in atrial fib clinic.   Lauree Chandler 12/19/2015 11:23 AM

## 2015-12-19 NOTE — Discharge Instructions (Signed)

## 2015-12-22 ENCOUNTER — Telehealth: Payer: Self-pay

## 2015-12-22 NOTE — Telephone Encounter (Addendum)
Transition Care Management Follow-up Telephone Call   Date discharged? 12/19/15   How have you been since you were released from the hospital? "alright"   Do you understand why you were in the hospital? yes   Do you understand the discharge instructions? yes   Where were you discharged to? Home   Items Reviewed:  Medications reviewed: yes  Allergies reviewed: yes  Dietary changes reviewed: yes  Referrals reviewed: yes, A-Fib clinic   Functional Questionnaire:   Activities of Daily Living (ADLs):   She states they are independent in the following: ambulation, bathing and hygiene, feeding, continence, grooming, toileting and dressing States they require assistance with the following: none   Any transportation issues/concerns?: no   Any patient concerns? no   Confirmed importance and date/time of follow-up visits scheduled yes  Provider Appointment booked with PCP 12/18 at 11am. Patient only off work on Fridays, unable to schedule in 2 week time frame. Patient has f/u appt with A-Fib clinic on Friday, 01/02/16.   Confirmed with patient if condition begins to worsen call PCP or go to the ER.  Patient was given the office number and encouraged to call back with question or concerns.  : yes

## 2015-12-22 NOTE — Telephone Encounter (Signed)
LM for patient to return call to complete TCM and schedule TCM/hosp f/u appointment.

## 2015-12-23 ENCOUNTER — Ambulatory Visit (HOSPITAL_COMMUNITY): Payer: BC Managed Care – PPO | Admitting: Nurse Practitioner

## 2015-12-30 ENCOUNTER — Encounter: Payer: Self-pay | Admitting: Physician Assistant

## 2015-12-30 ENCOUNTER — Ambulatory Visit (INDEPENDENT_AMBULATORY_CARE_PROVIDER_SITE_OTHER): Payer: BC Managed Care – PPO | Admitting: Physician Assistant

## 2015-12-30 VITALS — BP 110/68 | HR 76 | Temp 98.4°F | Resp 14 | Ht 64.0 in | Wt 144.0 lb

## 2015-12-30 DIAGNOSIS — B9789 Other viral agents as the cause of diseases classified elsewhere: Secondary | ICD-10-CM | POA: Diagnosis not present

## 2015-12-30 DIAGNOSIS — J329 Chronic sinusitis, unspecified: Secondary | ICD-10-CM | POA: Diagnosis not present

## 2015-12-30 MED ORDER — FLUTICASONE PROPIONATE 50 MCG/ACT NA SUSP: 2.0000 | Freq: Every day | NASAL | 0 refills | Status: DC | PRN

## 2015-12-30 NOTE — Patient Instructions (Signed)
We are sorry that you are not feeling well.  Here is how we plan to help!  Based on what you have shared with me and your examination it looks like you have sinusitis.  Sinusitis is inflammation and infection in the sinus cavities of the head.  Based on your presentation I believe you most likely have Acute Viral Sinusitis.This is an infection most likely caused by a virus. There is not specific treatment for viral sinusitis other than to help you with the symptoms until the infection runs its course.  You may use an oral decongestant such as Mucinex D or if you have glaucoma or high blood pressure use plain Mucinex. Saline nasal spray help and can safely be used as often as needed for congestion, I have prescribed: Fluticasone nasal spray two sprays in each nostril twice a day   Sinus infections are not as easily transmitted as other respiratory infection, however we still recommend that you avoid close contact with loved ones, especially the very young and elderly.  Remember to wash your hands thoroughly throughout the day as this is the number one way to prevent the spread of infection!  Home Care:  Only take medications as instructed by your medical team.  Complete the entire course of an antibiotic.  Do not take these medications with alcohol.  A steam or ultrasonic humidifier can help congestion.  You can place a towel over your head and breathe in the steam from hot water coming from a faucet.  Avoid close contacts especially the very young and the elderly.  Cover your mouth when you cough or sneeze.  Always remember to wash your hands.  Get Help Right Away If:  You develop worsening fever or sinus pain.  You develop a severe head ache or visual changes.  Your symptoms persist after you have completed your treatment plan.  Make sure you  Understand these instructions.  Will watch your condition.  Will get help right away if you are not doing well or get worse.

## 2015-12-30 NOTE — Progress Notes (Signed)
Patient presents to clinic today c/o 1 day of runny nose, sinus pressure, ear pressure, nasal congestion. Denies fever. Endorses some chills yesterday. Denies chest congestion, chest pain or SOB. Denies history of asthma or COPD. Is not a smoker. Endorses history of seasonal allergies but does not take anything on a regular basis. Denies recent travel or sick contact. Endorses taking some Mucinex last night and Flonase this morning. Has had flu shot this year.   Past Medical History:  Diagnosis Date  . Anxiety   . Atrial fibrillation ([place])   . Atrial tachycardia ([place])   . BRCA1 negative    NEGATIVE MUTATION 01/05/06  . BRCA2 negative    NEGATIVE MUTATION 01/05/06  . Depression   . Dysplasia of cervix, unspecified    HISTORY OF CERVICAL DYSPLASIA  . Herpes    HERPES SIMPLEX VIRUS  . Hyperprolactinemia ([place])   . Insomnia   . Migraine    "2-3/year" (12/18/2015)  . TMJ (dislocation of temporomandibular joint)     Current Outpatient Prescriptions on File Prior to Visit  Medication Sig Dispense Refill  . aspirin EC 81 MG tablet Take 81 mg by mouth daily.    . metoprolol succinate (TOPROL-XL) 25 MG 24 hr tablet Take 0.5 tablets (12.5 mg total) by mouth daily. 45 tablet 3  . PARoxetine (PAXIL-CR) 12.5 MG 24 hr tablet Take 1 tablet (12.5 mg total) by mouth every morning. 90 tablet 3  . fluticasone (FLONASE) 50 MCG/ACT nasal spray Place 2 sprays into the nose as needed for allergies. Reported on 02/14/2015     No current facility-administered medications on file prior to visit.     Allergies  Allergen Reactions  . Codeine Itching  . Sulfonamide Derivatives Rash    Family History  Problem Relation Age of Onset  . Breast cancer Mother   . Hypertension Father   . Atrial fibrillation Brother     Social History   Social History  . Marital status: Married    Spouse name: N/A  . Number of children: N/A  . Years of education: N/A   Social History Main Topics  . Smoking  status: Never Smoker  . Smokeless tobacco: Never Used  . Alcohol use 0.0 oz/week     Comment: 12/18/2015 "might have a glass of wine 2-3 times/month"  . Drug use: No  . Sexual activity: Yes    Birth control/ protection: Condom   Other Topics Concern  . None   Social History Narrative  . None    Review of Systems - See HPI.  All other ROS are negative.  BP 110/68   Pulse 76   Temp 98.4 F (36.9 C) (Oral)   Resp 14   Ht _0  (1.626 m)   Wt 144 lb (65.3 kg)   SpO2 98%   BMI 24.72 kg/m   Physical Exam  Constitutional: She is oriented to person, place, and time and well-developed, well-nourished, and in no distress.  HENT:  Head: Normocephalic and atraumatic.  Right Ear: Tympanic membrane normal.  Left Ear: Tympanic membrane normal.  Nose: Rhinorrhea present. No mucosal edema. Right sinus exhibits no frontal sinus tenderness. Left sinus exhibits no frontal sinus tenderness.  Mouth/Throat: Uvula is midline, oropharynx is clear and moist and mucous membranes are normal.  Eyes: Conjunctivae are normal.  Neck: Neck supple.  Cardiovascular: Normal rate, regular rhythm, normal heart sounds and intact distal pulses.   Pulmonary/Chest: Effort normal and breath sounds normal. No respiratory distress. She has  no wheezes. She has no rales. She exhibits no tenderness.  Neurological: She is alert and oriented to person, place, and time.  Skin: Skin is warm and dry. No rash noted.  Psychiatric: Affect normal.  Vitals reviewed.   Recent Results (from the past 2160 hour(s))  Basic metabolic panel     Status: Abnormal   Collection Time: 12/18/15  9:15 AM  Result Value Ref Range   Sodium 137 135 - 145 mmol/L   Potassium 4.5 3.5 - 5.1 mmol/L   Chloride 106 101 - 111 mmol/L   CO2 24 22 - 32 mmol/L   Glucose, Bld 112 (H) 65 - 99 mg/dL   BUN 19 6 - 20 mg/dL   Creatinine, Ser 1.10 (H) 0.44 - 1.00 mg/dL   Calcium 9.9 8.9 - 10.3 mg/dL   GFR calc non Af Amer 58 (L) >60 mL/min   GFR calc  Af Amer >60 >60 mL/min    Comment: (NOTE) The eGFR has been calculated using the CKD EPI equation. This calculation has not been validated in all clinical situations. eGFR's persistently <60 mL/min signify possible Chronic Kidney Disease.    Anion gap 7 5 - 15  CBC     Status: Abnormal   Collection Time: 12/18/15  9:15 AM  Result Value Ref Range   WBC 9.5 4.0 - 10.5 K/uL   RBC 5.05 3.87 - 5.11 MIL/uL   Hemoglobin 15.3 (H) 12.0 - 15.0 g/dL   HCT 44.3 36.0 - 46.0 %   MCV 87.7 78.0 - 100.0 fL   MCH 30.3 26.0 - 34.0 pg   MCHC 34.5 30.0 - 36.0 g/dL   RDW 12.3 11.5 - 15.5 %   Platelets 246 150 - 400 K/uL  Troponin I (q 6hr x 3)     Status: Abnormal   Collection Time: 12/18/15  4:37 PM  Result Value Ref Range   Troponin I 0.06 (HH) <0.03 ng/mL    Comment: CRITICAL RESULT CALLED TO, READ BACK BY AND VERIFIED WITH: Melida Gimenez 1801 12/18/15 D BRADLEY   TSH     Status: None   Collection Time: 12/18/15  4:37 PM  Result Value Ref Range   TSH 1.304 0.350 - 4.500 uIU/mL    Comment: Performed by a 3rd Generation assay with a functional sensitivity of <=0.01 uIU/mL.  Troponin I (q 6hr x 3)     Status: Abnormal   Collection Time: 12/18/15  8:25 PM  Result Value Ref Range   Troponin I 0.04 (HH) <0.03 ng/mL    Comment: CRITICAL VALUE NOTED.  VALUE IS CONSISTENT WITH PREVIOUSLY REPORTED AND CALLED VALUE.  Troponin I (q 6hr x 3)     Status: None   Collection Time: 12/19/15  1:54 AM  Result Value Ref Range   Troponin I <0.03 <0.03 ng/mL  Basic metabolic panel     Status: Abnormal   Collection Time: 12/19/15  1:55 AM  Result Value Ref Range   Sodium 137 135 - 145 mmol/L   Potassium 3.7 3.5 - 5.1 mmol/L    Comment: DELTA CHECK NOTED   Chloride 108 101 - 111 mmol/L   CO2 25 22 - 32 mmol/L   Glucose, Bld 106 (H) 65 - 99 mg/dL   BUN 9 6 - 20 mg/dL   Creatinine, Ser 0.88 0.44 - 1.00 mg/dL   Calcium 8.4 (L) 8.9 - 10.3 mg/dL   GFR calc non Af Amer >60 >60 mL/min   GFR calc Af Amer >60 >60  mL/min  Comment: (NOTE) The eGFR has been calculated using the CKD EPI equation. This calculation has not been validated in all clinical situations. eGFR's persistently <60 mL/min signify possible Chronic Kidney Disease.    Anion gap 4 (L) 5 - 15  CBC     Status: Abnormal   Collection Time: 12/19/15  1:55 AM  Result Value Ref Range   WBC 3.9 (L) 4.0 - 10.5 K/uL   RBC 3.57 (L) 3.87 - 5.11 MIL/uL   Hemoglobin 10.6 (L) 12.0 - 15.0 g/dL    Comment: REPEATED TO VERIFY DELTA CHECK NOTED    HCT 31.8 (L) 36.0 - 46.0 %   MCV 89.1 78.0 - 100.0 fL   MCH 29.7 26.0 - 34.0 pg   MCHC 33.3 30.0 - 36.0 g/dL   RDW 12.4 11.5 - 15.5 %   Platelets 147 (L) 150 - 400 K/uL  Urinalysis, Routine w reflex microscopic (not at [facility])     Status: Abnormal   Collection Time: 12/19/15  2:05 AM  Result Value Ref Range   Color, Urine YELLOW YELLOW   APPearance HAZY (A) CLEAR   Specific Gravity, Urine 1.010 1.005 - 1.030   pH 5.0 5.0 - 8.0   Glucose, UA NEGATIVE NEGATIVE mg/dL   Hgb urine dipstick SMALL (A) NEGATIVE   Bilirubin Urine NEGATIVE NEGATIVE   Ketones, ur NEGATIVE NEGATIVE mg/dL   Protein, ur NEGATIVE NEGATIVE mg/dL   Nitrite NEGATIVE NEGATIVE   Leukocytes, UA LARGE (A) NEGATIVE  Urine microscopic-add on     Status: Abnormal   Collection Time: 12/19/15  2:05 AM  Result Value Ref Range   Squamous Epithelial / LPF 6-30 (A) NONE SEEN   WBC, UA 6-30 0 - 5 WBC/hpf   RBC / HPF 0-5 0 - 5 RBC/hpf   Bacteria, UA FEW (A) NONE SEEN  CBC     Status: Abnormal   Collection Time: 12/19/15 11:36 AM  Result Value Ref Range   WBC 3.4 (L) 4.0 - 10.5 K/uL   RBC 3.70 (L) 3.87 - 5.11 MIL/uL   Hemoglobin 10.9 (L) 12.0 - 15.0 g/dL   HCT 33.3 (L) 36.0 - 46.0 %   MCV 90.0 78.0 - 100.0 fL   MCH 29.5 26.0 - 34.0 pg   MCHC 32.7 30.0 - 36.0 g/dL   RDW 12.5 11.5 - 15.5 %   Platelets 150 150 - 400 K/uL  Occult blood card to lab, stool RN will collect     Status: None   Collection Time: 12/19/15  3:44 PM  Result  Value Ref Range   Fecal Occult Bld NEGATIVE NEGATIVE  Hemoglobin and hematocrit, blood     Status: Abnormal   Collection Time: 12/19/15  3:55 PM  Result Value Ref Range   Hemoglobin 11.0 (L) 12.0 - 15.0 g/dL   HCT 33.2 (L) 36.0 - 46.0 %    Assessment/Plan: 1. Viral sinusitis Rx Flonase. Saline nasal rinses. Humidifier in bedroom. Supportive measures and OTC medications reviewed.  - fluticasone (FLONASE) 50 MCG/ACT nasal spray; Place 2 sprays into both nostrils daily as needed for allergies. Reported on 02/14/2015  Dispense: 16 g; Refill: 0   Leeanne Rio, [place]

## 2015-12-30 NOTE — Progress Notes (Signed)
Pre visit review using our clinic review tool, if applicable. No additional management support is needed unless otherwise documented below in the visit note. 

## 2016-01-02 ENCOUNTER — Encounter (HOSPITAL_COMMUNITY): Payer: Self-pay | Admitting: Nurse Practitioner

## 2016-01-02 ENCOUNTER — Ambulatory Visit (HOSPITAL_COMMUNITY)
Admission: RE | Admit: 2016-01-02 | Discharge: 2016-01-02 | Disposition: A | Discharge status: Home / Self Care | Payer: BC Managed Care – PPO | Source: Ambulatory Visit | Attending: Nurse Practitioner | Admitting: Nurse Practitioner

## 2016-01-02 VITALS — BP 112/78 | HR 73 | Ht 64.0 in | Wt 147.0 lb

## 2016-01-02 DIAGNOSIS — Z803 Family history of malignant neoplasm of breast: Secondary | ICD-10-CM | POA: Insufficient documentation

## 2016-01-02 DIAGNOSIS — Z7982 Long term (current) use of aspirin: Secondary | ICD-10-CM | POA: Diagnosis not present

## 2016-01-02 DIAGNOSIS — G4700 Insomnia, unspecified: Secondary | ICD-10-CM | POA: Insufficient documentation

## 2016-01-02 DIAGNOSIS — F419 Anxiety disorder, unspecified: Secondary | ICD-10-CM | POA: Insufficient documentation

## 2016-01-02 DIAGNOSIS — D649 Anemia, unspecified: Secondary | ICD-10-CM | POA: Diagnosis not present

## 2016-01-02 DIAGNOSIS — Z9049 Acquired absence of other specified parts of digestive tract: Secondary | ICD-10-CM | POA: Insufficient documentation

## 2016-01-02 DIAGNOSIS — Z882 Allergy status to sulfonamides status: Secondary | ICD-10-CM | POA: Diagnosis not present

## 2016-01-02 DIAGNOSIS — Z885 Allergy status to narcotic agent status: Secondary | ICD-10-CM | POA: Diagnosis not present

## 2016-01-02 DIAGNOSIS — I480 Paroxysmal atrial fibrillation: Secondary | ICD-10-CM | POA: Diagnosis not present

## 2016-01-02 DIAGNOSIS — Z8619 Personal history of other infectious and parasitic diseases: Secondary | ICD-10-CM | POA: Insufficient documentation

## 2016-01-02 DIAGNOSIS — Z8249 Family history of ischemic heart disease and other diseases of the circulatory system: Secondary | ICD-10-CM | POA: Insufficient documentation

## 2016-01-02 DIAGNOSIS — F329 Major depressive disorder, single episode, unspecified: Secondary | ICD-10-CM | POA: Insufficient documentation

## 2016-01-02 DIAGNOSIS — I4891 Unspecified atrial fibrillation: Secondary | ICD-10-CM | POA: Diagnosis present

## 2016-01-02 NOTE — Progress Notes (Signed)
Primary Care Physician: Annye Asa, MD Referring Physician: [facility] ER f/u   Jenna Cortez is a 50 y.o. female with a h/o paroxysmal atrail fibrillation in the afib clinic for evaluation. She was recently treated in the ER for viral GI illness with N/V and afib with rvr. She was hydrated and given cardizem and retruned to SR.    She has not noted any further sustained periods of heart irregularity.She takes 1/2 tab of metoprolol daily and an extra 1/2 tab if needed for afib. So far, afib burden since 2013 very low. She has no outstanding lifestyle issues that contribute to afib burden.. Chadsvasc score of 1 for female and will continue on asa.   Today, she denies symptoms of palpitations, chest pain, shortness of breath, orthopnea, PND, lower extremity edema, dizziness, presyncope, syncope, or neurologic sequela. The patient is tolerating medications without difficulties and is otherwise without complaint today.   Past Medical History:  Diagnosis Date  . Anxiety   . Atrial fibrillation ([place])   . Atrial tachycardia ([place])   . BRCA1 negative    NEGATIVE MUTATION 01/05/06  . BRCA2 negative    NEGATIVE MUTATION 01/05/06  . Depression   . Dysplasia of cervix, unspecified    HISTORY OF CERVICAL DYSPLASIA  . Herpes    HERPES SIMPLEX VIRUS  . Hyperprolactinemia ([place])   . Insomnia   . Migraine    "2-3/year" (12/18/2015)  . TMJ (dislocation of temporomandibular joint)    Past Surgical History:  Procedure Laterality Date  . AUGMENTATION MAMMAPLASTY Bilateral 1998  . BREAST BIOPSY Bilateral 2001-2003   RIGHT BREAST BIOPSY 2001-PAPILOMA/LT BR BXY-BENIGN 2003  . CERVIX LESION DESTRUCTION  ~ 1993  . CLAVICLE HARDWARE REMOVAL Right ~ 2008  . EYE SURGERY    . FRACTURE SURGERY    . HYSTEROSCOPY N/A 07/27/2013   Procedure: HYSTEROSCOPY WITH HYDROTHERMAL ABLATION;  Surgeon: Terrance Mass, MD;  Location: [facility];  Service: Gynecology;  Laterality: N/A;  . LAPAROSCOPIC CHOLECYSTECTOMY   2011  . ORIF CLAVICLE FRACTURE Right ~ 2008   CLAVICLE FRACTURE-TRAUMATIC  . REFRACTIVE SURGERY Bilateral 1990s  . SHOULDER ARTHROSCOPY Right 2016   "labrium cartilage repair"  . SHOULDER ARTHROSCOPY WITH ROTATOR CUFF REPAIR Right 2000    Current Outpatient Prescriptions  Medication Sig Dispense Refill  . aspirin EC 81 MG tablet Take 81 mg by mouth daily.    . fluticasone (FLONASE) 50 MCG/ACT nasal spray Place 2 sprays into both nostrils daily as needed for allergies. Reported on 02/14/2015 16 g 0  . metoprolol succinate (TOPROL-XL) 25 MG 24 hr tablet Take 0.5 tablets (12.5 mg total) by mouth daily. 45 tablet 3  . PARoxetine (PAXIL-CR) 12.5 MG 24 hr tablet Take 1 tablet (12.5 mg total) by mouth every morning. 90 tablet 3   No current facility-administered medications for this encounter.     Allergies  Allergen Reactions  . Codeine Itching  . Sulfonamide Derivatives Rash    Social History   Social History  . Marital status: Married    Spouse name: N/A  . Number of children: N/A  . Years of education: N/A   Occupational History  . Not on file.   Social History Main Topics  . Smoking status: Never Smoker  . Smokeless tobacco: Never Used  . Alcohol use 0.0 oz/week     Comment: 12/18/2015 "might have a glass of wine 2-3 times/month"  . Drug use: No  . Sexual activity: Yes    Birth control/ protection:  Condom   Other Topics Concern  . Not on file   Social History Narrative  . No narrative on file    Family History  Problem Relation Age of Onset  . Breast cancer Mother   . Hypertension Father   . Atrial fibrillation Brother     ROS- All systems are reviewed and negative except as per the HPI above  Physical Exam: Vitals:   01/02/16 0833  BP: 112/78  Pulse: 73  Weight: 147 lb (66.7 kg)  Height: '5\' 4"'$  (1.626 m)   Wt Readings from Last 3 Encounters:  01/02/16 147 lb (66.7 kg)  12/30/15 144 lb (65.3 kg)  12/19/15 153 lb 12.8 oz (69.8 kg)    Labs: Lab  Results  Component Value Date   NA 137 12/19/2015   K 3.7 12/19/2015   CL 108 12/19/2015   CO2 25 12/19/2015   GLUCOSE 106 (H) 12/19/2015   BUN 9 12/19/2015   CREATININE 0.88 12/19/2015   CALCIUM 8.4 (L) 12/19/2015   Lab Results  Component Value Date   INR 1.18 04/01/2011   Lab Results  Component Value Date   CHOL 161 08/22/2015   HDL 77 08/22/2015   LDLCALC 69 08/22/2015   TRIG 74 08/22/2015     GEN- The patient is well appearing, alert and oriented x 3 today.   Head- normocephalic, atraumatic Eyes-  Sclera clear, conjunctiva pink Ears- hearing intact Oropharynx- clear Neck- supple, no JVP Lymph- no cervical lymphadenopathy Lungs- Clear to ausculation bilaterally, normal work of breathing Heart- Regular rate and rhythm, no murmurs, rubs or gallops, PMI not laterally displaced GI- soft, NT, ND, + BS Extremities- no clubbing, cyanosis, or edema MS- no significant deformity or atrophy Skin- no rash or lesion Psych- euthymic mood, full affect Neuro- strength and sensation are intact  EKG-NSR at 73 bpm Epic records reviewed    Assessment and Plan: 1. Paroxsymal atrial fibrillation Continue daily BB with an extra 1/2 tab if needed for episodes Diona Fanti only due to CHA2DS2VASc score of 1(female) Regular exercise encouraged and keeping weight in normal range  She will f/u with PCP 12/8 at which time ER requested repeat CBC, bmet and anemia profile if indicated Afib clinic as needed  Butch Penny C. Emerald Gehres, [facility] [address] [place], [place] [address] [phone]

## 2016-01-09 ENCOUNTER — Ambulatory Visit: Payer: BC Managed Care – PPO | Admitting: Family Medicine

## 2016-01-09 ENCOUNTER — Ambulatory Visit: Payer: BC Managed Care – PPO

## 2016-01-16 ENCOUNTER — Encounter: Payer: Self-pay | Admitting: Family Medicine

## 2016-01-16 ENCOUNTER — Ambulatory Visit (INDEPENDENT_AMBULATORY_CARE_PROVIDER_SITE_OTHER): Payer: BC Managed Care – PPO | Admitting: Family Medicine

## 2016-01-16 VITALS — BP 110/76 | HR 76 | Temp 98.5°F | Resp 16 | Ht 64.0 in | Wt 149.0 lb

## 2016-01-16 DIAGNOSIS — R05 Cough: Secondary | ICD-10-CM | POA: Diagnosis not present

## 2016-01-16 DIAGNOSIS — I4891 Unspecified atrial fibrillation: Secondary | ICD-10-CM

## 2016-01-16 DIAGNOSIS — R058 Other specified cough: Secondary | ICD-10-CM

## 2016-01-16 LAB — BASIC METABOLIC PANEL
BUN: 17 mg/dL (ref 6–23)
CO2: 31 mEq/L (ref 19–32)
Calcium: 9.6 mg/dL (ref 8.4–10.5)
Chloride: 103 mEq/L (ref 96–112)
Creatinine, Ser: 0.92 mg/dL (ref 0.40–1.20)
GFR: 69.1 mL/min (ref >60.00)
Glucose, Bld: 83 mg/dL (ref 70–99)
Potassium: 4.6 mEq/L (ref 3.5–5.1)
Sodium: 140 mEq/L (ref 135–145)

## 2016-01-16 LAB — CBC WITH DIFFERENTIAL/PLATELET
Basophils Absolute: 0 10*3/uL (ref 0.0–0.1)
Basophils Relative: 0.4 % (ref 0.0–3.0)
Eosinophils Absolute: 0.1 10*3/uL (ref 0.0–0.7)
Eosinophils Relative: 1.6 % (ref 0.0–5.0)
HCT: 39 % (ref 36.0–46.0)
Hemoglobin: 13.3 g/dL (ref 12.0–15.0)
Lymphocytes Relative: 20.6 % (ref 12.0–46.0)
Lymphs Abs: 1 10*3/uL (ref 0.7–4.0)
MCHC: 34 g/dL (ref 30.0–36.0)
MCV: 88.5 fl (ref 78.0–100.0)
Monocytes Absolute: 0.4 10*3/uL (ref 0.1–1.0)
Monocytes Relative: 8.5 % (ref 3.0–12.0)
Neutro Abs: 3.4 10*3/uL (ref 1.4–7.7)
Neutrophils Relative %: 68.9 % (ref 43.0–77.0)
Platelets: 255 10*3/uL (ref 150.0–400.0)
RBC: 4.41 Mil/uL (ref 3.87–5.11)
RDW: 13.1 % (ref 11.5–15.5)
WBC: 4.9 10*3/uL (ref 4.0–10.5)

## 2016-01-16 LAB — IBC PANEL
Iron: 110 ug/dL (ref 42–145)
Saturation Ratios: 33.9 % (ref 20.0–50.0)
Transferrin: 232 mg/dL (ref 212.0–360.0)

## 2016-01-16 MED ORDER — PREDNISONE 10 MG PO TABS: ORAL_TABLET | ORAL | 0 refills | Status: DC

## 2016-01-16 NOTE — Patient Instructions (Signed)
Follow up as needed/scheduled We'll notify you of your lab results and make any changes if needed Add the Prednisone as directed.  Take w/ food.  3 pills at the same time for 3 days, and then 2 pills at the same time for 3 days and then 1 pill daily Drink plenty of fluids Continue the Aspirin and Metoprolol for the Afib Call with any questions or concerns Happy Holidays!!!

## 2016-01-16 NOTE — Progress Notes (Signed)
   Subjective:    Patient ID: Jenna Cortez, female    DOB: 01/16/1966, 50 y.o.   MRN: [id number]  HPI Hospital f/u- pt was admitted 11/16-17 for Afib w/ RVR.  She was given Cardizem in ER and converted to NSR.  Due to CHADSVASC score of 1- she was d/c'd on ASA.  Pt was d/c'd on previous dose of Metoprolol.  She had f/u w/ Afib clinic and they did not feel any additional medication was required.  Pt did have mild anemia (Hgb 10.9) and hospital d/c indicated we repeat CBC/BMP/Iron panel.  Pt will have intermittent flutters and is to f/u w/ Afib clinic as needed.  No CP, SOB, HAs, visual changes, edema.  Post-infectious cough- pt was tx'd for sinus infxn on 11/28.  Continues to have dry, hacking cough and fatigue.  No fevers.  Otherwise feeling well.   Review of Systems For ROS see HPI     Objective:   Physical Exam  Constitutional: She is oriented to person, place, and time. She appears well-developed and well-nourished. No distress.  HENT:  Head: Normocephalic and atraumatic.  Eyes: Conjunctivae and EOM are normal. Pupils are equal, round, and reactive to light.  Neck: Normal range of motion. Neck supple. No thyromegaly present.  Cardiovascular: Normal rate, regular rhythm, normal heart sounds and intact distal pulses.   No murmur heard. Pulmonary/Chest: Effort normal and breath sounds normal. No respiratory distress.  Dry hacking cough  Abdominal: Soft. She exhibits no distension. There is no tenderness.  Musculoskeletal: She exhibits no edema.  Lymphadenopathy:    She has no cervical adenopathy.  Neurological: She is alert and oriented to person, place, and time.  Skin: Skin is warm and dry.  Psychiatric: She has a normal mood and affect. Her behavior is normal.  Vitals reviewed.         Assessment & Plan:  Afib w/ RVR- pt is currently asymptomatic.  On ASA and Metoprolol w/ a 2nd dose of beta blocker as needed for sxs.  Will check CBC, iron panel, and BMP as recommended in  D/C summary.  Pt to f/u w/ Afib clinic as needed  Post-infectious cough- new.  Pt's sxs and PE consistent w/ post viral cough.  Start Pred taper as pt cannot take NSAIDs and is fearful of albuterol due to increased heart rate.  Pt expressed understanding and is in agreement w/ plan.

## 2016-01-16 NOTE — Progress Notes (Signed)
Pre visit review using our clinic review tool, if applicable. No additional management support is needed unless otherwise documented below in the visit note. 

## 2016-02-28 ENCOUNTER — Other Ambulatory Visit: Payer: Self-pay | Admitting: Internal Medicine

## 2016-03-05 ENCOUNTER — Ambulatory Visit: Payer: BC Managed Care – PPO | Admitting: Gynecology

## 2016-03-16 ENCOUNTER — Ambulatory Visit: Payer: BC Managed Care – PPO | Admitting: Gynecology

## 2016-03-19 ENCOUNTER — Ambulatory Visit: Payer: BC Managed Care – PPO

## 2016-04-22 ENCOUNTER — Ambulatory Visit (INDEPENDENT_AMBULATORY_CARE_PROVIDER_SITE_OTHER): Payer: BC Managed Care – PPO | Admitting: Otolaryngology

## 2016-04-22 DIAGNOSIS — R070 Pain in throat: Secondary | ICD-10-CM

## 2016-04-22 DIAGNOSIS — K219 Gastro-esophageal reflux disease without esophagitis: Secondary | ICD-10-CM | POA: Diagnosis not present

## 2016-05-14 ENCOUNTER — Ambulatory Visit: Payer: BC Managed Care – PPO

## 2016-05-27 ENCOUNTER — Ambulatory Visit: Payer: BC Managed Care – PPO

## 2016-06-11 ENCOUNTER — Ambulatory Visit: Payer: BC Managed Care – PPO

## 2016-06-16 ENCOUNTER — Encounter: Payer: Self-pay | Admitting: Gynecology

## 2016-08-13 ENCOUNTER — Encounter: Payer: BC Managed Care – PPO | Admitting: Gynecology

## 2016-08-13 ENCOUNTER — Encounter: Payer: Self-pay | Admitting: Physician Assistant

## 2016-08-13 ENCOUNTER — Ambulatory Visit (INDEPENDENT_AMBULATORY_CARE_PROVIDER_SITE_OTHER): Payer: BC Managed Care – PPO | Admitting: Physician Assistant

## 2016-08-13 VITALS — BP 100/62 | HR 62 | Temp 98.2°F | Resp 14 | Ht 64.0 in | Wt 154.0 lb

## 2016-08-13 DIAGNOSIS — B9789 Other viral agents as the cause of diseases classified elsewhere: Secondary | ICD-10-CM

## 2016-08-13 DIAGNOSIS — J329 Chronic sinusitis, unspecified: Secondary | ICD-10-CM

## 2016-08-13 DIAGNOSIS — B9689 Other specified bacterial agents as the cause of diseases classified elsewhere: Secondary | ICD-10-CM

## 2016-08-13 DIAGNOSIS — J208 Acute bronchitis due to other specified organisms: Secondary | ICD-10-CM

## 2016-08-13 MED ORDER — FLUTICASONE PROPIONATE 50 MCG/ACT NA SUSP: 2.0000 | Freq: Every day | NASAL | 0 refills | Status: DC | PRN

## 2016-08-13 MED ORDER — DOXYCYCLINE HYCLATE 100 MG PO CAPS: 100.0000 mg | ORAL_CAPSULE | Freq: Two times a day (BID) | ORAL | 0 refills | Status: DC

## 2016-08-13 NOTE — Patient Instructions (Signed)
Take antibiotic (Doxycycline) as directed.  Increase fluids.  Get plenty of rest. Use Delsym for cough. Take a daily probiotic (I recommend Align or Culturelle, but even Activia Yogurt may be beneficial).  A humidifier placed in the bedroom may offer some relief for a dry, scratchy throat of nasal irritation.  Read information below on acute bronchitis. Please call or return to clinic if symptoms are not improving.  Acute Bronchitis Bronchitis is when the airways that extend from the windpipe into the lungs get red, puffy, and painful (inflamed). Bronchitis often causes thick spit (mucus) to develop. This leads to a cough. A cough is the most common symptom of bronchitis. In acute bronchitis, the condition usually begins suddenly and goes away over time (usually in 2 weeks). Smoking, allergies, and asthma can make bronchitis worse. Repeated episodes of bronchitis may cause more lung problems.  HOME CARE  Rest.  Drink enough fluids to keep your pee (urine) clear or pale yellow (unless you need to limit fluids as told by your doctor).  Only take over-the-counter or prescription medicines as told by your doctor.  Avoid smoking and secondhand smoke. These can make bronchitis worse. If you are a smoker, think about using nicotine gum or skin patches. Quitting smoking will help your lungs heal faster.  Reduce the chance of getting bronchitis again by:  Washing your hands often.  Avoiding people with cold symptoms.  Trying not to touch your hands to your mouth, nose, or eyes.  Follow up with your doctor as told.  GET HELP IF: Your symptoms do not improve after 1 week of treatment. Symptoms include:  Cough.  Fever.  Coughing up thick spit.  Body aches.  Chest congestion.  Chills.  Shortness of breath.  Sore throat.  GET HELP RIGHT AWAY IF:   You have an increased fever.  You have chills.  You have severe shortness of breath.  You have bloody thick spit (sputum).  You  throw up (vomit) often.  You lose too much body fluid (dehydration).  You have a severe headache.  You faint.  MAKE SURE YOU:   Understand these instructions.  Will watch your condition.  Will get help right away if you are not doing well or get worse. Document Released: 07/07/2007 Document Revised: 09/20/2012 Document Reviewed: 07/11/2012 [facility] Patient Information 2015 [employer], [place]. This information is not intended to replace advice given to you by your health care provider. Make sure you discuss any questions you have with your health care provider.

## 2016-08-13 NOTE — Progress Notes (Signed)
Pre visit review using our clinic review tool, if applicable. No additional management support is needed unless otherwise documented below in the visit note. 

## 2016-08-13 NOTE — Progress Notes (Signed)
Patient presents to clinic today c/o 10 days of gradually worsening chest congestion with productive cough. Denies chest pain or SOB.  Denies sinus symptoms at present but noted PND at onset of symptoms. Notes mucous has changed from thin to thick and a dark yellow color. Denies recent travel or sick contact. Has taken Mucinex with little relief in symptoms.   Past Medical History:  Diagnosis Date  . Anxiety   . Atrial fibrillation ([place])   . Atrial tachycardia ([place])   . BRCA1 negative    NEGATIVE MUTATION 01/05/06  . BRCA2 negative    NEGATIVE MUTATION 01/05/06  . Depression   . Dysplasia of cervix, unspecified    HISTORY OF CERVICAL DYSPLASIA  . Herpes    HERPES SIMPLEX VIRUS  . Hyperprolactinemia ([place])   . Insomnia   . Migraine    "2-3/year" (12/18/2015)  . TMJ (dislocation of temporomandibular joint)     Current Outpatient Prescriptions on File Prior to Visit  Medication Sig Dispense Refill  . aspirin EC 81 MG tablet Take 81 mg by mouth daily.    . fluticasone (FLONASE) 50 MCG/ACT nasal spray Place 2 sprays into both nostrils daily as needed for allergies. Reported on 02/14/2015 16 g 0  . metoprolol succinate (TOPROL-XL) 25 MG 24 hr tablet TAKE 1/2 TABLET BY MOUTH DAILY. 45 tablet 3  . PARoxetine (PAXIL-CR) 12.5 MG 24 hr tablet Take 1 tablet (12.5 mg total) by mouth every morning. 90 tablet 3   No current facility-administered medications on file prior to visit.     Allergies  Allergen Reactions  . Codeine Itching  . Sulfonamide Derivatives Rash    Family History  Problem Relation Age of Onset  . Breast cancer Mother   . Hypertension Father   . Atrial fibrillation Brother     Social History   Social History  . Marital status: Married    Spouse name: N/A  . Number of children: N/A  . Years of education: N/A   Social History Main Topics  . Smoking status: Never Smoker  . Smokeless tobacco: Never Used  . Alcohol use 0.0 oz/week     Comment: 12/18/2015  "might have a glass of wine 2-3 times/month"  . Drug use: No  . Sexual activity: Yes    Birth control/ protection: Condom   Other Topics Concern  . None   Social History Narrative  . None   Review of Systems - See HPI.  All other ROS are negative.  BP 100/62   Pulse 62   Temp 98.2 F (36.8 C) (Oral)   Resp 14   Ht '5\' 4"'$  (1.626 m)   Wt 154 lb (69.9 kg)   SpO2 98%   BMI 26.43 kg/m   Physical Exam  Constitutional: She is oriented to person, place, and time and well-developed, well-nourished, and in no distress.  HENT:  Head: Normocephalic and atraumatic.  Right Ear: External ear normal.  Left Ear: External ear normal.  Nose: Nose normal.  Mouth/Throat: Oropharynx is clear and moist. No oropharyngeal exudate.  TM within normal limits bilaterally.  Eyes: Conjunctivae are normal.  Cardiovascular: Normal rate, regular rhythm, normal heart sounds and intact distal pulses.   Pulmonary/Chest: Effort normal and breath sounds normal. No respiratory distress. She has no wheezes. She has no rales. She exhibits no tenderness.  Neurological: She is alert and oriented to person, place, and time.  Skin: Skin is warm and dry. No rash noted.  Psychiatric: Affect normal.  Assessment/Plan: 1. Acute bacterial bronchitis Start ABX. Incraese fluids. Delsym for cough. Place a humidifier in the bedroom. Restart Flonase. Follow-up if not resolving.  - doxycycline (VIBRAMYCIN) 100 MG capsule; Take 1 capsule (100 mg total) by mouth 2 (two) times daily.  Dispense: 14 capsule; Refill: 0   Leeanne Rio, [place]

## 2016-08-16 ENCOUNTER — Other Ambulatory Visit: Payer: Self-pay | Admitting: Gynecology

## 2016-08-16 DIAGNOSIS — Z1231 Encounter for screening mammogram for malignant neoplasm of breast: Secondary | ICD-10-CM

## 2016-08-20 ENCOUNTER — Ambulatory Visit (INDEPENDENT_AMBULATORY_CARE_PROVIDER_SITE_OTHER): Payer: BC Managed Care – PPO | Admitting: Gynecology

## 2016-08-20 ENCOUNTER — Encounter: Payer: Self-pay | Admitting: Gynecology

## 2016-08-20 VITALS — BP 110/78 | Ht 64.0 in | Wt 153.0 lb

## 2016-08-20 DIAGNOSIS — N951 Menopausal and female climacteric states: Secondary | ICD-10-CM

## 2016-08-20 DIAGNOSIS — Z01419 Encounter for gynecological examination (general) (routine) without abnormal findings: Secondary | ICD-10-CM | POA: Diagnosis not present

## 2016-08-20 DIAGNOSIS — R232 Flushing: Secondary | ICD-10-CM

## 2016-08-20 MED ORDER — PAROXETINE HCL ER 12.5 MG PO TB24: 12.5000 mg | ORAL_TABLET | ORAL | 4 refills | Status: DC

## 2016-08-20 NOTE — Patient Instructions (Signed)
Menopause and Hormone Replacement Therapy What is hormone replacement therapy? Hormone replacement therapy (HRT) is the use of artificial (synthetic) hormones to replace hormones that your body stops producing during menopause. Menopause is the normal time of life when menstrual periods stop completely and the ovaries stop producing the female hormones estrogen and progesterone. This lack of hormones can affect your health and cause undesirable symptoms. HRT can relieve some of those symptoms. What are my options for HRT? HRT may consist of the synthetic hormones estrogen and progestin, or it may consist of only estrogen (estrogen-only therapy). You and your health care provider will decide which form of HRT is best for you. If you choose to be on HRT and you have a uterus, estrogen and progestin are usually prescribed. Estrogen-only therapy is used for women who do not have a uterus. Possible options for taking HRT include:  Pills.  Patches.  Gels.  Sprays.  Vaginal cream.  Vaginal rings.  Vaginal inserts.  The amount of hormone(s) that you take and how long you take the hormone(s) varies depending on your individual health. It is important to:  Begin HRT with the lowest possible dosage.  Stop HRT as soon as your health care provider tells you to stop.  Work with your health care provider so that you feel informed and comfortable with your decisions.  What are the benefits of HRT? HRT can reduce the frequency and severity of menopausal symptoms. Benefits of HRT vary depending on the menopausal symptoms that you have, the severity of your symptoms, and your overall health. HRT may help to improve the following menopausal symptoms:  Hot flashes and night sweats. These are sudden feelings of heat that spread over the face and body. The skin may turn red, like a blush. Night sweats are hot flashes that happen while you are sleeping or trying to sleep.  Bone loss (osteoporosis). The  body loses calcium more quickly after menopause, causing the bones to become weaker. This can increase the risk for bone breaks (fractures).  Vaginal dryness. The lining of the vagina can become thin and dry, which can cause pain during sexual intercourse or cause infection, burning, or itching.  Urinary tract infections.  Urinary incontinence. This is a decreased ability to control when you urinate.  Irritability.  Short-term memory problems.  What are the risks of HRT? Risks of HRT vary depending on your individual health and medical history. Risks of HRT also depend on whether you receive both estrogen and progestin or you receive estrogen only.HRT may increase the risk of:  Spotting. This is when a small amount of bloodleaks from the vagina unexpectedly.  Endometrial cancer. This cancer is in the lining of the uterus (endometrium).  Breast cancer.  Increased density of breast tissue. This can make it harder to find breast cancer on a breast X-ray (mammogram).  Stroke.  Heart attack.  Blood clots.  Gallbladder disease.  Risks of HRT can increase if you have any of the following conditions:  Endometrial cancer.  Liver disease.  Heart disease.  Breast cancer.  History of blood clots.  History of stroke.  How should I care for myself while I am on HRT?  Take over-the-counter and prescription medicines only as told by your health care provider.  Get mammograms, pelvic exams, and medical checkups as often as told by your health care provider.  Have Pap tests done as often as told by your health care provider. A Pap test is sometimes called a Pap   smear. It is a screening test that is used to check for signs of cancer of the cervix and vagina. A Pap test can also identify the presence of infection or precancerous changes. Pap tests may be done: ? Every 3 years, starting at age 38. ? Every 5 years, starting after age 56, in combination with testing for human  papillomavirus (HPV). ? More often or less often depending on other medical conditions you have, your age, and other risk factors.  It is your responsibility to get your Pap test results. Ask your health care provider or the department performing the test when your results will be ready.  Keep all follow-up visits as told by your health care provider. This is important. When should I seek medical care? Talk with your health care provider if:  You have any of these: ? Pain or swelling in your legs. ? Shortness of breath. ? Chest pain. ? Lumps or changes in your breasts or armpits. ? Slurred speech. ? Pain, burning, or bleeding when you urine.  You develop any of these: ? Unusual vaginal bleeding. ? Dizziness or headaches. ? Weakness or numbness in any part of your arms or legs. ? Pain in your abdomen.  This information is not intended to replace advice given to you by your health care provider. Make sure you discuss any questions you have with your health care provider. Document Released: 10/17/2002 Document Revised: 12/16/2015 Document Reviewed: 07/22/2014 Elsevier Interactive Patient Education  2017 [employer] Perimenopause Perimenopause is the time when your body begins to move into the menopause (no menstrual period for 12 straight months). It is a natural process. Perimenopause can begin 2-8 years before the menopause and usually lasts for 1 year after the menopause. During this time, your ovaries may or may not produce an egg. The ovaries vary in their production of estrogen and progesterone hormones each month. This can cause irregular menstrual periods, difficulty getting pregnant, vaginal bleeding between periods, and uncomfortable symptoms. What are the causes?  Irregular production of the ovarian hormones, estrogen and progesterone, and not ovulating every month. Other causes include:  Tumor of the pituitary gland in the brain.  Medical disease that affects the  ovaries.  Radiation treatment.  Chemotherapy.  Unknown causes.  Heavy smoking and excessive alcohol intake can bring on perimenopause sooner.  What are the signs or symptoms?  Hot flashes.  Night sweats.  Irregular menstrual periods.  Decreased sex drive.  Vaginal dryness.  Headaches.  Mood swings.  Depression.  Memory problems.  Irritability.  Tiredness.  Weight gain.  Trouble getting pregnant.  The beginning of losing bone cells (osteoporosis).  The beginning of hardening of the arteries (atherosclerosis). How is this diagnosed? Your health care provider will make a diagnosis by analyzing your age, menstrual history, and symptoms. He or she will do a physical exam and note any changes in your body, especially your female organs. Female hormone tests may or may not be helpful depending on the amount of female hormones you produce and when you produce them. However, other hormone tests may be helpful to rule out other problems. How is this treated? In some cases, no treatment is needed. The decision on whether treatment is necessary during the perimenopause should be made by you and your health care provider based on how the symptoms are affecting you and your lifestyle. Various treatments are available, such as:  Treating individual symptoms with a specific medicine for that symptom.  Herbal medicines that can help  specific symptoms.  Counseling.  Group therapy.  Follow these instructions at home:  Keep track of your menstrual periods (when they occur, how heavy they are, how long between periods, and how long they last) as well as your symptoms and when they started.  Only take over-the-counter or prescription medicines as directed by your health care provider.  Sleep and rest.  Exercise.  Eat a diet that contains calcium (good for your bones) and soy (acts like the estrogen hormone).  Do not smoke.  Avoid alcoholic beverages.  Take vitamin  supplements as recommended by your health care provider. Taking vitamin E may help in certain cases.  Take calcium and vitamin D supplements to help prevent bone loss.  Group therapy is sometimes helpful.  Acupuncture may help in some cases. Contact a health care provider if:  You have questions about any symptoms you are having.  You need a referral to a specialist (gynecologist, psychiatrist, or psychologist). Get help right away if:  You have vaginal bleeding.  Your period lasts longer than 8 days.  Your periods are recurring sooner than 21 days.  You have bleeding after intercourse.  You have severe depression.  You have pain when you urinate.  You have severe headaches.  You have vision problems. This information is not intended to replace advice given to you by your health care provider. Make sure you discuss any questions you have with your health care provider. Document Released: 02/26/2004 Document Revised: 06/26/2015 Document Reviewed: 08/17/2012 Elsevier Interactive Patient Education  2017 [employer]. Colonoscopy, Adult A colonoscopy is an exam to look at the entire large intestine. During the exam, a lubricated, bendable tube is inserted into the anus and then passed into the rectum, colon, and other parts of the large intestine. A colonoscopy is often done as a part of normal colorectal screening or in response to certain symptoms, such as anemia, persistent diarrhea, abdominal pain, and blood in the stool. The exam can help screen for and diagnose medical problems, including:  Tumors.  Polyps.  Inflammation.  Areas of bleeding.  Tell a health care provider about:  Any allergies you have.  All medicines you are taking, including vitamins, herbs, eye drops, creams, and over-the-counter medicines.  Any problems you or family members have had with anesthetic medicines.  Any blood disorders you have.  Any surgeries you have had.  Any medical  conditions you have.  Any problems you have had passing stool. What are the risks? Generally, this is a safe procedure. However, problems may occur, including:  Bleeding.  A tear in the intestine.  A reaction to medicines given during the exam.  Infection (rare).  What happens before the procedure? Eating and drinking restrictions Follow instructions from your health care provider about eating and drinking, which may include:  A few days before the procedure - follow a low-fiber diet. Avoid nuts, seeds, dried fruit, raw fruits, and vegetables.  1-3 days before the procedure - follow a clear liquid diet. Drink only clear liquids, such as clear broth or bouillon, black coffee or tea, clear juice, clear soft drinks or sports drinks, gelatin dessert, and popsicles. Avoid any liquids that contain red or purple dye.  On the day of the procedure - do not eat or drink anything during the 2 hours before the procedure, or within the time period that your health care provider recommends.  Bowel prep If you were prescribed an oral bowel prep to clean out your colon:  Take it  as told by your health care provider. Starting the day before your procedure, you will need to drink a large amount of medicated liquid. The liquid will cause you to have multiple loose stools until your stool is almost clear or light green.  If your skin or anus gets irritated from diarrhea, you may use these to relieve the irritation: ? Medicated wipes, such as adult wet wipes with aloe and vitamin E. ? A skin soothing-product like petroleum jelly.  If you vomit while drinking the bowel prep, take a break for up to 60 minutes and then begin the bowel prep again. If vomiting continues and you cannot take the bowel prep without vomiting, call your health care provider.  General instructions  Ask your health care provider about changing or stopping your regular medicines. This is especially important if you are taking  diabetes medicines or blood thinners.  Plan to have someone take you home from the hospital or clinic. What happens during the procedure?  An IV tube may be inserted into one of your veins.  You will be given medicine to help you relax (sedative).  To reduce your risk of infection: ? Your health care team will wash or sanitize their hands. ? Your anal area will be washed with soap.  You will be asked to lie on your side with your knees bent.  Your health care provider will lubricate a long, thin, flexible tube. The tube will have a camera and a light on the end.  The tube will be inserted into your anus.  The tube will be gently eased through your rectum and colon.  Air will be delivered into your colon to keep it open. You may feel some pressure or cramping.  The camera will be used to take images during the procedure.  A small tissue sample may be removed from your body to be examined under a microscope (biopsy). If any potential problems are found, the tissue will be sent to a lab for testing.  If small polyps are found, your health care provider may remove them and have them checked for cancer cells.  The tube that was inserted into your anus will be slowly removed. The procedure may vary among health care providers and hospitals. What happens after the procedure?  Your blood pressure, heart rate, breathing rate, and blood oxygen level will be monitored until the medicines you were given have worn off.  Do not drive for 24 hours after the exam.  You may have a small amount of blood in your stool.  You may pass gas and have mild abdominal cramping or bloating due to the air that was used to inflate your colon during the exam.  It is up to you to get the results of your procedure. Ask your health care provider, or the department performing the procedure, when your results will be ready. This information is not intended to replace advice given to you by your health care  provider. Make sure you discuss any questions you have with your health care provider. Document Released: 01/16/2000 Document Revised: 11/19/2015 Document Reviewed: 04/01/2015 Elsevier Interactive Patient Education  2018 [employer]

## 2016-08-20 NOTE — Progress Notes (Signed)
Jenna Cortez 14-Oct-1965 [id number]   History:    51 y.o.  for annual gyn exam with occasional hot flashes reported. Patient has been on Paxil CR 12.5 mg for depression with anxiety and has done well.Review of patient's record indicated that she has a past history of colon polyps in 2010 which was noted on screening colonoscopy because of family history of colon cancer. 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  In 2015 patient had resectoscopic polypectomy as well as endometrial ablation via hydro-thermal ablation technique and has been amenorrheic. Patient's been followed by the cardiologist in the past several result of PACs and PVCs as well as paroxysmal atrial fibrillation. She has been treated with metoprolol.  Patient's mother had breast cancer and patient has tested negative for the BRCA one BRCA2 gene mutation patient reports normal menstrual cycles and she's had an ablation.  Past medical history,surgical history, family history and social history were all reviewed and documented in the EPIC chart.  Gynecologic History No LMP recorded. Patient has had an ablation. Contraception: vasectomy Last Pap: 2015. Results were: normal Last mammogram: 2017. Results were: normal  Obstetric History OB History  Gravida Para Term Preterm AB Living  '2 2 2     2  '$ SAB TAB Ectopic Multiple Live Births          2    # Outcome Date GA Lbr Len/2nd Weight Sex Delivery Anes PTL Lv  2 Term     M Vag-Spont  N LIV  1 Term     F Vag-Spont  N LIV       ROS: A ROS was performed and pertinent positives and negatives are included in the history.  GENERAL: No fevers or chills. HEENT: No change in vision, no earache, sore throat or sinus congestion. NECK: No pain or stiffness. CARDIOVASCULAR: No chest pain or pressure. No palpitations. PULMONARY: No shortness of breath, cough or wheeze. GASTROINTESTINAL: No abdominal  pain, nausea, vomiting or diarrhea, melena or bright red blood per rectum. GENITOURINARY: No urinary frequency, urgency, hesitancy or dysuria. MUSCULOSKELETAL: No joint or muscle pain, no back pain, no recent trauma. DERMATOLOGIC: No rash, no itching, no lesions. ENDOCRINE: No polyuria, polydipsia, no heat or cold intolerance. No recent change in weight. HEMATOLOGICAL: No anemia or easy bruising or bleeding. NEUROLOGIC: No headache, seizures, numbness, tingling or weakness. PSYCHIATRIC: No depression, no loss of interest in normal activity or change in sleep pattern.     Exam: chaperone present  BP 110/78   Ht '5\' 4"'$  (1.626 m)   Wt 153 lb (69.4 kg)   BMI 26.26 kg/m   Body mass index is 26.26 kg/m.  General appearance : Well developed well nourished female. No acute distress HEENT: Eyes: no retinal hemorrhage or exudates,  Neck supple, trachea midline, no carotid bruits, no thyroidmegaly Lungs: Clear to auscultation, no rhonchi or wheezes, or rib retractions  Heart: Regular rate and rhythm, no murmurs or gallops Breast:Examined in sitting and supine position were symmetrical in appearance, no palpable masses or tenderness,  no skin retraction, no nipple inversion, no nipple discharge, no skin discoloration, no axillary or supraclavicular lymphadenopathy Abdomen: no palpable masses or tenderness, no rebound or guarding Extremities: no edema or skin discoloration or tenderness  Pelvic:  Bartholin, Urethra, Skene Glands: Within normal limits             Vagina: No gross lesions or discharge  Cervix: No gross lesions or discharge  Uterus  anteverted, normal size, shape and consistency, non-tender and mobile  Adnexa  Without masses or tenderness  Anus and perineum  normal   Rectovaginal  normal sphincter tone without palpated masses or tenderness             Hemoccult not indicated     Assessment/Plan:  51 y.o. female for annual exam doing well may be perimenopausal we'll check an [place]  along with TSH because of her hot flashes at times. Literature information on the menopause and hormone replacement therapy was provided. Pap smear was done today. Patient scheduled for mammogram next week. She was reminded to contact her gastroenterologist for her follow-up colonoscopy. We discussed importance of monthly breast exam. We discussed importance of calcium vitamin D and weightbearing exercises for osteoporosis prevention. Her PCP will be doing the rest of her blood work in a fasting state later this year.   Terrance Mass MD, 10:12 AM 08/20/2016

## 2016-08-21 LAB — FOLLICLE STIMULATING HORMONE: FSH: 78.6 m[IU]/mL

## 2016-08-21 LAB — TSH: TSH: 2.34 mIU/L

## 2016-08-23 LAB — PAP IG W/ RFLX HPV ASCU

## 2016-08-27 ENCOUNTER — Ambulatory Visit
Admission: RE | Admit: 2016-08-27 | Discharge: 2016-08-27 | Disposition: A | Discharge status: Home / Self Care | Payer: BC Managed Care – PPO | Source: Ambulatory Visit | Attending: Gynecology | Admitting: Gynecology

## 2016-08-27 DIAGNOSIS — Z1231 Encounter for screening mammogram for malignant neoplasm of breast: Secondary | ICD-10-CM

## 2016-11-23 ENCOUNTER — Other Ambulatory Visit: Payer: Self-pay | Admitting: Physician Assistant

## 2017-01-21 ENCOUNTER — Ambulatory Visit (HOSPITAL_COMMUNITY)
Admission: RE | Admit: 2017-01-21 | Discharge: 2017-01-21 | Disposition: A | Discharge status: Home / Self Care | Payer: BC Managed Care – PPO | Source: Ambulatory Visit | Attending: Family Medicine | Admitting: Family Medicine

## 2017-01-21 ENCOUNTER — Other Ambulatory Visit: Payer: Self-pay | Admitting: Physician Assistant

## 2017-01-21 ENCOUNTER — Other Ambulatory Visit (HOSPITAL_COMMUNITY): Payer: Self-pay | Admitting: Physician Assistant

## 2017-01-21 DIAGNOSIS — M7989 Other specified soft tissue disorders: Secondary | ICD-10-CM | POA: Diagnosis not present

## 2017-01-21 DIAGNOSIS — M79661 Pain in right lower leg: Secondary | ICD-10-CM

## 2017-01-21 DIAGNOSIS — M79604 Pain in right leg: Secondary | ICD-10-CM | POA: Diagnosis not present

## 2017-01-21 NOTE — Progress Notes (Signed)
Right lower extremity venous duplex completed. No evidence of DVT. Superficial thrombosis, or Baker's cyst [employer] [employer] S[employer]. RVS 01/21/2017,5:03 pm

## 2017-02-21 ENCOUNTER — Ambulatory Visit (INDEPENDENT_AMBULATORY_CARE_PROVIDER_SITE_OTHER): Payer: BC Managed Care – PPO | Admitting: Obstetrics & Gynecology

## 2017-02-21 ENCOUNTER — Encounter: Payer: Self-pay | Admitting: Obstetrics & Gynecology

## 2017-02-21 ENCOUNTER — Other Ambulatory Visit: Payer: Self-pay | Admitting: Internal Medicine

## 2017-02-21 VITALS — BP 128/78

## 2017-02-21 DIAGNOSIS — I4891 Unspecified atrial fibrillation: Secondary | ICD-10-CM | POA: Diagnosis not present

## 2017-02-21 DIAGNOSIS — N9411 Superficial (introital) dyspareunia: Secondary | ICD-10-CM | POA: Diagnosis not present

## 2017-02-21 DIAGNOSIS — F419 Anxiety disorder, unspecified: Secondary | ICD-10-CM

## 2017-02-21 DIAGNOSIS — N951 Menopausal and female climacteric states: Secondary | ICD-10-CM

## 2017-02-21 DIAGNOSIS — Z803 Family history of malignant neoplasm of breast: Secondary | ICD-10-CM | POA: Diagnosis not present

## 2017-02-21 MED ORDER — ESTRADIOL 10 MCG VA TABS: 1.0000 | ORAL_TABLET | VAGINAL | 4 refills | Status: DC

## 2017-02-21 MED ORDER — PAROXETINE HCL ER 25 MG PO TB24: 25.0000 mg | ORAL_TABLET | ORAL | 6 refills | Status: DC

## 2017-02-21 NOTE — Patient Instructions (Signed)
1. Menopause syndrome Severe vasomotor menopausal symptoms interfering with daily life.  Night sweats preventing good sleep.  Relative contraindication to hormone replacement therapy because of atrial fibrillation which increases her risk of thromboembolism and her family history of early breast cancer in her mother at age 50.  Patient did genetic testing which was negative for BRCA 1 and 2, but still has an increased risk because of her mother's history.  Decision therefore to increase her antidepressant Paxil from 12.5 mg daily to 25 mg daily, hoping for some improvement in the vasomotor menopausal symptoms and improvement in her moods.  Will also try a Phyto-Estrogen, like Estroven.  Will verify quality products in that category at [facility].  Will follow up with me in a month or 2 if no improvements in symptoms.  2. Superficial (introital) dyspareunia Decision to start on Vagifem to improve the quality of her skin at the vulva and vagina.  Usage, risks and benefits reviewed.  Astroglide as needed.  3. Family history of breast cancer in mother Mother with diagnosis of breast cancer at age 63.  Patient is BRCA 1 and 2 negative.  4. Atrial fibrillation with RVR (HCC) On metoprolol.  5. Anxiety Paxil CR increased from 12.5 daily to 25 mg daily.  Other orders - PARoxetine (PAXIL-CR) 25 MG 24 hr tablet; Take 1 tablet (25 mg total) by mouth every morning. - Estradiol 10 MCG TABS vaginal tablet; Place 1 tablet (10 mcg total) vaginally 2 (two) times a week. Place 1 tablet vaginally daily x 2 weeks, then 1 tablet vaginally two times a week.  Navea, it was a pleasure meeting you today!   Health Maintenance for Postmenopausal Women Menopause is a normal process in which your reproductive ability comes to an end. This process happens gradually over a span of months to years, usually between the ages of 105 and 46. Menopause is complete when you have missed 12 consecutive menstrual  periods. It is important to talk with your health care provider about some of the most common conditions that affect postmenopausal women, such as heart disease, cancer, and bone loss (osteoporosis). Adopting a healthy lifestyle and getting preventive care can help to promote your health and wellness. Those actions can also lower your chances of developing some of these common conditions. What should I know about menopause? During menopause, you may experience a number of symptoms, such as:  Moderate-to-severe hot flashes.  Night sweats.  Decrease in sex drive.  Mood swings.  Headaches.  Tiredness.  Irritability.  Memory problems.  Insomnia.  Choosing to treat or not to treat menopausal changes is an individual decision that you make with your health care provider. What should I know about hormone replacement therapy and supplements? Hormone therapy products are effective for treating symptoms that are associated with menopause, such as hot flashes and night sweats. Hormone replacement carries certain risks, especially as you become older. If you are thinking about using estrogen or estrogen with progestin treatments, discuss the benefits and risks with your health care provider. What should I know about heart disease and stroke? Heart disease, heart attack, and stroke become more likely as you age. This may be due, in part, to the hormonal changes that your body experiences during menopause. These can affect how your body processes dietary fats, triglycerides, and cholesterol. Heart attack and stroke are both medical emergencies. There are many things that you can do to help prevent heart disease and stroke:  Have your blood pressure checked at  least every 1-2 years. High blood pressure causes heart disease and increases the risk of stroke.  If you are 25-37 years old, ask your health care provider if you should take aspirin to prevent a heart attack or a stroke.  Do not use any  tobacco products, including cigarettes, chewing tobacco, or electronic cigarettes. If you need help quitting, ask your health care provider.  It is important to eat a healthy diet and maintain a healthy weight. ? Be sure to include plenty of vegetables, fruits, low-fat dairy products, and lean protein. ? Avoid eating foods that are high in solid fats, added sugars, or salt (sodium).  Get regular exercise. This is one of the most important things that you can do for your health. ? Try to exercise for at least 150 minutes each week. The type of exercise that you do should increase your heart rate and make you sweat. This is known as moderate-intensity exercise. ? Try to do strengthening exercises at least twice each week. Do these in addition to the moderate-intensity exercise.  Know your numbers.Ask your health care provider to check your cholesterol and your blood glucose. Continue to have your blood tested as directed by your health care provider.  What should I know about cancer screening? There are several types of cancer. Take the following steps to reduce your risk and to catch any cancer development as early as possible. Breast Cancer  Practice breast self-awareness. ? This means understanding how your breasts normally appear and feel. ? It also means doing regular breast self-exams. Let your health care provider know about any changes, no matter how small.  If you are 60 or older, have a clinician do a breast exam (clinical breast exam or CBE) every year. Depending on your age, family history, and medical history, it may be recommended that you also have a yearly breast X-ray (mammogram).  If you have a family history of breast cancer, talk with your health care provider about genetic screening.  If you are at high risk for breast cancer, talk with your health care provider about having an MRI and a mammogram every year.  Breast cancer (BRCA) gene test is recommended for women who  have family members with BRCA-related cancers. Results of the assessment will determine the need for genetic counseling and BRCA1 and for BRCA2 testing. BRCA-related cancers include these types: ? Breast. This occurs in males or females. ? Ovarian. ? Tubal. This may also be called fallopian tube cancer. ? Cancer of the abdominal or pelvic lining (peritoneal cancer). ? Prostate. ? Pancreatic.  Cervical, Uterine, and Ovarian Cancer Your health care provider may recommend that you be screened regularly for cancer of the pelvic organs. These include your ovaries, uterus, and vagina. This screening involves a pelvic exam, which includes checking for microscopic changes to the surface of your cervix (Pap test).  For women ages 21-65, health care providers may recommend a pelvic exam and a Pap test every three years. For women ages 50-65, they may recommend the Pap test and pelvic exam, combined with testing for human papilloma virus (HPV), every five years. Some types of HPV increase your risk of cervical cancer. Testing for HPV may also be done on women of any age who have unclear Pap test results.  Other health care providers may not recommend any screening for nonpregnant women who are considered low risk for pelvic cancer and have no symptoms. Ask your health care provider if a screening pelvic exam is right  for you.  If you have had past treatment for cervical cancer or a condition that could lead to cancer, you need Pap tests and screening for cancer for at least 20 years after your treatment. If Pap tests have been discontinued for you, your risk factors (such as having a new sexual partner) need to be reassessed to determine if you should start having screenings again. Some women have medical problems that increase the chance of getting cervical cancer. In these cases, your health care provider may recommend that you have screening and Pap tests more often.  If you have a family history of uterine  cancer or ovarian cancer, talk with your health care provider about genetic screening.  If you have vaginal bleeding after reaching menopause, tell your health care provider.  There are currently no reliable tests available to screen for ovarian cancer.  Lung Cancer Lung cancer screening is recommended for adults 42-76 years old who are at high risk for lung cancer because of a history of smoking. A yearly low-dose CT scan of the lungs is recommended if you:  Currently smoke.  Have a history of at least 30 pack-years of smoking and you currently smoke or have quit within the past 15 years. A pack-year is smoking an average of one pack of cigarettes per day for one year.  Yearly screening should:  Continue until it has been 15 years since you quit.  Stop if you develop a health problem that would prevent you from having lung cancer treatment.  Colorectal Cancer  This type of cancer can be detected and can often be prevented.  Routine colorectal cancer screening usually begins at age 88 and continues through age 40.  If you have risk factors for colon cancer, your health care provider may recommend that you be screened at an earlier age.  If you have a family history of colorectal cancer, talk with your health care provider about genetic screening.  Your health care provider may also recommend using home test kits to check for hidden blood in your stool.  A small camera at the end of a tube can be used to examine your colon directly (sigmoidoscopy or colonoscopy). This is done to check for the earliest forms of colorectal cancer.  Direct examination of the colon should be repeated every 5-10 years until age 74. However, if early forms of precancerous polyps or small growths are found or if you have a family history or genetic risk for colorectal cancer, you may need to be screened more often.  Skin Cancer  Check your skin from head to toe regularly.  Monitor any moles. Be sure to  tell your health care provider: ? About any new moles or changes in moles, especially if there is a change in a mole's shape or color. ? If you have a mole that is larger than the size of a pencil eraser.  If any of your family members has a history of skin cancer, especially at a young age, talk with your health care provider about genetic screening.  Always use sunscreen. Apply sunscreen liberally and repeatedly throughout the day.  Whenever you are outside, protect yourself by wearing long sleeves, pants, a wide-brimmed hat, and sunglasses.  What should I know about osteoporosis? Osteoporosis is a condition in which bone destruction happens more quickly than new bone creation. After menopause, you may be at an increased risk for osteoporosis. To help prevent osteoporosis or the bone fractures that can happen because of osteoporosis,  the following is recommended:  If you are 32-45 years old, get at least 1,000 mg of calcium and at least 600 mg of vitamin D per day.  If you are older than age 46 but younger than age 29, get at least 1,200 mg of calcium and at least 600 mg of vitamin D per day.  If you are older than age 5, get at least 1,200 mg of calcium and at least 800 mg of vitamin D per day.  Smoking and excessive alcohol intake increase the risk of osteoporosis. Eat foods that are rich in calcium and vitamin D, and do weight-bearing exercises several times each week as directed by your health care provider. What should I know about how menopause affects my mental health? Depression may occur at any age, but it is more common as you become older. Common symptoms of depression include:  Low or sad mood.  Changes in sleep patterns.  Changes in appetite or eating patterns.  Feeling an overall lack of motivation or enjoyment of activities that you previously enjoyed.  Frequent crying spells.  Talk with your health care provider if you think that you are experiencing  depression. What should I know about immunizations? It is important that you get and maintain your immunizations. These include:  Tetanus, diphtheria, and pertussis (Tdap) booster vaccine.  Influenza every year before the flu season begins.  Pneumonia vaccine.  Shingles vaccine.  Your health care provider may also recommend other immunizations. This information is not intended to replace advice given to you by your health care provider. Make sure you discuss any questions you have with your health care provider. Document Released: 03/12/2005 Document Revised: 08/08/2015 Document Reviewed: 10/22/2014 Elsevier Interactive Patient Education  2018 [employer].

## 2017-02-21 NOTE — Progress Notes (Signed)
    Jenna Cortez 1965-09-26 [id number]        52 y.o.  G2P2L2 Married  RP:  Worsening hot flushes, night sweat and mood swings  HPI: Status post endometrial ablation with no menses since then.  Patient is complaining of worsening moodiness with hot flashes and night sweats every day preventing good sleep.  She feels tired and her libido is decreased with a feeling of dryness and pain with intercourse.  Using condoms.  Personal history of atrial fibrillation on metoprolol.  Patient is not anticoagulated, but is taking one baby aspirin every day.  Mother with a history of breast cancer at age 34, disease now.  Patient had negative BRCA 1-2, done 10 years ago.  Screening mammogram negative in July 2018.  Pap test negative July 2018.  Past medical history,surgical history, problem list, medications, allergies, family history and social history were all reviewed and documented in the EPIC chart.  Directed ROS with pertinent positives and negatives documented in the history of present illness/assessment and plan.  Exam:  Vitals:   02/21/17 0815  BP: 128/78   General appearance:  Normal  Deferred.  Normal Gyn exam 08/20/2016   Assessment/Plan:  52 y.o. G2P2002   1. Menopause syndrome Severe vasomotor menopausal symptoms interfering with daily life.  Night sweats preventing good sleep.  Relative contraindication to hormone replacement therapy because of atrial fibrillation which increases her risk of thromboembolism and her family history of early breast cancer in her mother at age 34.  Patient did genetic testing which was negative for BRCA 1 and 2, but still has an increased risk because of her mother's history.  Decision therefore to increase her antidepressant Paxil from 12.5 mg daily to 25 mg daily, hoping for some improvement in the vasomotor menopausal symptoms and improvement in her moods.  Will also try a Phyto-Estrogen, like Estroven.  Will verify quality products in that category at  [facility].  Will follow up with me in a month or 2 if no improvements in symptoms.  2. Superficial (introital) dyspareunia Decision to start on Vagifem to improve the quality of her skin at the vulva and vagina.  Usage, risks and benefits reviewed.  Astroglide as needed.  3. Family history of breast cancer in mother Mother with diagnosis of breast cancer at age 68.  Patient is BRCA 1 and 2 negative.  4. Atrial fibrillation with RVR (HCC) On metoprolol.  5. Anxiety Paxil CR increased from 12.5 daily to 25 mg daily.  Other orders - PARoxetine (PAXIL-CR) 25 MG 24 hr tablet; Take 1 tablet (25 mg total) by mouth every morning. - Estradiol 10 MCG TABS vaginal tablet; Place 1 tablet (10 mcg total) vaginally 2 (two) times a week. Place 1 tablet vaginally daily x 2 weeks, then 1 tablet vaginally two times a week.  Counseling on above issues more than 50% for 25 minutes.  Princess Bruins MD, 8:33 AM 02/21/2017

## 2017-02-23 ENCOUNTER — Telehealth: Payer: Self-pay | Admitting: *Deleted

## 2017-02-23 MED ORDER — ESTRADIOL 10 MCG VA TABS: ORAL_TABLET | VAGINAL | 4 refills | Status: DC

## 2017-02-23 NOTE — Telephone Encounter (Signed)
Patient called stating vagfiem was never sent to pharmacy. Rx printed from [facility] 02/21/17. Rx sent.

## 2017-03-10 NOTE — Progress Notes (Signed)
Electrophysiology Office Note Date: 03/11/2017  ID:  Jenna Cortez, DOB September 15, 1965, MRN [id number]  PCP: Jenna Minium, MD Electrophysiologist: Jenna Cortez  CC: AF follow up  Jenna Cortez is a 52 y.o. female seen today for Dr Jenna Cortez.  She presents today for routine electrophysiology followup.  Since last being seen in our clinic, the patient reports doing reasonably well.  She has had increased AF burden over the last couple of months. Her episodes last for hours at a time and are associated with shortness of breath and dizziness.  She has taken extra BB without termination of episodes. They typically self terminate after several hours. She is also going through menopause currently.   She denies chest pain, PND, orthopnea, nausea, vomiting, dizziness, syncope, edema, weight gain, or early satiety.  Past Medical History:  Diagnosis Date  . Anxiety   . BRCA1 negative    NEGATIVE MUTATION 01/05/06  . BRCA2 negative    NEGATIVE MUTATION 01/05/06  . Depression   . Dysplasia of cervix, unspecified   . Herpes   . Hyperprolactinemia ([place])   . Insomnia   . Migraine    "2-3/year" (12/18/2015)  . Paroxysmal atrial fibrillation (HCC)   . TMJ (dislocation of temporomandibular joint)    Past Surgical History:  Procedure Laterality Date  . AUGMENTATION MAMMAPLASTY Bilateral 1998  . BREAST BIOPSY Bilateral 2001-2003   RIGHT BREAST BIOPSY 2001-PAPILOMA/LT BR BXY-BENIGN 2003  . CERVIX LESION DESTRUCTION  ~ 1993  . CLAVICLE HARDWARE REMOVAL Right ~ 2008  . EYE SURGERY    . FRACTURE SURGERY    . HYSTEROSCOPY N/A 07/27/2013   Procedure: HYSTEROSCOPY WITH HYDROTHERMAL ABLATION;  Surgeon: Jenna Mass, MD;  Location: [facility];  Service: Gynecology;  Laterality: N/A;  . LAPAROSCOPIC CHOLECYSTECTOMY  2011  . ORIF CLAVICLE FRACTURE Right ~ 2008   CLAVICLE FRACTURE-TRAUMATIC  . REFRACTIVE SURGERY Bilateral 1990s  . SHOULDER ARTHROSCOPY Right 2016   "labrium cartilage repair"  . SHOULDER  ARTHROSCOPY WITH ROTATOR CUFF REPAIR Right 2000    Current Outpatient Medications  Medication Sig Dispense Refill  . aspirin EC 81 MG tablet Take 81 mg by mouth daily.    . Estradiol 10 MCG TABS vaginal tablet Place 1 tablet vaginally daily for 2 weeks, then 1 tablet vaginally two times a week. 24 tablet 4  . fluticasone (FLONASE) 50 MCG/ACT nasal spray Place 2 sprays into both nostrils daily as needed for allergies. Reported on 02/14/2015 16 g 0  . metoprolol succinate (TOPROL-XL) 25 MG 24 hr tablet TAKE 1/2 TABLET BY MOUTH DAILY. 45 tablet 1  . PARoxetine (PAXIL-CR) 25 MG 24 hr tablet Take 1 tablet (25 mg total) by mouth every morning. 30 tablet 6  . flecainide (TAMBOCOR) 50 MG tablet Take 1 tablet (50 mg total) by mouth 2 (two) times daily. 60 tablet 3   No current facility-administered medications for this visit.     Allergies:   Codeine and Sulfonamide derivatives   Social History: Social History   Socioeconomic History  . Marital status: Married    Spouse name: Not on file  . Number of children: Not on file  . Years of education: Not on file  . Highest education level: Not on file  Social Needs  . Financial resource strain: Not on file  . Food insecurity - worry: Not on file  . Food insecurity - inability: Not on file  . Transportation needs - medical: Not on file  . Transportation needs -  non-medical: Not on file  Occupational History  . Not on file  Tobacco Use  . Smoking status: Never Smoker  . Smokeless tobacco: Never Used  Substance and Sexual Activity  . Alcohol use: Yes    Alcohol/week: 0.0 oz    Comment: 12/18/2015 "might have a glass of wine 2-3 times/month"  . Drug use: No  . Sexual activity: Yes    Birth control/protection: Condom  Other Topics Concern  . Not on file  Social History Narrative  . Not on file    Family History: Family History  Problem Relation Age of Onset  . Breast cancer Mother   . Hypertension Father   . Atrial fibrillation  Brother     Review of Systems: All other systems reviewed and are otherwise negative except as noted above.   Physical Exam: VS:  BP 116/80   Pulse 62   Ht '5\' 4"'$  (1.626 m)   Wt 153 lb (69.4 kg)   BMI 26.26 kg/m  , BMI Body Cortez index is 26.26 kg/m. Wt Readings from Last 3 Encounters:  03/11/17 153 lb (69.4 kg)  08/20/16 153 lb (69.4 kg)  08/13/16 154 lb (69.9 kg)    GEN- The patient is well appearing, alert and oriented x 3 today.   HEENT: normocephalic, atraumatic; sclera clear, conjunctiva pink; hearing intact; oropharynx clear; neck supple  Lungs- Clear to ausculation bilaterally, normal work of breathing.  No wheezes, rales, rhonchi Heart- Regular rate and rhythm  GI- soft, non-tender, non-distended, bowel sounds present  Extremities- no clubbing, cyanosis, or edema; DP/PT/radial pulses 2+ bilaterally MS- no significant deformity or atrophy Skin- warm and dry, no rash or lesion  Psych- euthymic mood, full affect Neuro- strength and sensation are intact   EKG:  EKG is ordered today. EKG today shows sinus rhythm, rate 66, PR 151mec, QRS 832mc, QTc 41362m  Recent Labs: 08/20/2016: TSH 2.34    Other studies Reviewed: Additional studies/ records that were reviewed today include: Dr AllJackalyn Lombardfice notes, AF clinic notes  Assessment and Plan: 1.  Paroxysmal atrial fibrillation Burden increasing lately CHADS2VASC is 1 (female), no indication for [facility] Centerhe prefers to continue baby ASA Extra prn BB not effective recently  Start Flecainide '50mg'$  twice daily today EKG in 2 weeks with AF clinic   Current medicines are reviewed at length with the patient today.   The patient does not have concerns regarding her medicines.  The following changes were made today:  Start Flecainide '50mg'$  twice daily   Labs/ tests ordered today include: none Orders Placed This Encounter  Procedures  . EKG 12-Lead     Disposition:   Follow up with me in 3 months, EKG in 2 weeks with AF  clinic    Signed, AmbChanetta MarshallP 03/11/2017 9:22 AM   [facility] [address] [phone]) ([phone])

## 2017-03-11 ENCOUNTER — Ambulatory Visit: Payer: BC Managed Care – PPO | Admitting: Nurse Practitioner

## 2017-03-11 ENCOUNTER — Encounter: Payer: Self-pay | Admitting: Nurse Practitioner

## 2017-03-11 ENCOUNTER — Telehealth: Payer: Self-pay | Admitting: Nurse Practitioner

## 2017-03-11 VITALS — BP 116/80 | HR 62 | Ht 64.0 in | Wt 153.0 lb

## 2017-03-11 DIAGNOSIS — I480 Paroxysmal atrial fibrillation: Secondary | ICD-10-CM | POA: Diagnosis not present

## 2017-03-11 MED ORDER — FLECAINIDE ACETATE 50 MG PO TABS: 50.0000 mg | ORAL_TABLET | Freq: Two times a day (BID) | ORAL | 3 refills | Status: DC

## 2017-03-11 NOTE — Telephone Encounter (Signed)
Called patient back. Patient wanted to know what she should do if she goes back into A. FIB. Informed her that she should take medications as prescribed. Patient was seen today and has a baseline EKG. Encouraged patient to call the office if she has any episodes of A. FIB before her appt with Rudi Cocoonna Carroll NP. Patient has appointment with A. FIB clinic in 2 weeks to check EKG. Will forward to Gypsy BalsamAmber Seiler NP for further advisement.  Patient verbalized understanding.

## 2017-03-11 NOTE — Telephone Encounter (Signed)
New message     Pt c/o medication issue:  1. Name of Medication: flecainide (TAMBOCOR) 50 MG tablet  2. How are you currently taking this medication (dosage and times per day)? Take 1 tablet (50 mg total) by mouth 2 (two) times daily.  3. Are you having a reaction (difficulty breathing--STAT)? NO  4. What is your medication issue? Patient wants to confirm she can take Flecainide while taking Paxil

## 2017-03-11 NOTE — Telephone Encounter (Signed)
Follow up    Patient is calling back about her medication. She wants to confirm she can take the Flecainide while taking the Paxil. Please call

## 2017-03-11 NOTE — Patient Instructions (Addendum)
Medication Instructions:   START FLECAINIDE 50 MG TWICE A DAY     If you need a refill on your cardiac medications before your next appointment, please call your pharmacy.  Labwork: NONE ORDERED  TODAY    Testing/Procedures: NONE ORDERED  TODAY    Follow-Up:  IN 3 MONTHS WITH [facility]   AND AFIB CLINIC  AS SCHEDULED   Any Other Special Instructions Will Be Listed Below (If Applicable).

## 2017-03-13 NOTE — Telephone Encounter (Signed)
Agree-  Ok to take 1 extra metoprolol for AF episode if needed but no more than that  Gypsy BalsamAmber Leslye Puccini, NP 03/13/2017 8:34 AM

## 2017-03-14 NOTE — Telephone Encounter (Signed)
Attempted to call the pt back to endorse instructions per Gypsy BalsamAmber Seiler NP, and no answer with no VM set-up.

## 2017-03-14 NOTE — Telephone Encounter (Signed)
Left a message for the pt to call the office back and ask to speak with a triage Nurse.

## 2017-03-15 NOTE — Telephone Encounter (Signed)
Left message to c/b to discuss medication instructions

## 2017-03-17 NOTE — Telephone Encounter (Signed)
Left detailed message from Gypsy BalsamAmber Seiler, NP on patient's voice mail and advised her to call back with questions or concerns.

## 2017-03-24 ENCOUNTER — Ambulatory Visit (HOSPITAL_COMMUNITY)
Admission: RE | Admit: 2017-03-24 | Discharge: 2017-03-24 | Disposition: A | Discharge status: Home / Self Care | Payer: BC Managed Care – PPO | Source: Ambulatory Visit | Attending: Nurse Practitioner | Admitting: Nurse Practitioner

## 2017-03-24 ENCOUNTER — Encounter (HOSPITAL_COMMUNITY): Payer: Self-pay | Admitting: Nurse Practitioner

## 2017-03-24 DIAGNOSIS — I480 Paroxysmal atrial fibrillation: Secondary | ICD-10-CM | POA: Diagnosis present

## 2017-03-24 DIAGNOSIS — Z79899 Other long term (current) drug therapy: Secondary | ICD-10-CM | POA: Insufficient documentation

## 2017-03-24 NOTE — Progress Notes (Signed)
Pt in for EKG since starting flecainide.  To be reviewed by Donna Carroll, NP 

## 2017-03-24 NOTE — Progress Notes (Signed)
Pt in for EKG on flecainide 50 mg bid ,started over the last two weeks by Gypsy BalsamAmber Seiler, NP. She has noted less flutters on the med. Tolerating well. EKG shows SR with pr int 186 ms, qrs int 70 ms, qtc 420 ms,. She has plans for f/u with Amber in 3 months

## 2017-04-01 ENCOUNTER — Ambulatory Visit: Payer: BC Managed Care – PPO | Admitting: Physician Assistant

## 2017-04-14 ENCOUNTER — Other Ambulatory Visit (HOSPITAL_COMMUNITY): Payer: Self-pay | Admitting: Orthopedic Surgery

## 2017-04-14 DIAGNOSIS — M79604 Pain in right leg: Secondary | ICD-10-CM

## 2017-04-14 DIAGNOSIS — M7989 Other specified soft tissue disorders: Secondary | ICD-10-CM

## 2017-04-15 ENCOUNTER — Ambulatory Visit (HOSPITAL_COMMUNITY)
Admission: RE | Admit: 2017-04-15 | Discharge: 2017-04-15 | Disposition: A | Discharge status: Home / Self Care | Payer: BC Managed Care – PPO | Source: Ambulatory Visit | Attending: Orthopedic Surgery | Admitting: Orthopedic Surgery

## 2017-04-15 DIAGNOSIS — M7989 Other specified soft tissue disorders: Secondary | ICD-10-CM

## 2017-04-15 DIAGNOSIS — I7789 Other specified disorders of arteries and arterioles: Secondary | ICD-10-CM | POA: Insufficient documentation

## 2017-04-15 DIAGNOSIS — M79604 Pain in right leg: Secondary | ICD-10-CM | POA: Diagnosis not present

## 2017-04-15 NOTE — Progress Notes (Signed)
RLE venous duplex prelim: negative for DVT. Small baker's cyst noted. Farrel DemarkJill Eunice, RDMS, RVT [facility]

## 2017-05-13 ENCOUNTER — Telehealth: Payer: Self-pay

## 2017-05-13 NOTE — Telephone Encounter (Signed)
   Primary Cardiologist: Dr. Johney FrameAllred  Chart reviewed as part of pre-operative protocol coverage. Given past medical history and time since last visit, based on ACC/AHA guidelines, Efraim KaufmannMelissa T Ace GinsBuck would be at acceptable risk for the planned procedure without further cardiovascular testing.   I will route this recommendation to the requesting party via Epic fax function and remove from pre-op pool.  Please call with questions.  Robbie LisBrittainy Simmons, PA-C 05/13/2017, 3:31 PM

## 2017-05-13 NOTE — Telephone Encounter (Signed)
   [facility] Pre-operative Risk Assessment    Request for surgical clearance:  1. What type of surgery is being performed? Right Carpal Tunnel Release   2. When is this surgery scheduled? TBD after clearance   3. What type of clearance is required (medical clearance vs. Pharmacy clearance to hold med vs. Both)? Both  4. Are there any medications that need to be held prior to surgery and how long? None listed   5. Practice name and name of physician performing surgery? [facility] - Dr. Earlie Server   6. What is your office phone number [phone]    7.   What is your office fax number [phone] attn: Kelly  8.   Anesthesia type (None, local, MAC, general) ? None listed   Jenna Cortez 05/13/2017, 8:35 AM  _________________________________________________________________   (provider comments below)

## 2017-05-28 ENCOUNTER — Other Ambulatory Visit: Payer: Self-pay | Admitting: Nurse Practitioner

## 2017-05-31 NOTE — Telephone Encounter (Signed)
Outpatient Medication Detail    Disp Refills Start End   flecainide (TAMBOCOR) 50 MG tablet 60 tablet 3 03/11/2017    Sig - Route: Take 1 tablet (50 mg total) by mouth 2 (two) times daily. - Oral   Sent to pharmacy as: flecainide (TAMBOCOR) 50 MG tablet   E-Prescribing Status: Receipt confirmed by pharmacy (03/11/2017 9:19 AM EST)   Pharmacy   [id number] [place] - [address]

## 2017-07-06 ENCOUNTER — Other Ambulatory Visit: Payer: Self-pay | Admitting: Nurse Practitioner

## 2017-07-30 ENCOUNTER — Other Ambulatory Visit: Payer: Self-pay | Admitting: Internal Medicine

## 2017-08-26 ENCOUNTER — Encounter: Payer: BC Managed Care – PPO | Admitting: Obstetrics & Gynecology

## 2017-10-14 LAB — HM COLONOSCOPY

## 2017-10-20 ENCOUNTER — Encounter: Payer: Self-pay | Admitting: General Practice

## 2017-10-22 ENCOUNTER — Other Ambulatory Visit: Payer: Self-pay | Admitting: Obstetrics & Gynecology

## 2017-10-24 NOTE — Telephone Encounter (Signed)
Annual scheduled on 11/03/17

## 2017-11-03 ENCOUNTER — Encounter: Payer: BC Managed Care – PPO | Admitting: Obstetrics & Gynecology

## 2017-11-24 ENCOUNTER — Other Ambulatory Visit: Payer: Self-pay | Admitting: Obstetrics & Gynecology

## 2017-11-24 NOTE — Telephone Encounter (Signed)
Annual exam scheduled on 02/02/18

## 2018-01-23 ENCOUNTER — Telehealth: Payer: Self-pay

## 2018-01-23 NOTE — Telephone Encounter (Signed)
   [facility] Pre-operative Risk Assessment    Request for surgical clearance:  1. What type of surgery is being performed?  Right thumb trigger release   2. When is this surgery scheduled?  01/30/2018   3. What type of clearance is required (medical clearance vs. Pharmacy clearance to hold med vs. Both)?  medical  4. Are there any medications that need to be held prior to surgery and how long?    5. Practice name and name of physician performing surgery?  [facility]/ Dr French Ana   6. What is your office phone number [phone] ex [phone]    7.   What is your office fax number [phone]  8.   Anesthesia type (None, local, MAC, general) ?  choice   Frederik Schmidt 01/23/2018, 11:25 AM  _________________________________________________________________   (provider comments below)

## 2018-01-24 NOTE — Telephone Encounter (Signed)
   Primary Cardiologist: No primary care provider on file.  Chart reviewed as part of pre-operative protocol coverage. Patient was contacted 01/24/2018 in reference to pre-operative risk assessment for pending surgery as outlined below.  Jenna CornsMelissa T Yepez tells me that she has decided not proceed with the surgery at this time.   I will route this recommendation to the requesting party via Epic fax function and remove from pre-op pool.  Please call with questions.  Berton BonJanine Dillen Belmontes, NP 01/24/2018, 1:02 PM

## 2018-02-02 ENCOUNTER — Encounter: Payer: BC Managed Care – PPO | Admitting: Obstetrics & Gynecology

## 2018-02-02 DIAGNOSIS — Z0289 Encounter for other administrative examinations: Secondary | ICD-10-CM

## 2018-03-03 ENCOUNTER — Ambulatory Visit: Payer: BC Managed Care – PPO | Admitting: Nurse Practitioner

## 2018-03-13 ENCOUNTER — Other Ambulatory Visit: Payer: Self-pay | Admitting: Family Medicine

## 2018-03-13 ENCOUNTER — Other Ambulatory Visit: Payer: Self-pay | Admitting: Obstetrics & Gynecology

## 2018-03-13 DIAGNOSIS — Z1231 Encounter for screening mammogram for malignant neoplasm of breast: Secondary | ICD-10-CM

## 2018-03-19 ENCOUNTER — Other Ambulatory Visit: Payer: Self-pay | Admitting: Nurse Practitioner

## 2018-03-19 ENCOUNTER — Other Ambulatory Visit: Payer: Self-pay | Admitting: Obstetrics & Gynecology

## 2018-03-20 NOTE — Telephone Encounter (Signed)
annual exam scheduled on 04/24/18

## 2018-03-30 DIAGNOSIS — H6983 Other specified disorders of Eustachian tube, bilateral: Secondary | ICD-10-CM | POA: Insufficient documentation

## 2018-03-30 DIAGNOSIS — H6993 Unspecified Eustachian tube disorder, bilateral: Secondary | ICD-10-CM | POA: Insufficient documentation

## 2018-04-05 ENCOUNTER — Other Ambulatory Visit: Payer: Self-pay | Admitting: Obstetrics & Gynecology

## 2018-04-06 ENCOUNTER — Other Ambulatory Visit: Payer: Self-pay | Admitting: Obstetrics & Gynecology

## 2018-04-06 NOTE — Telephone Encounter (Signed)
annual exam schedule on 04/24/18

## 2018-04-07 ENCOUNTER — Ambulatory Visit: Payer: BC Managed Care – PPO

## 2018-04-07 MED ORDER — PAROXETINE HCL ER 25 MG PO TB24: ORAL_TABLET | ORAL | 0 refills | Status: DC

## 2018-04-07 NOTE — Addendum Note (Signed)
Addended by: Aura Camps on: 04/07/2018 10:03 AM   Modules accepted: Orders

## 2018-04-14 ENCOUNTER — Encounter: Payer: BC Managed Care – PPO | Admitting: Obstetrics & Gynecology

## 2018-04-24 ENCOUNTER — Ambulatory Visit: Payer: BC Managed Care – PPO | Admitting: Internal Medicine

## 2018-04-24 ENCOUNTER — Encounter: Payer: BC Managed Care – PPO | Admitting: Obstetrics & Gynecology

## 2018-05-01 ENCOUNTER — Other Ambulatory Visit: Payer: Self-pay | Admitting: Internal Medicine

## 2018-05-05 ENCOUNTER — Ambulatory Visit: Payer: BC Managed Care – PPO

## 2018-06-22 ENCOUNTER — Encounter: Payer: BC Managed Care – PPO | Admitting: Obstetrics & Gynecology

## 2018-07-06 ENCOUNTER — Other Ambulatory Visit: Payer: Self-pay | Admitting: Obstetrics & Gynecology

## 2018-07-07 ENCOUNTER — Telehealth: Payer: Self-pay | Admitting: Internal Medicine

## 2018-07-07 NOTE — Telephone Encounter (Signed)
New Message      Tresa Endo from [facility] is calling and is  Needing the pt to be seen by next week for cardiac clearance.  The pt is scheduled for surgery on  June 17th

## 2018-07-12 ENCOUNTER — Encounter: Payer: Self-pay | Admitting: Physician Assistant

## 2018-07-12 ENCOUNTER — Telehealth: Payer: Self-pay

## 2018-07-12 ENCOUNTER — Other Ambulatory Visit: Payer: BC Managed Care – PPO

## 2018-07-12 ENCOUNTER — Ambulatory Visit (INDEPENDENT_AMBULATORY_CARE_PROVIDER_SITE_OTHER): Payer: BC Managed Care – PPO | Admitting: Physician Assistant

## 2018-07-12 ENCOUNTER — Other Ambulatory Visit: Payer: Self-pay

## 2018-07-12 ENCOUNTER — Ambulatory Visit: Payer: Self-pay | Admitting: *Deleted

## 2018-07-12 VITALS — Temp 97.7°F

## 2018-07-12 DIAGNOSIS — Z20828 Contact with and (suspected) exposure to other viral communicable diseases: Secondary | ICD-10-CM | POA: Diagnosis not present

## 2018-07-12 DIAGNOSIS — Z20822 Contact with and (suspected) exposure to covid-19: Secondary | ICD-10-CM

## 2018-07-12 DIAGNOSIS — J029 Acute pharyngitis, unspecified: Secondary | ICD-10-CM

## 2018-07-12 NOTE — Telephone Encounter (Signed)
appointment has been scheduled

## 2018-07-12 NOTE — Telephone Encounter (Signed)
Pt called to request a covid-19 test. 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 Was around the patient on Saturday. She feels like she has a sore throat now. She can not go back to work until she is cleared with a negative result. Pt advised of having a virtual appointment and then scheduled for the testing. Pt voiced understanding. Notified flow at [facility]  for an appointment. Call transferred to the practice. Reason for Disposition . [1] COVID-19 EXPOSURE (Close Contact) within last 14 days AND [2] needs COVID-19 lab test to return to work AND [3] NO symptoms  Answer Assessment - Initial Assessment Questions 1. CLOSE CONTACT: "Who is the person with the confirmed or suspected COVID-19 infection that you were exposed to?"     Daughter's friend and a patient in the office 2. PLACE of CONTACT: "Where were you when you were exposed to COVID-19?" (e.g., home, school, medical waiting room; which city?)      In the office and around the house 3. TYPE of CONTACT: "How much contact was there?" (e.g., sitting next to, live in same house, work in same office, same building)     Working with the patient, oral surgery 4. DURATION of CONTACT: "How long were you in contact with the COVID-19 patient?" (e.g., a few seconds, passed by person, a few minutes, live with the patient)     work 5. DATE of CONTACT: "When did you have contact with a COVID-19 patient?" (e.g., how many days ago)    saturday 6. TRAVEL: "Have you traveled out of the country recently?" If so, "When and where?"     * Also ask about out-of-state travel, since the CDC has identified some high-risk cities for community spread in the [place].     * Note: Travel becomes less relevant if there is widespread community transmission where the patient lives.     no 7. COMMUNITY SPREAD: "Are there lots of cases of COVID-19  (community spread) where you live?" (See public health department website, if unsure)       community  8. SYMPTOMS: "Do you have any symptoms?" (e.g., fever, cough, breathing difficulty)     Sore throat 9. PREGNANCY OR POSTPARTUM: "Is there any chance you are pregnant?" "When was your last menstrual period?" "Did you deliver in the last 2 weeks?"     No periods. Last one in 2016 10. HIGH RISK: "Do you have any heart or lung problems? Do you have a weak immune system?" (e.g., CHF, COPD, asthma, HIV positive, chemotherapy, renal failure, diabetes mellitus, sickle cell anemia)       afib  Protocols used: CORONAVIRUS (COVID-19) EXPOSURE-A-AH

## 2018-07-12 NOTE — Progress Notes (Signed)
I have discussed the procedure for the virtual visit with the patient who has given consent to proceed with assessment and treatment.   Marielys Trinidad S Xavi Tomasik, CMA     

## 2018-07-12 NOTE — Progress Notes (Signed)
Virtual Visit via Video   I connected with patient on 07/12/18 at 11:20 AM EDT by a video enabled telemedicine application and verified that I am speaking with the correct person using two identifiers.  Location patient: [place] [facility] Persons participating in the virtual visit: Patient, Provider, CMA (Patina Moore)  I discussed the limitations of evaluation and management by telemedicine and the availability of in person appointments. The patient expressed understanding and agreed to proceed.  Subjective:   HPI:   Patient presents via Doxy.Me today for assessment of potential COVID. Patient endorses sore throat and headache with onset yesterday evening. Denies fever, chills or aches. Denies cough or chest congestion. Denies SOB. Denies N/V/D. Daughter -- with symptoms -- sore throat. Getting tested today. Notes her daughters friend was with them all weekend and feeling well. Tested positive for COVID today. 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    ROS:   See pertinent positives and negatives per HPI.  Patient Active Problem List   Diagnosis Date Noted  . Anemia   . Hypotension 12/18/2015  . Acute kidney injury (HCC) 12/18/2015  . Nausea and vomiting 12/18/2015  . Chest pain 12/18/2015  . Anxiety   . Dehydration   . Depression 08/08/2015  . Acute maxillary sinusitis 02/14/2015  . Atrial tachycardia (HCC) 08/26/2013  . Palpitations 08/26/2013  . Acute bronchitis 06/15/2013  . Atrial fibrillation with RVR (HCC)   . DEPRESSION 11/15/2008  . ALLERGIC RHINITIS 11/15/2008  . FATIGUE 11/15/2008  . HEADACHE 11/15/2008  . 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 COLONIC POLYPS, HX OF 11/15/2008    Social History   Tobacco Use  . Smoking status: Never Smoker  . Smokeless tobacco: Never Used   Substance Use Topics  . Alcohol use: Yes    Alcohol/week: 0.0 standard drinks    Comment: 12/18/2015 "might have a glass of wine 2-3 times/month"    Current Outpatient Medications:  .  aspirin EC 81 MG tablet, Take 81 mg by mouth daily., Disp: , Rfl:  .  Estradiol 10 MCG TABS vaginal tablet, Place 1 tablet vaginally daily for 2 weeks, then 1 tablet vaginally two times a week., Disp: 24 tablet, Rfl: 4 .  flecainide (TAMBOCOR) 50 MG tablet, TAKE 1 TABLET BY MOUTH TWICE A DAY, Disp: 30 tablet, Rfl: 0 .  fluticasone (FLONASE) 50 MCG/ACT nasal spray, Place 2 sprays into both nostrils daily as needed for allergies. Reported on 02/14/2015, Disp: 16 g, Rfl: 0 .  Ibuprofen-Famotidine (DUEXIS) 800-26.6 MG TABS, Take 1 tablet by mouth daily., Disp: , Rfl:  .  metoprolol succinate (TOPROL-XL) 25 MG 24 hr tablet, TAKE 1/2 TABLET BY MOUTH DAILY., Disp: 45 tablet, Rfl: 2 .  PARoxetine (PAXIL-CR) 25 MG 24 hr tablet, TAKE 1 TABLET BY MOUTH EVERY DAY IN THE MORNING, Disp: 30 tablet, Rfl: 0  Allergies  Allergen Reactions  . Codeine Itching  . Sulfonamide Derivatives Rash    Objective:   Temp 97.7 F (36.5 C) (Oral)   Patient is well-developed, well-nourished in no acute distress.  Resting comfortably at home.  Head is normocephalic, atraumatic.  No labored breathing.  Speech is clear and coherent with logical contest.  Patient is alert and oriented at baseline.   Assessment and Plan:   1. Sore throat 2. Exposure to  Covid-19 Virus Will send to [facility] for testing giving exposure and current symptoms. Supportive measures and OTC medications reviewed. She is to quarantine home until test results are in. Her daughter is also symptomatic and being tested by pediatrician. Her employer has been made aware this morning.    Leeanne Rio, PA-C 07/12/2018

## 2018-07-12 NOTE — Telephone Encounter (Signed)
Pt. Called to schedule testing for COVID 19 due to recent known exposure.   Appt. Scheduled at [facility] test site at 1:30 PM.  Advised to wear mask, and remain in car for testing.  Verb. Understanding.    Leonidas Romberg, CMA  [facility] Testing Pool        Name:Charman [place]  DOB: 04-15-1965  EQA:[id number]  Reason for testing: Exposure to COVID-19  Insurance:[employer] PP  Policy#: [id number]   Valid    Ordering/Resulting PCP: Brunetta Jeans, PA-C

## 2018-07-13 LAB — NOVEL CORONAVIRUS, NAA: SARS-CoV-2, NAA: NOT DETECTED

## 2018-07-17 ENCOUNTER — Telehealth: Payer: BC Managed Care – PPO | Admitting: Internal Medicine

## 2018-07-19 ENCOUNTER — Ambulatory Visit (INDEPENDENT_AMBULATORY_CARE_PROVIDER_SITE_OTHER): Payer: BC Managed Care – PPO | Admitting: Physician Assistant

## 2018-07-19 ENCOUNTER — Encounter: Payer: Self-pay | Admitting: Physician Assistant

## 2018-07-19 ENCOUNTER — Other Ambulatory Visit: Payer: Self-pay

## 2018-07-19 ENCOUNTER — Ambulatory Visit: Payer: Self-pay | Admitting: *Deleted

## 2018-07-19 VITALS — Temp 97.0°F | Ht 63.0 in | Wt 155.0 lb

## 2018-07-19 DIAGNOSIS — Z20828 Contact with and (suspected) exposure to other viral communicable diseases: Secondary | ICD-10-CM

## 2018-07-19 DIAGNOSIS — Z20822 Contact with and (suspected) exposure to covid-19: Secondary | ICD-10-CM

## 2018-07-19 NOTE — Telephone Encounter (Signed)
Patient has appointment today with [facility] for evaluation

## 2018-07-19 NOTE — Progress Notes (Signed)
I have discussed the procedure for the virtual visit with the patient who has given consent to proceed with assessment and treatment.   Stefania Goulart S Druanne Bosques, CMA     

## 2018-07-19 NOTE — Telephone Encounter (Signed)
Pt called back and states that her step daughter was exposed to her mother who tested positive for COVID on 07/18/2018; the step daughter was last with mom on 07/16/2018; the pt denies SOB, fever, or cough; recommendations made per nurse triage protocol; she verbalized understanding; pt transferred to [facility] for scheduling.  Reason for Disposition . [1] COVID-19 EXPOSURE (Close Contact) AND [2] within last 14 days BUT [3] NO symptoms  Answer Assessment - Initial Assessment Questions 1. CLOSE CONTACT: "Who is the person with the confirmed or suspected COVID-19 infection that you were exposed to?"     Pt's step daughter 2. PLACE of CONTACT: "Where were you when you were exposed to COVID-19?" (e.g., home, school, medical waiting room; which city?)    home 3. TYPE of CONTACT: "How much contact was there?" (e.g., sitting next to, live in same house, work in same office, same building)     Lives in same home 4. DURATION of CONTACT: "How long were you in contact with the COVID-19 patient?" (e.g., a few seconds, passed by person, a few minutes, live with the patient)     Lives in same home 5. DATE of CONTACT: "When did you have contact with a COVID-19 patient?" (e.g., how many days ago)    Since 07/16/2018 6. TRAVEL: "Have you traveled out of the country recently?" If so, "When and where?"     * Also ask about out-of-state travel, since the CDC has identified some high-risk cities for community spread in the [place].     * Note: Travel becomes less relevant if there is widespread community transmission where the patient lives.      7. COMMUNITY SPREAD: "Are there lots of cases of COVID-19 (community spread) where you live?" (See public health department website, if unsure)       major 8. SYMPTOMS: "Do you have any symptoms?" (e.g., fever, cough, breathing difficulty)     no 9. PREGNANCY OR POSTPARTUM: "Is there any chance you are pregnant?" "When was your last menstrual period?" "Did you  deliver in the last 2 weeks?"    no 10. HIGH RISK: "Do you have any heart or lung problems? Do you have a weak immune system?" (e.g., CHF, COPD, asthma, HIV positive, chemotherapy, renal failure, diabetes mellitus, sickle cell anemia)  Protocols used: CORONAVIRUS (COVID-19) EXPOSURE-A-AH

## 2018-07-19 NOTE — Patient Instructions (Signed)
Instructions sent to MyChart.  Hang in there!  We will have you self quarantine at home until your daughter's results are in. If positive then we can assume you likely have COVID and will need you to quarantine for 14 days. If negative then we will decide on testing for you.  Please make sure to restart Flonase and a daily antihistamine for allergy symptoms. Please let us know ASAP if you note any symptoms. Also let us know your daughter's results as soon as you know them!

## 2018-07-19 NOTE — Progress Notes (Signed)
Virtual Visit via Video   I connected with patient on 07/19/18 at 11:30 AM EDT by a video enabled telemedicine application and verified that I am speaking with the correct person using two identifiers.  Location patient: Home Location provider: [facility], [facility] Persons participating in the virtual visit: Patient, Provider, PA-Student CMA (Patina Moore)  I discussed the limitations of evaluation and management by telemedicine and the availability of in person appointments. The patient expressed understanding and agreed to proceed.  Subjective:   HPI:   Patient presents today via Doxy.Me to discuss COVID exposure. Patient endorses that her step daughters recently went to the [place] with their mother this past week. Notes they returned to the area on Saturday, the youngest step daughter coming to stay with them for a couple of weeks. Note the day after returning, mother developed COVID symptoms, was evaluated and has a + test result. Notes the oldest step daughter who she has not been around is now also symptomatic and COVID +. The youngest daughter staying with them is asymptomatic but due to significant exposure is presumed + and has been sent for testing this morning. Patient notes some symptoms of her usual seasonal allergies - rhinorrhea, scratchy throat that have been intermittent over the past couple of months.  Denies fever, cough, congestion. Denies GI symptoms or any other symptoms to indicate active COVID symptoms.  ROS:  See pertinent positives and negatives per HPI.  Patient Active Problem List   Diagnosis Date Noted  . Anemia   . Hypotension 12/18/2015  . Acute kidney injury ([place]) 12/18/2015  . Nausea and vomiting 12/18/2015  . Chest pain 12/18/2015  . Anxiety   . Dehydration   . Depression 08/08/2015  . Acute maxillary sinusitis 02/14/2015  . Atrial tachycardia ([place]) 08/26/2013  . Palpitations 08/26/2013  . Acute bronchitis 06/15/2013  . Atrial  fibrillation with RVR ([place])   . DEPRESSION 11/15/2008  . ALLERGIC RHINITIS 11/15/2008  . FATIGUE 11/15/2008  . HEADACHE 11/15/2008  . 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 COLONIC POLYPS, HX OF 11/15/2008    Social History   Tobacco Use  . Smoking status: Never Smoker  . Smokeless tobacco: Never Used  Substance Use Topics  . Alcohol use: Yes    Alcohol/week: 0.0 standard drinks    Comment: 12/18/2015 "might have a glass of wine 2-3 times/month"    Current Outpatient Medications:  .  aspirin EC 81 MG tablet, Take 81 mg by mouth daily., Disp: , Rfl:  .  Estradiol 10 MCG TABS vaginal tablet, Place 1 tablet vaginally daily for 2 weeks, then 1 tablet vaginally two times a week., Disp: 24 tablet, Rfl: 4 .  flecainide (TAMBOCOR) 50 MG tablet, TAKE 1 TABLET BY MOUTH TWICE A DAY, Disp: 30 tablet, Rfl: 0 .  fluticasone (FLONASE) 50 MCG/ACT nasal spray, Place 2 sprays into both nostrils daily as needed for allergies. Reported on 02/14/2015, Disp: 16 g, Rfl: 0 .  Ibuprofen-Famotidine (DUEXIS) 800-26.6 MG TABS, Take 1 tablet by mouth daily., Disp: , Rfl:  .  metoprolol succinate (TOPROL-XL) 25 MG 24 hr tablet, TAKE 1/2 TABLET BY MOUTH DAILY., Disp: 45 tablet, Rfl: 2 .  PARoxetine (PAXIL-CR) 25 MG 24 hr tablet, TAKE 1 TABLET BY MOUTH EVERY DAY IN THE MORNING, Disp: 30 tablet, Rfl: 0  Allergies  Allergen Reactions  . Codeine Itching  . Sulfonamide Derivatives Rash    Objective:   Temp (!) 97 F (36.1 C) (Oral)   Ht 5'  3" (1.6 m)   Wt 155 lb (70.3 kg)   BMI 27.46 kg/m   Patient is well-developed, well-nourished in no acute distress.  Resting comfortably at home.  Head is normocephalic, atraumatic.  No labored breathing.  Speech is clear and coherent with logical contest.  Patient is alert and oriented at baseline.   Assessment and Plan:   1. Exposure to Covid-19 Virus Discussed she needs to quarantine herself at home for now, until daughters results come in. If + it would be  unlikely that she would not also have the virus and should remain home for 14 days if remaining asymptomatic. If daughters test is negative still feel reasonable to test patient later this week or Monday of next week as takes a few days after contracting virus for viral dna to replicate enough for active testing. She is to monitor closely for any developing symptoms of COVID and report them ASAP.    Piedad ClimesWilliam Cody Jovonni Borquez, PA-C 07/19/2018

## 2018-07-20 ENCOUNTER — Encounter: Payer: Self-pay | Admitting: Physician Assistant

## 2018-07-20 ENCOUNTER — Other Ambulatory Visit: Payer: Self-pay | Admitting: Obstetrics & Gynecology

## 2018-07-20 IMAGING — CR DG CHEST 2V
2 series · 2 of 2 positions shown · non-contrast
Comparison: July 30, 2014

CLINICAL DATA: Atrial fibrillation.  Nausea.  Chest pressure

EXAM:
CHEST  2 VIEW

[chest pa]
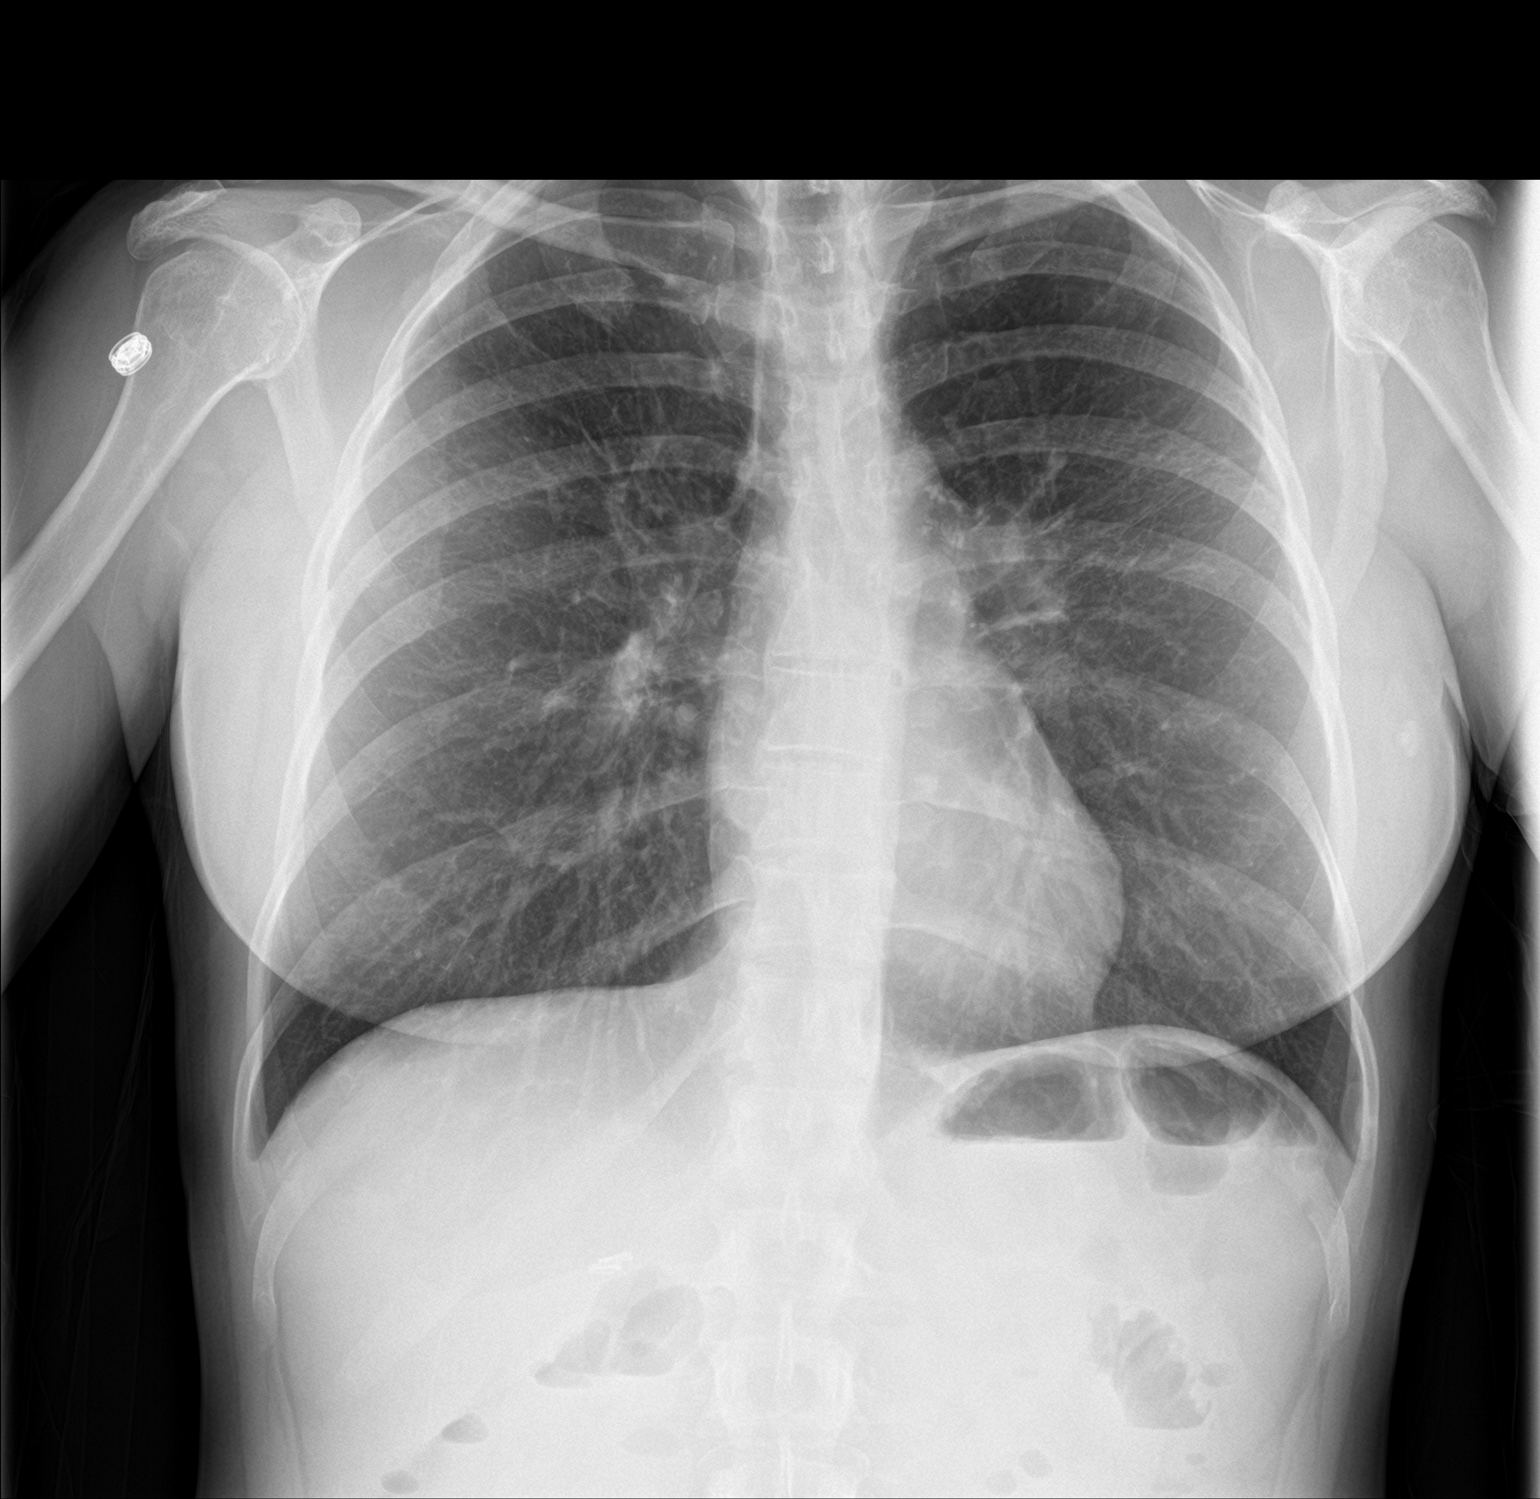

[chest lat]
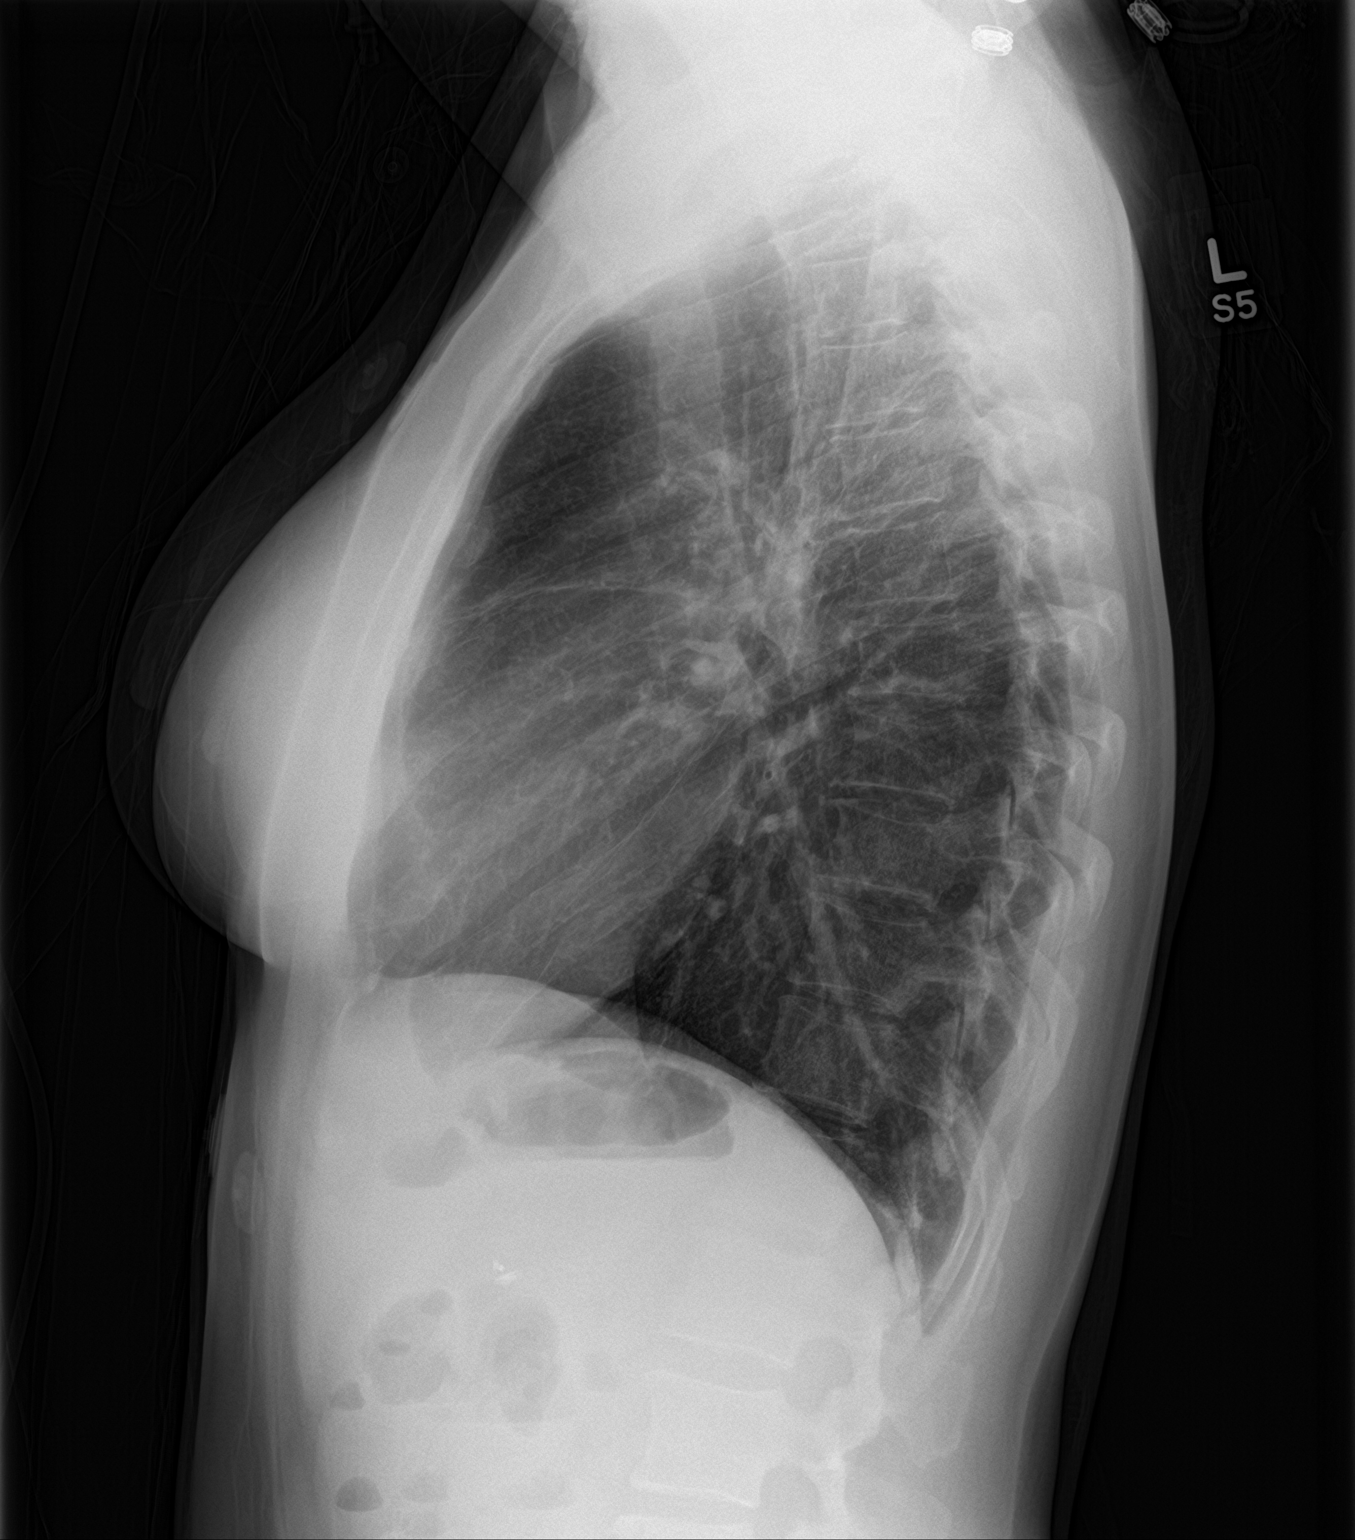

[2 of 2 positions shown; findings below may reference images not displayed]

FINDINGS: There is no edema or consolidation. There is mild stable scarring in
the left upper lobe. The heart size and pulmonary vascularity are
normal. No adenopathy. There is mid thoracic levoscoliosis, mild.
IMPRESSION: Scarring left upper lobe. No edema or consolidation. Stable cardiac
silhouette.

## 2018-07-20 MED ORDER — PAROXETINE HCL ER 25 MG PO TB24: 25.0000 mg | ORAL_TABLET | Freq: Every day | ORAL | 0 refills | Status: DC

## 2018-07-20 NOTE — Addendum Note (Signed)
Addended by: Thamas Jaegers on: 07/20/2018 11:36 AM   Modules accepted: Orders

## 2018-07-21 ENCOUNTER — Encounter: Payer: Self-pay | Admitting: Physician Assistant

## 2018-07-24 ENCOUNTER — Encounter: Payer: Self-pay | Admitting: Physician Assistant

## 2018-07-24 ENCOUNTER — Telehealth (INDEPENDENT_AMBULATORY_CARE_PROVIDER_SITE_OTHER): Payer: BC Managed Care – PPO | Admitting: Internal Medicine

## 2018-07-24 ENCOUNTER — Encounter: Payer: Self-pay | Admitting: Internal Medicine

## 2018-07-24 ENCOUNTER — Ambulatory Visit: Payer: BC Managed Care – PPO | Admitting: Internal Medicine

## 2018-07-24 VITALS — Ht 63.0 in | Wt 154.0 lb

## 2018-07-24 DIAGNOSIS — I480 Paroxysmal atrial fibrillation: Secondary | ICD-10-CM | POA: Diagnosis not present

## 2018-07-24 NOTE — Progress Notes (Signed)
Electrophysiology TeleHealth Note   Due to national recommendations of social distancing due to COVID 19, an audio/video telehealth visit is felt to be most appropriate for this patient at this time.  See MyChart message from today for the patient's consent to telehealth for [facility]   Date:  07/24/2018   ID:  Jenna Cortez, DOB 10-Jul-1965, MRN [id number]  Location: patient's home  Provider location:  [facility]  Evaluation Performed: Follow-up visit  PCP:  Midge Minium, MD   Electrophysiologist:  Dr Rayann Heman  Chief Complaint:  palpitations  History of Present Illness:    Jenna Cortez is a 53 y.o. female who presents via telehealth conferencing today.  Since last being seen in our clinic, the patient reports doing very well.  She is exercising regularly without symptoms.  Her AF is well controlled.  Today, she denies symptoms of palpitations, chest pain, shortness of breath,  lower extremity edema, dizziness, presyncope, or syncope.  The patient is otherwise without complaint today.  The patient denies symptoms of fevers, chills, cough, or new SOB worrisome for COVID 19.  Past Medical History:  Diagnosis Date  . Anxiety   . BRCA1 negative    NEGATIVE MUTATION 01/05/06  . BRCA2 negative    NEGATIVE MUTATION 01/05/06  . Depression   . Dysplasia of cervix, unspecified   . Herpes   . Hyperprolactinemia ([place])   . Insomnia   . Migraine    "2-3/year" (12/18/2015)  . Paroxysmal atrial fibrillation (HCC)   . TMJ (dislocation of temporomandibular joint)     Past Surgical History:  Procedure Laterality Date  . AUGMENTATION MAMMAPLASTY Bilateral 1998  . BREAST BIOPSY Bilateral 2001-2003   RIGHT BREAST BIOPSY 2001-PAPILOMA/LT BR BXY-BENIGN 2003  . CERVIX LESION DESTRUCTION  ~ 1993  . CLAVICLE HARDWARE REMOVAL Right ~ 2008  . EYE SURGERY    . FRACTURE SURGERY    . HYSTEROSCOPY N/A 07/27/2013   Procedure: HYSTEROSCOPY WITH HYDROTHERMAL ABLATION;  Surgeon: Terrance Mass, MD;  Location: [facility];  Service: Gynecology;  Laterality: N/A;  . LAPAROSCOPIC CHOLECYSTECTOMY  2011  . ORIF CLAVICLE FRACTURE Right ~ 2008   CLAVICLE FRACTURE-TRAUMATIC  . REFRACTIVE SURGERY Bilateral 1990s  . SHOULDER ARTHROSCOPY Right 2016   "labrium cartilage repair"  . SHOULDER ARTHROSCOPY WITH ROTATOR CUFF REPAIR Right 2000    Current Outpatient Medications  Medication Sig Dispense Refill  . aspirin EC 81 MG tablet Take 81 mg by mouth daily.    . flecainide (TAMBOCOR) 50 MG tablet TAKE 1 TABLET BY MOUTH TWICE A DAY 30 tablet 0  . Ibuprofen-Famotidine (DUEXIS) 800-26.6 MG TABS Take 1 tablet by mouth as needed (pain).     . metoprolol succinate (TOPROL-XL) 25 MG 24 hr tablet TAKE 1/2 TABLET BY MOUTH DAILY. 45 tablet 2  . PARoxetine (PAXIL-CR) 25 MG 24 hr tablet Take 1 tablet (25 mg total) by mouth daily. 30 tablet 0  . fluticasone (FLONASE) 50 MCG/ACT nasal spray Place 2 sprays into both nostrils daily as needed for allergies. Reported on 02/14/2015 16 g 0   No current facility-administered medications for this visit.     Allergies:   Codeine and Sulfonamide derivatives   Social History:  The patient  reports that she has never smoked. She has never used smokeless tobacco. She reports current alcohol use. She reports that she does not use drugs.   Family History:  The patient's family history includes Atrial fibrillation in her brother; Breast cancer  in her mother; Hypertension in her father.   ROS:  Please see the history of present illness.   All other systems are personally reviewed and negative.    Exam:    Vital Signs:  Ht _0  (1.6 m)   Wt 154 lb (69.9 kg)   BMI 27.28 kg/m   Well appearing, NAD, OP clear   Labs/Other Tests and Data Reviewed:    Recent Labs: No results found for requested labs within last 8760 hours.   Wt Readings from Last 3 Encounters:  07/24/18 154 lb (69.9 kg)  07/19/18 155 lb (70.3 kg)  03/11/17 153 lb (69.4 kg)     ASSESSMENT & PLAN:    1.  paroxysmal atrial fibrillation Well controlled chads2vasc score is 1.  No indication for [facility] therapy  2. preop Ok to proceed with hand surgery if medically indicated without further CV testing   Follow-up:  AF clinic in a year   Patient Risk:  after full review of this patients clinical status, I feel that they are at moderate risk at this time.  Today, I have spent 15 minutes with the patient with telehealth technology discussing arrhythmia management .    Army Fossa, MD  07/24/2018 2:53 PM     [place] [address] [place] [address] [phone] (office) [phone] (fax)

## 2018-07-25 ENCOUNTER — Other Ambulatory Visit: Payer: Self-pay

## 2018-07-25 MED ORDER — METOPROLOL SUCCINATE ER 25 MG PO TB24: 12.5000 mg | ORAL_TABLET | Freq: Every day | ORAL | 3 refills | Status: DC

## 2018-07-25 MED ORDER — FLECAINIDE ACETATE 50 MG PO TABS: 50.0000 mg | ORAL_TABLET | Freq: Two times a day (BID) | ORAL | 3 refills | Status: DC

## 2018-07-28 ENCOUNTER — Telehealth: Payer: Self-pay | Admitting: Family Medicine

## 2018-07-28 ENCOUNTER — Encounter: Payer: BC Managed Care – PPO | Admitting: Obstetrics & Gynecology

## 2018-07-28 NOTE — Telephone Encounter (Signed)
°  Pt calling.  States that she needs to speak with Einar Pheasant.  States that she and spouse decided to [facility] because step daughter was exposed to her COVID positive mother.   Pt had surgery scheduled for 08/02/2018 Dr. Lorenz Coaster at the surgery center on [facility].  Pt states that now the surgery center is telling her because she was tested she must have quarantined since 06/22 for 14 days and they are putting off her surgery. Pt would like to see if Einar Pheasant see if there is any way he can advocate for her. Pt can be reached at [phone]

## 2018-07-28 NOTE — Telephone Encounter (Signed)
Unfortunately we have no say in the surgical center protocols.  They made these strict and uniform to protect their patients and staff.  Typically this involves pre-op COVID testing.  At this time, the best bet is to call Dr Lillia Corporal office and ask them how best to proceed.

## 2018-07-28 NOTE — Telephone Encounter (Signed)
Called patient and she said that the surgical center has agreed to do her surgery as long as her test is negative.  She is still waiting for results.

## 2018-07-28 NOTE — Telephone Encounter (Signed)
She is not my patient. I cannot change what surgical center is requiring so she will unfortunately have to follow their rules. Will route to PCP to see if she has any input.

## 2018-09-15 ENCOUNTER — Encounter: Payer: BC Managed Care – PPO | Admitting: Obstetrics & Gynecology

## 2018-10-25 ENCOUNTER — Encounter: Payer: Self-pay | Admitting: Gynecology

## 2018-11-05 ENCOUNTER — Other Ambulatory Visit: Payer: Self-pay | Admitting: Obstetrics & Gynecology

## 2018-11-20 ENCOUNTER — Ambulatory Visit
Admission: RE | Admit: 2018-11-20 | Discharge: 2018-11-20 | Disposition: A | Discharge status: Home / Self Care | Payer: BC Managed Care – PPO | Source: Ambulatory Visit | Attending: Obstetrics & Gynecology | Admitting: Obstetrics & Gynecology

## 2018-11-20 ENCOUNTER — Other Ambulatory Visit: Payer: Self-pay

## 2018-11-20 DIAGNOSIS — Z1231 Encounter for screening mammogram for malignant neoplasm of breast: Secondary | ICD-10-CM

## 2018-11-21 ENCOUNTER — Other Ambulatory Visit: Payer: Self-pay | Admitting: Obstetrics & Gynecology

## 2018-11-21 DIAGNOSIS — Z872 Personal history of diseases of the skin and subcutaneous tissue: Secondary | ICD-10-CM

## 2018-11-27 ENCOUNTER — Encounter: Payer: BC Managed Care – PPO | Admitting: Obstetrics & Gynecology

## 2018-12-08 ENCOUNTER — Ambulatory Visit
Admission: RE | Admit: 2018-12-08 | Discharge: 2018-12-08 | Disposition: A | Discharge status: Home / Self Care | Payer: BC Managed Care – PPO | Source: Ambulatory Visit | Attending: Obstetrics & Gynecology | Admitting: Obstetrics & Gynecology

## 2018-12-08 ENCOUNTER — Other Ambulatory Visit: Payer: Self-pay

## 2018-12-08 ENCOUNTER — Other Ambulatory Visit: Payer: BC Managed Care – PPO

## 2018-12-08 ENCOUNTER — Other Ambulatory Visit: Payer: Self-pay | Admitting: Obstetrics & Gynecology

## 2018-12-08 DIAGNOSIS — Z872 Personal history of diseases of the skin and subcutaneous tissue: Secondary | ICD-10-CM

## 2018-12-11 ENCOUNTER — Other Ambulatory Visit: Payer: Self-pay

## 2018-12-11 ENCOUNTER — Encounter: Payer: Self-pay | Admitting: Obstetrics & Gynecology

## 2018-12-11 ENCOUNTER — Ambulatory Visit: Payer: BC Managed Care – PPO | Admitting: Obstetrics & Gynecology

## 2018-12-11 VITALS — BP 124/78 | Ht 63.5 in | Wt 158.0 lb

## 2018-12-11 DIAGNOSIS — Z780 Asymptomatic menopausal state: Secondary | ICD-10-CM | POA: Diagnosis not present

## 2018-12-11 DIAGNOSIS — Z803 Family history of malignant neoplasm of breast: Secondary | ICD-10-CM

## 2018-12-11 DIAGNOSIS — F419 Anxiety disorder, unspecified: Secondary | ICD-10-CM

## 2018-12-11 DIAGNOSIS — Z01419 Encounter for gynecological examination (general) (routine) without abnormal findings: Secondary | ICD-10-CM | POA: Diagnosis not present

## 2018-12-11 NOTE — Patient Instructions (Signed)
  1. Encounter for routine gynecological examination with Papanicolaou smear of cervix Normal gynecologic exam in menopause.  Pap reflex done today.  Breast exam normal, status post bilateral implants.  Screening mammogram November 2020 was negative.  Colonoscopy in 2019.  Health labs with Dr. Birdie Riddle.  2. Postmenopause Well now on no hormone replacement therapy.  No postmenopausal bleeding.  Recommend vitamin D supplements, calcium intake of 1200 mg daily and regular weightbearing physical activities.  3. Family history of breast cancer in mother Patient is BRCA1 and BRCA2 negative.  4. Anxiety Possible ADD per patient's symptoms.  Recommend addressing with Fam MD Dr Birdie Riddle to be assessed by a psychologist.  Jenna Cortez, it was a pleasure seeing you today!  I will inform you of your results as soon as they are available.

## 2018-12-11 NOTE — Progress Notes (Signed)
Jenna Cortez Dec 27, 1965 [id number]   History:    53 y.o. G2P2L2 Married.  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  RP:  Established patient presenting for Annual/Gynecologic exam  HPI: Post menopause x2 years, well on no hormone replacement therapy.  No postmenopausal bleeding.  No pelvic pain.  No pain with intercourse.  Urine and bowel movements normal.  Breasts normal status post bilateral implants..  Mother with diagnosis of breast cancer at age 27.  Patient is BRCA1 and BRCA2 negative.  Body mass index 27.55.  Planning to exercise more regularly.  Patient on Paxil for anxiety.  Feels like may have ADD based on longstanding difficulty focusing and finishing a task.  Will address with Dr. Birdie Riddle to be evaluated.  Health labs with Dr. Birdie Riddle.  Postmenopause Pap test 08/2016 Negative, satisfactory, but absent TZ cells Mammo 12/2018 Negative Bone Density: Never Colonoscopy: 2019  Obstetric History OB History  Gravida Para Term Preterm AB Living  _0 SAB TAB Ectopic Multiple Live Births          2    # Outcome Date GA Lbr Len/2nd Weight Sex Delivery Anes PTL Lv  2 Term     M Vag-Spont  N LIV  1 Term     F Vag-Spont  N LIV     ROS: A ROS was performed and pertinent positives and negatives are included in the history.  GENERAL: No fevers or chills. HEENT: No change in vision, no earache, sore throat or sinus congestion. NECK: No pain or stiffness. CARDIOVASCULAR: No chest pain or pressure. No palpitations. PULMONARY: No shortness of breath, cough or wheeze. GASTROINTESTINAL: No abdominal pain, nausea, vomiting or diarrhea, melena or bright red blood per rectum. GENITOURINARY: No urinary frequency, urgency, hesitancy or dysuria. MUSCULOSKELETAL: No joint or muscle pain, no back pain, no recent trauma. DERMATOLOGIC: No rash, no itching, no lesions. ENDOCRINE: No polyuria, polydipsia, no heat or cold intolerance. No recent change in weight. HEMATOLOGICAL: No anemia or easy bruising or  bleeding. NEUROLOGIC: No headache, seizures, numbness, tingling or weakness. PSYCHIATRIC: No depression, no loss of interest in normal activity or change in sleep pattern.     Exam:   BP 124/78   Ht 5' 3.5" (1.613 m)   Wt 158 lb (71.7 kg)   BMI 27.55 kg/m   Body mass index is 27.55 kg/m.  General appearance : Well developed well nourished female. No acute distress HEENT: Eyes: no retinal hemorrhage or exudates,  Neck supple, trachea midline, no carotid bruits, no thyroidmegaly Lungs: Clear to auscultation, no rhonchi or wheezes, or rib retractions  Heart: Regular rate and rhythm, no murmurs or gallops Breast:Examined in sitting and supine position were symmetrical in appearance, status post bilateral implants, no palpable masses or tenderness,  no skin retraction, no nipple inversion, no nipple discharge, no skin discoloration, no axillary or supraclavicular lymphadenopathy Abdomen: no palpable masses or tenderness, no rebound or guarding Extremities: no edema or skin discoloration or tenderness  Pelvic: Vulva: Normal             Vagina: No gross lesions or discharge  Cervix: No gross lesions or discharge.  Pap reflex done.  Uterus  AV, normal size, shape and consistency, non-tender and mobile  Adnexa  Without masses or tenderness  Anus: Normal   Assessment/Plan:  53 y.o. female for annual exam   1. Encounter for routine gynecological examination with Papanicolaou smear of cervix Normal gynecologic  exam in menopause.  Pap reflex done today.  Breast exam normal, status post bilateral implants.  Screening mammogram November 2020 was negative.  Colonoscopy in 2019.  Health labs with Dr. Tabori.  2. Postmenopause Well now on no hormone replacement therapy.  No postmenopausal bleeding.  Recommend vitamin D supplements, calcium intake of 1200 mg daily and regular weightbearing physical activities.  3. Family history of breast cancer in mother Patient is BRCA1 and BRCA2 negative.   4. Anxiety Possible ADD per patient's symptoms.  Recommend addressing with Fam MD Dr Tabori to be assessed by a psychologist.  Jenna Lavoie MD, 4:18 PM 12/11/2018   

## 2018-12-11 NOTE — Addendum Note (Signed)
Addended by: Thurnell Garbe A on: 12/11/2018 04:51 PM   Modules accepted: Orders

## 2018-12-13 LAB — PAP IG W/ RFLX HPV ASCU

## 2018-12-15 ENCOUNTER — Encounter: Payer: Self-pay | Admitting: *Deleted

## 2019-03-23 ENCOUNTER — Other Ambulatory Visit: Payer: Self-pay | Admitting: Obstetrics & Gynecology

## 2019-03-23 NOTE — Telephone Encounter (Signed)
03/01/2017 visit note you wrote "Decision therefore to increase her antidepressant Paxil from 12.5 mg daily to 25 mg daily, hoping for some improvement in the vasomotor menopausal symptoms and improvement in her moods."

## 2019-05-11 ENCOUNTER — Telehealth: Payer: BC Managed Care – PPO | Admitting: Family Medicine

## 2019-05-28 ENCOUNTER — Telehealth: Payer: BC Managed Care – PPO | Admitting: Family Medicine

## 2019-06-01 ENCOUNTER — Other Ambulatory Visit: Payer: Self-pay

## 2019-06-01 ENCOUNTER — Encounter: Payer: Self-pay | Admitting: Family Medicine

## 2019-06-01 ENCOUNTER — Telehealth (INDEPENDENT_AMBULATORY_CARE_PROVIDER_SITE_OTHER): Payer: BC Managed Care – PPO | Admitting: Family Medicine

## 2019-06-01 ENCOUNTER — Telehealth: Payer: BC Managed Care – PPO | Admitting: Family Medicine

## 2019-06-01 ENCOUNTER — Telehealth: Payer: Self-pay | Admitting: General Practice

## 2019-06-01 DIAGNOSIS — F900 Attention-deficit hyperactivity disorder, predominantly inattentive type: Secondary | ICD-10-CM | POA: Diagnosis not present

## 2019-06-01 MED ORDER — METHYLPHENIDATE HCL ER (OSM) 18 MG PO TBCR: 18.0000 mg | EXTENDED_RELEASE_TABLET | Freq: Every day | ORAL | 0 refills | Status: DC

## 2019-06-01 NOTE — Telephone Encounter (Signed)
PA began in covermymeds for concerta today.

## 2019-06-01 NOTE — Progress Notes (Signed)
I have discussed the procedure for the virtual visit with the patient who has given consent to proceed with assessment and treatment.   Pt unable to obtain vitals.   Gitty Osterlund L Rosaisela Jamroz, CMA     

## 2019-06-01 NOTE — Progress Notes (Signed)
Virtual Visit via Video   I connected with patient on 06/01/19 at  9:00 AM EDT by a video enabled telemedicine application and verified that I am speaking with the correct person using two identifiers.  Location patient: Home Location provider: Acupuncturist, Office Persons participating in the virtual visit: Patient, Provider, [place] (Jess B)  I discussed the limitations of evaluation and management by telemedicine and the availability of in person appointments. The patient expressed understanding and agreed to proceed.  Subjective:   HPI:   ADHD- 'my husband has told me for a long time that I have ADHD and I need to get it checked'.  'I forget everything'.  Will start multiple tasks but rarely completes them.  Friend is a psychiatrist Eustaquio Boyden) who did an assessment and told her to contact PCP.  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  Denies sxs of hyperactivity.  Impacting her at both home and work.  Looking back she thinks she has always had sxs.  ROS:   See pertinent positives and negatives per HPI.  Patient Active Problem List   Diagnosis Date Noted  . Eustachian tube dysfunction, bilateral 03/30/2018  . Anemia   . Hypotension 12/18/2015  . Acute kidney injury ([place]) 12/18/2015  . Nausea and vomiting 12/18/2015  . Chest pain 12/18/2015  . Anxiety   . Dehydration   . Depression 08/08/2015  . Acute maxillary sinusitis 02/14/2015  . Atrial tachycardia ([place]) 08/26/2013  . Palpitations 08/26/2013  . Acute bronchitis 06/15/2013  . Atrial fibrillation with RVR ([place])   . DEPRESSION 11/15/2008  . ALLERGIC RHINITIS 11/15/2008  . FATIGUE 11/15/2008  . HEADACHE 11/15/2008  . 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 COLONIC POLYPS, HX OF 11/15/2008    Social History   Tobacco Use  . Smoking status: Never Smoker  . Smokeless tobacco: Never Used  Substance Use Topics  . Alcohol use: Yes    Alcohol/week: 0.0  standard drinks    Comment: 12/18/2015 "might have a glass of wine 2-3 times/month"    Current Outpatient Medications:  .  aspirin EC 81 MG tablet, Take 81 mg by mouth daily., Disp: , Rfl:  .  flecainide (TAMBOCOR) 50 MG tablet, Take 1 tablet (50 mg total) by mouth 2 (two) times daily., Disp: 180 tablet, Rfl: 3 .  Ibuprofen-Famotidine (DUEXIS) 800-26.6 MG TABS, Take 1 tablet by mouth as needed (pain). , Disp: , Rfl:  .  metoprolol succinate (TOPROL-XL) 25 MG 24 hr tablet, Take 0.5 tablets (12.5 mg total) by mouth daily., Disp: 45 tablet, Rfl: 3 .  PARoxetine (PAXIL-CR) 25 MG 24 hr tablet, TAKE 1 TABLET BY MOUTH EVERY DAY, Disp: 90 tablet, Rfl: 1 .  fluticasone (FLONASE) 50 MCG/ACT nasal spray, Place 2 sprays into both nostrils daily as needed for allergies. Reported on 02/14/2015, Disp: 16 g, Rfl: 0  Allergies  Allergen Reactions  . Codeine Itching  . Sulfonamide Derivatives Rash    Objective:   There were no vitals taken for this visit. AAOx3, NAD NCAT, EOMI No obvious CN deficits Coloring WNL Pt is able to speak clearly, coherently without shortness of breath or increased work of breathing.  Thought process is linear.  Mood is appropriate.   Assessment and Plan:   ADHD- new.  Pt had assessment done by friend who is a psychiatrist (off the record) who told her she needed to contact PCP b/c she absolutely met diagnostic criteria.  She is  frustrated with her inability to complete things, it is impacting her at work, and husband finds it difficult.  Will start Concerta 59EL and monitor for improvement.  Ran Concerta and Flecainide through interaction database and no red flags raised.  Discussed possible side effects of stimulants- including worsening anxiety and/or palpitations.  Will follow closely.  Pt expressed understanding and is in agreement w/ plan.    Annye Asa, MD 06/01/2019

## 2019-06-18 ENCOUNTER — Telehealth: Payer: Self-pay

## 2019-06-18 ENCOUNTER — Telehealth: Payer: Self-pay | Admitting: Family Medicine

## 2019-06-18 NOTE — Telephone Encounter (Signed)
LM asking pt to call back to schedule a reck from appt on 04/30

## 2019-06-18 NOTE — Telephone Encounter (Signed)
Refill request   methylphenidate (CONCERTA) 18 MG PO CR tablet  [id number] [place], [place] - [address] Phone:  [phone]  Fax:  [phone]      Next Appointment 07/09/19

## 2019-06-18 NOTE — Telephone Encounter (Signed)
Last OV 06/01/19 Concerta last filled 06/01/19 #30 with 0

## 2019-06-18 NOTE — Telephone Encounter (Signed)
This was filled 17 days ago.  It is too early for refill

## 2019-07-09 ENCOUNTER — Ambulatory Visit: Payer: BC Managed Care – PPO | Admitting: Family Medicine

## 2019-07-09 ENCOUNTER — Other Ambulatory Visit: Payer: Self-pay | Admitting: Family Medicine

## 2019-07-09 NOTE — Telephone Encounter (Signed)
Please advise, pt no showed appt today.   Last OV 06/01/19 Concerta last filled 06/01/19 #30 with 0

## 2019-07-09 NOTE — Telephone Encounter (Signed)
Patient called and made appointment for 07/23/2019 - she would like a [facility]ill of her [facility] [address], [place]

## 2019-07-09 NOTE — Addendum Note (Signed)
Addended by: Geannie Risen on: 07/09/2019 04:12 PM   Modules accepted: Orders

## 2019-07-23 ENCOUNTER — Encounter: Payer: Self-pay | Admitting: Family Medicine

## 2019-07-23 ENCOUNTER — Telehealth (INDEPENDENT_AMBULATORY_CARE_PROVIDER_SITE_OTHER): Payer: BC Managed Care – PPO | Admitting: Family Medicine

## 2019-07-23 ENCOUNTER — Other Ambulatory Visit: Payer: Self-pay

## 2019-07-23 DIAGNOSIS — F900 Attention-deficit hyperactivity disorder, predominantly inattentive type: Secondary | ICD-10-CM

## 2019-07-23 MED ORDER — METHYLPHENIDATE HCL ER (OSM) 27 MG PO TBCR: 27.0000 mg | EXTENDED_RELEASE_TABLET | ORAL | 0 refills | Status: DC

## 2019-07-23 NOTE — Progress Notes (Signed)
I have discussed the procedure for the virtual visit with the patient who has given consent to proceed with assessment and treatment.   Pt unable to obtain vitals. Pt has not had the medication in 3 weeks. Said that called the office several times to get something to hold her over until appt. Medication dose when she did take it wore off by afternoon.     Geannie Risen, CMA

## 2019-07-23 NOTE — Progress Notes (Signed)
Virtual Visit via Video   I connected with patient on 07/23/19 at  8:00 AM EDT by a video enabled telemedicine application and verified that I am speaking with the correct person using two identifiers.  Location patient: Home Location provider: Astronomer, Office Persons participating in the virtual visit: Patient, Provider, CMA (Jess B)  I discussed the limitations of evaluation and management by telemedicine and the availability of in person appointments. The patient expressed understanding and agreed to proceed.  Subjective:   HPI:   ADHD- pt was started on Concerta 18mg  at last visit.  Pt reports that the 'first 4-5 days it was pretty good'.  Then she noticed that by 3:00pm she was again having difficulty w/ focus.  Denies side effects from medication- no palpitations or insomnia.    ROS:   See pertinent positives and negatives per HPI.  Patient Active Problem List   Diagnosis Date Noted  . Eustachian tube dysfunction, bilateral 03/30/2018  . Anemia   . Hypotension 12/18/2015  . Acute kidney injury (HCC) 12/18/2015  . Nausea and vomiting 12/18/2015  . Chest pain 12/18/2015  . Anxiety   . Dehydration   . Depression 08/08/2015  . Acute maxillary sinusitis 02/14/2015  . Atrial tachycardia ([facility]CC) 08/26/2013  . Palpitations 08/26/2013  . Acute bronchitis 06/15/2013  . Atrial fibrillation with RVR ([facility])   . DEPRESSION 11/15/2008  . ALLERGIC RHINITIS 11/15/2008  . FATIGUE 11/15/2008  . HEADACHE 11/15/2008  . 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 COLONIC POLYPS, HX OF 11/15/2008    Social History   Tobacco Use  . Smoking status: Never Smoker  . Smokeless tobacco: Never Used  Substance Use Topics  . Alcohol use: Yes    Alcohol/week: 0.0 standard drinks    Comment: 12/18/2015 "might have a glass of wine 2-3 times/month"    Current Outpatient Medications:  .  aspirin EC 81 MG tablet, Take 81 mg by mouth daily., Disp: , Rfl:  .  flecainide (TAMBOCOR) 50  MG tablet, Take 1 tablet (50 mg total) by mouth 2 (two) times daily., Disp: 180 tablet, Rfl: 3 .  Ibuprofen-Famotidine (DUEXIS) 800-26.6 MG TABS, Take 1 tablet by mouth as needed (pain). , Disp: , Rfl:  .  methylphenidate (CONCERTA) 18 MG PO CR tablet, Take 1 tablet (18 mg total) by mouth daily., Disp: 30 tablet, Rfl: 0 .  metoprolol succinate (TOPROL-XL) 25 MG 24 hr tablet, Take 0.5 tablets (12.5 mg total) by mouth daily., Disp: 45 tablet, Rfl: 3 .  PARoxetine (PAXIL-CR) 25 MG 24 hr tablet, TAKE 1 TABLET BY MOUTH EVERY DAY, Disp: 90 tablet, Rfl: 1 .  fluticasone (FLONASE) 50 MCG/ACT nasal spray, Place 2 sprays into both nostrils daily as needed for allergies. Reported on 02/14/2015, Disp: 16 g, Rfl: 0  Allergies  Allergen Reactions  . Codeine Itching  . Sulfonamide Derivatives Rash    Objective:   There were no vitals taken for this visit.  AAOx3, NAD NCAT, EOMI No obvious CN deficits Coloring WNL Pt is able to speak clearly, coherently without shortness of breath or increased work of breathing.  Thought process is linear.  Mood is appropriate.   Assessment and Plan:   ADHD- pt reports sxs are fairly well controlled on Concerta but wears off by mid-afternoon.  Will increase dose of long acting medication and if no improvement in afternoon sxs, will consider adding a short acting medication for afternoon focus.  Pt expressed understanding and is in agreement w/ plan.  Annye Asa, MD 07/23/2019

## 2019-07-27 ENCOUNTER — Encounter: Payer: Self-pay | Admitting: General Practice

## 2019-07-27 DIAGNOSIS — F988 Other specified behavioral and emotional disorders with onset usually occurring in childhood and adolescence: Secondary | ICD-10-CM | POA: Insufficient documentation

## 2019-08-27 ENCOUNTER — Ambulatory Visit (HOSPITAL_COMMUNITY): Payer: BC Managed Care – PPO | Admitting: Nurse Practitioner

## 2019-08-28 ENCOUNTER — Telehealth: Payer: Self-pay | Admitting: Family Medicine

## 2019-08-28 ENCOUNTER — Other Ambulatory Visit: Payer: Self-pay | Admitting: Obstetrics & Gynecology

## 2019-08-28 NOTE — Telephone Encounter (Signed)
LM for pt to call back to schedule a F/UP on ADHD with Beverely Low

## 2019-09-28 ENCOUNTER — Other Ambulatory Visit: Payer: Self-pay | Admitting: Internal Medicine

## 2019-10-05 ENCOUNTER — Ambulatory Visit (HOSPITAL_COMMUNITY)
Admission: RE | Admit: 2019-10-05 | Discharge: 2019-10-05 | Disposition: A | Discharge status: Home / Self Care | Payer: BC Managed Care – PPO | Source: Ambulatory Visit | Attending: Nurse Practitioner | Admitting: Nurse Practitioner

## 2019-10-05 ENCOUNTER — Other Ambulatory Visit: Payer: Self-pay

## 2019-10-05 ENCOUNTER — Encounter (HOSPITAL_COMMUNITY): Payer: Self-pay | Admitting: Nurse Practitioner

## 2019-10-05 VITALS — BP 126/78 | HR 70 | Ht 63.5 in | Wt 158.6 lb

## 2019-10-05 DIAGNOSIS — I480 Paroxysmal atrial fibrillation: Secondary | ICD-10-CM | POA: Diagnosis not present

## 2019-10-05 DIAGNOSIS — Z885 Allergy status to narcotic agent status: Secondary | ICD-10-CM | POA: Insufficient documentation

## 2019-10-05 DIAGNOSIS — F329 Major depressive disorder, single episode, unspecified: Secondary | ICD-10-CM | POA: Insufficient documentation

## 2019-10-05 DIAGNOSIS — Z803 Family history of malignant neoplasm of breast: Secondary | ICD-10-CM | POA: Insufficient documentation

## 2019-10-05 DIAGNOSIS — Z8249 Family history of ischemic heart disease and other diseases of the circulatory system: Secondary | ICD-10-CM | POA: Diagnosis not present

## 2019-10-05 DIAGNOSIS — Z882 Allergy status to sulfonamides status: Secondary | ICD-10-CM | POA: Diagnosis not present

## 2019-10-05 DIAGNOSIS — G4700 Insomnia, unspecified: Secondary | ICD-10-CM | POA: Diagnosis not present

## 2019-10-05 DIAGNOSIS — Z7982 Long term (current) use of aspirin: Secondary | ICD-10-CM | POA: Diagnosis not present

## 2019-10-05 DIAGNOSIS — Z79899 Other long term (current) drug therapy: Secondary | ICD-10-CM | POA: Insufficient documentation

## 2019-10-05 DIAGNOSIS — F419 Anxiety disorder, unspecified: Secondary | ICD-10-CM | POA: Diagnosis not present

## 2019-10-05 MED ORDER — METOPROLOL SUCCINATE ER 25 MG PO TB24: 12.5000 mg | ORAL_TABLET | Freq: Every day | ORAL | 3 refills | Status: DC

## 2019-10-05 NOTE — Progress Notes (Signed)
Primary Care Physician: Midge Minium, MD Referring Physician: Dr. Casilda Carls is a 54 y.o. female with a h/o afib, on flecainide for yearly f/u. She reports that she is doing well. Has not appreciated andy afib. She continues on flecainide 50 mg bid. She had been on 12.5 metoprolol succinate qd but ran out last week. She wanted to check today to see if necessary to stay on it. She is on asa for a CHA2DS2VASc score of 1 for female.   Today, she denies symptoms of palpitations, chest pain, shortness of breath, orthopnea, PND, lower extremity edema, dizziness, presyncope, syncope, or neurologic sequela. The patient is tolerating medications without difficulties and is otherwise without complaint today.   Past Medical History:  Diagnosis Date  . Anxiety   . BRCA1 negative    NEGATIVE MUTATION 01/05/06  . BRCA2 negative    NEGATIVE MUTATION 01/05/06  . Depression   . Dysplasia of cervix, unspecified   . Herpes   . Hyperprolactinemia ([place])   . Insomnia   . Migraine    "2-3/year" (12/18/2015)  . Paroxysmal atrial fibrillation (HCC)   . TMJ (dislocation of temporomandibular joint)    Past Surgical History:  Procedure Laterality Date  . AUGMENTATION MAMMAPLASTY Bilateral 1998  . BREAST BIOPSY Bilateral 2001-2003   RIGHT BREAST BIOPSY 2001-PAPILOMA/LT BR BXY-BENIGN 2003  . CERVIX LESION DESTRUCTION  ~ 1993  . CLAVICLE HARDWARE REMOVAL Right ~ 2008  . EYE SURGERY    . FRACTURE SURGERY    . HYSTEROSCOPY N/A 07/27/2013   Procedure: HYSTEROSCOPY WITH HYDROTHERMAL ABLATION;  Surgeon: Terrance Mass, MD;  Location: [facility];  Service: Gynecology;  Laterality: N/A;  . LAPAROSCOPIC CHOLECYSTECTOMY  2011  . ORIF CLAVICLE FRACTURE Right ~ 2008   CLAVICLE FRACTURE-TRAUMATIC  . REFRACTIVE SURGERY Bilateral 1990s  . SHOULDER ARTHROSCOPY Right 2016   "labrium cartilage repair"  . SHOULDER ARTHROSCOPY WITH ROTATOR CUFF REPAIR Right 2000  . TRIGGER FINGER RELEASE Right     thumb  . tube in ear Left     Current Outpatient Medications  Medication Sig Dispense Refill  . flecainide (TAMBOCOR) 50 MG tablet TAKE 1 TABLET BY MOUTH TWICE A DAY 180 tablet 0  . fluticasone (FLONASE) 50 MCG/ACT nasal spray Place 2 sprays into both nostrils daily as needed for allergies. Reported on 02/14/2015 16 g 0  . ibuprofen (ADVIL) 200 MG tablet Taking 4 tablets by mouth as needed for arthritis.    Marland Kitchen PARoxetine (PAXIL-CR) 25 MG 24 hr tablet TAKE 1 TABLET BY MOUTH EVERY DAY 90 tablet 0  . aspirin EC 81 MG tablet Take 81 mg by mouth daily. (Patient not taking: Reported on 10/05/2019)    . methylphenidate (CONCERTA) 27 MG PO CR tablet Take 1 tablet (27 mg total) by mouth every morning. (Patient not taking: Reported on 10/05/2019) 30 tablet 0  . metoprolol succinate (TOPROL-XL) 25 MG 24 hr tablet Take 0.5 tablets (12.5 mg total) by mouth daily. (Patient not taking: Reported on 10/05/2019) 45 tablet 3   No current facility-administered medications for this encounter.    Allergies  Allergen Reactions  . Codeine Itching  . Sulfonamide Derivatives Rash    Social History   Socioeconomic History  . Marital status: Married    Spouse name: Not on file  . Number of children: Not on file  . Years of education: Not on file  . Highest education level: Not on file  Occupational History  . Not on file  Tobacco Use  . Smoking status: Never Smoker  . Smokeless tobacco: Never Used  Vaping Use  . Vaping Use: Never used  Substance and Sexual Activity  . Alcohol use: Yes    Alcohol/week: 0.0 standard drinks    Comment: 12/18/2015 "might have a glass of wine 2-3 times/month"  . Drug use: No  . Sexual activity: Yes    Birth control/protection: Condom  Other Topics Concern  . Not on file  Social History Narrative  . Not on file   Social Determinants of Health   Financial Resource Strain:   . Difficulty of Paying Living Expenses: Not on file  Food Insecurity:   . Worried About Paediatric nurse in the Last Year: Not on file  . Ran Out of Food in the Last Year: Not on file  Transportation Needs:   . Lack of Transportation (Medical): Not on file  . Lack of Transportation (Non-Medical): Not on file  Physical Activity:   . Days of Exercise per Week: Not on file  . Minutes of Exercise per Session: Not on file  Stress:   . Feeling of Stress : Not on file  Social Connections:   . Frequency of Communication with Friends and Family: Not on file  . Frequency of Social Gatherings with Friends and Family: Not on file  . Attends Religious Services: Not on file  . Active Member of Clubs or Organizations: Not on file  . Attends Archivist Meetings: Not on file  . Marital Status: Not on file  Intimate Partner Violence:   . Fear of Current or Ex-Partner: Not on file  . Emotionally Abused: Not on file  . Physically Abused: Not on file  . Sexually Abused: Not on file    Family History  Problem Relation Age of Onset  . Breast cancer Mother 31  . Hypertension Father   . Atrial fibrillation Brother     ROS- All systems are reviewed and negative except as per the HPI above  Physical Exam: Vitals:   10/05/19 1105  Weight: 71.9 kg  Height: 5' 3.5" (1.613 m)   Wt Readings from Last 3 Encounters:  10/05/19 71.9 kg  12/11/18 71.7 kg  07/24/18 69.9 kg    Labs: Lab Results  Component Value Date   NA 140 01/16/2016   K 4.6 01/16/2016   CL 103 01/16/2016   CO2 31 01/16/2016   GLUCOSE 83 01/16/2016   BUN 17 01/16/2016   CREATININE 0.92 01/16/2016   CALCIUM 9.6 01/16/2016   Lab Results  Component Value Date   INR 1.18 04/01/2011   Lab Results  Component Value Date   CHOL 161 08/22/2015   HDL 77 08/22/2015   LDLCALC 69 08/22/2015   TRIG 74 08/22/2015     GEN- The patient is well appearing, alert and oriented x 3 today.   Head- normocephalic, atraumatic Eyes-  Sclera clear, conjunctiva pink Ears- hearing intact Oropharynx- clear Neck- supple, no  JVP Lymph- no cervical lymphadenopathy Lungs- Clear to ausculation bilaterally, normal work of breathing Heart- Regular rate and rhythm, no murmurs, rubs or gallops, PMI not laterally displaced GI- soft, NT, ND, + BS Extremities- no clubbing, cyanosis, or edema MS- no significant deformity or atrophy Skin- no rash or lesion Psych- euthymic mood, full affect Neuro- strength and sensation are intact  EKG-NSR at 70 bpm, pr int 152 ms, qrs int 68 ms, qtc 406 ms    Assessment and Plan: 1. Paroxysmal  afib  Has  been very quiet Continue flecainide 50 mg bid  She stopped BB opne week ago  Was explained to pt that we like her to be on rate control with flecainide to avoid a rapid atrial flutter  She will continue metoprolol 12.5 mg qd   2. CHA2DS2VASc score of 1 Continue  asa daily Not not need anticoagulation by guidelines   3. Weight gain Discussed  regular exercise Calorie restriction    F/u in one year   Donna C. Carroll, ANP-C [facility] [address] [place], [place] [address] [phone]   

## 2019-10-15 ENCOUNTER — Ambulatory Visit: Payer: BC Managed Care – PPO | Admitting: Family Medicine

## 2019-10-29 ENCOUNTER — Telehealth (INDEPENDENT_AMBULATORY_CARE_PROVIDER_SITE_OTHER): Payer: BC Managed Care – PPO | Admitting: Family Medicine

## 2019-10-29 ENCOUNTER — Other Ambulatory Visit: Payer: Self-pay

## 2019-10-29 ENCOUNTER — Encounter: Payer: Self-pay | Admitting: Family Medicine

## 2019-10-29 DIAGNOSIS — F900 Attention-deficit hyperactivity disorder, predominantly inattentive type: Secondary | ICD-10-CM

## 2019-10-29 MED ORDER — AMPHETAMINE-DEXTROAMPHET ER 15 MG PO CP24: 15.0000 mg | ORAL_CAPSULE | ORAL | 0 refills | Status: DC

## 2019-10-29 NOTE — Progress Notes (Signed)
I have discussed the procedure for the virtual visit with the patient who has given consent to proceed with assessment and treatment.   Pt unable to obtain vitals.   Zell Hylton L Shela Esses, CMA     

## 2019-10-29 NOTE — Progress Notes (Signed)
Virtual Visit via Video   I connected with patient on 10/29/19 at  1:00 PM EDT by a video enabled telemedicine application and verified that I am speaking with the correct person using two identifiers.  Location patient: Home Location provider: [facility], Office Persons participating in the virtual visit: Patient, Provider, CMA (Jess B)  I discussed the limitations of evaluation and management by telemedicine and the availability of in person appointments. The patient expressed understanding and agreed to proceed.  Subjective:   HPI:   ADHD- pt was initially started on Concerta 18mg  but found that it was wearing off too soon.  We increased to 27mg  daily at last visit but pt never followed up to let me know if that improved her sxs.  Pt doesn't feel that increasing to 27mg  'made any difference at all'.  Despite the medication she was still having issues w/ focus and task completion.  Pt did not notice a difference between morning or afternoon.    ROS:   See pertinent positives and negatives per HPI.  Patient Active Problem List   Diagnosis Date Noted  . ADD (attention deficit disorder) 07/27/2019  . Eustachian tube dysfunction, bilateral 03/30/2018  . Anemia   . Hypotension 12/18/2015  . Acute kidney injury (HCC) 12/18/2015  . Nausea and vomiting 12/18/2015  . Chest pain 12/18/2015  . Anxiety   . Dehydration   . Depression 08/08/2015  . Acute maxillary sinusitis 02/14/2015  . Atrial tachycardia (HCC) 08/26/2013  . Palpitations 08/26/2013  . Acute bronchitis 06/15/2013  . Atrial fibrillation with RVR (HCC)   . DEPRESSION 11/15/2008  . ALLERGIC RHINITIS 11/15/2008  . FATIGUE 11/15/2008  . HEADACHE 11/15/2008  . 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 COLONIC POLYPS, HX OF 11/15/2008    Social History   Tobacco Use  . Smoking status: Never Smoker  . Smokeless tobacco: Never Used  Substance Use Topics  . Alcohol use: Yes    Alcohol/week: 0.0 standard  drinks    Comment: 12/18/2015 "might have a glass of wine 2-3 times/month"    Current Outpatient Medications:  .  flecainide (TAMBOCOR) 50 MG tablet, TAKE 1 TABLET BY MOUTH TWICE A DAY, Disp: 180 tablet, Rfl: 0 .  ibuprofen (ADVIL) 200 MG tablet, Taking 4 tablets by mouth as needed for arthritis., Disp: , Rfl:  .  metoprolol succinate (TOPROL-XL) 25 MG 24 hr tablet, Take 0.5 tablets (12.5 mg total) by mouth daily., Disp: 45 tablet, Rfl: 3 .  PARoxetine (PAXIL-CR) 25 MG 24 hr tablet, TAKE 1 TABLET BY MOUTH EVERY DAY, Disp: 90 tablet, Rfl: 0 .  aspirin EC 81 MG tablet, Take 81 mg by mouth daily.  (Patient not taking: Reported on 10/29/2019), Disp: , Rfl:  .  fluticasone (FLONASE) 50 MCG/ACT nasal spray, Place 2 sprays into both nostrils daily as needed for allergies. Reported on 02/14/2015, Disp: 16 g, Rfl: 0 .  methylphenidate (CONCERTA) 27 MG PO CR tablet, Take 1 tablet (27 mg total) by mouth every morning. (Patient not taking: Reported on 10/29/2019), Disp: 30 tablet, Rfl: 0  Allergies  Allergen Reactions  . Codeine Itching  . Sulfonamide Derivatives Rash    Objective:   There were no vitals taken for this visit. AAOx3, NAD NCAT, EOMI No obvious CN deficits Coloring WNL Pt is able to speak clearly, coherently without shortness of breath or increased work of breathing.  Thought process is linear.  Mood is appropriate.   Assessment and Plan:   ADHD- no improvement w/  increased dose of Concerta.  We had discussion about possible side effects and benefits of switching stimulant classes.  At this time, will switch to extended release Adderall 15mg  daily.  Pt expressed understanding and is in agreement w/ plan.      , MD 10/29/2019

## 2019-11-05 ENCOUNTER — Other Ambulatory Visit: Payer: Self-pay | Admitting: Obstetrics & Gynecology

## 2019-11-05 DIAGNOSIS — Z1231 Encounter for screening mammogram for malignant neoplasm of breast: Secondary | ICD-10-CM

## 2019-11-28 ENCOUNTER — Encounter: Payer: Self-pay | Admitting: Family Medicine

## 2019-11-28 ENCOUNTER — Telehealth (INDEPENDENT_AMBULATORY_CARE_PROVIDER_SITE_OTHER): Payer: BC Managed Care – PPO | Admitting: Family Medicine

## 2019-11-28 DIAGNOSIS — H93A9 Pulsatile tinnitus, unspecified ear: Secondary | ICD-10-CM

## 2019-11-28 DIAGNOSIS — F900 Attention-deficit hyperactivity disorder, predominantly inattentive type: Secondary | ICD-10-CM

## 2019-11-28 MED ORDER — AMPHETAMINE-DEXTROAMPHET ER 20 MG PO CP24: 20.0000 mg | ORAL_CAPSULE | ORAL | 0 refills | Status: DC

## 2019-11-28 NOTE — Progress Notes (Signed)
Virtual Visit via Video   I connected with patient on 11/28/19 at  1:00 PM EDT by a video enabled telemedicine application and verified that I am speaking with the correct person using two identifiers.  Location patient: Home Location provider: [place] [facility], Office Persons participating in the virtual visit: Patient, Provider, CMA (Sabrina M)  I discussed the limitations of evaluation and management by telemedicine and the availability of in person appointments. The patient expressed understanding and agreed to proceed.  Subjective:   HPI:   ADHD- At last visit pt was switched from Concerta to Adderall 15mg  daily.  'I think it's been better'.  Pt reports noticing improvement the first couple weeks but is again feeling 'scatterbrained'.  No palpitations, anxiety, insomnia.  Occasionally will feel shaky, 'but it doesn't last'.    Pulsatile tinnitus- L ear.  ~1 yr ago had eustachian tube dysfxn and had a tube placed.  Recently she has noticed that she will hear a 'whoosh' when lying down.  Pt did some reading and is now concerned about vascular issue vs Padgett's disease  ROS:   See pertinent positives and negatives per HPI.  Patient Active Problem List   Diagnosis Date Noted  . ADD (attention deficit disorder) 07/27/2019  . Eustachian tube dysfunction, bilateral 03/30/2018  . Anemia   . Hypotension 12/18/2015  . Acute kidney injury (HCC) 12/18/2015  . Nausea and vomiting 12/18/2015  . Chest pain 12/18/2015  . Anxiety   . Dehydration   . Depression 08/08/2015  . Acute maxillary sinusitis 02/14/2015  . Atrial tachycardia (HCC) 08/26/2013  . Palpitations 08/26/2013  . Acute bronchitis 06/15/2013  . Atrial fibrillation with RVR (HCC)   . DEPRESSION 11/15/2008  . ALLERGIC RHINITIS 11/15/2008  . FATIGUE 11/15/2008  . HEADACHE 11/15/2008  . 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 COLONIC POLYPS, HX OF 11/15/2008    Social History   Tobacco Use  . Smoking status:  Never Smoker  . Smokeless tobacco: Never Used  Substance Use Topics  . Alcohol use: Yes    Alcohol/week: 0.0 standard drinks    Comment: 12/18/2015 "might have a glass of wine 2-3 times/month"    Current Outpatient Medications:  .  amphetamine-dextroamphetamine (ADDERALL XR) 15 MG 24 hr capsule, Take 1 capsule by mouth every morning., Disp: 30 capsule, Rfl: 0 .  flecainide (TAMBOCOR) 50 MG tablet, TAKE 1 TABLET BY MOUTH TWICE A DAY, Disp: 180 tablet, Rfl: 0 .  ibuprofen (ADVIL) 200 MG tablet, Taking 4 tablets by mouth as needed for arthritis., Disp: , Rfl:  .  metoprolol succinate (TOPROL-XL) 25 MG 24 hr tablet, Take 0.5 tablets (12.5 mg total) by mouth daily., Disp: 45 tablet, Rfl: 3 .  PARoxetine (PAXIL-CR) 25 MG 24 hr tablet, TAKE 1 TABLET BY MOUTH EVERY DAY, Disp: 90 tablet, Rfl: 0 .  aspirin EC 81 MG tablet, Take 81 mg by mouth daily.  (Patient not taking: Reported on 10/29/2019), Disp: , Rfl:  .  fluticasone (FLONASE) 50 MCG/ACT nasal spray, Place 2 sprays into both nostrils daily as needed for allergies. Reported on 02/14/2015, Disp: 16 g, Rfl: 0  Allergies  Allergen Reactions  . Codeine Itching  . Sulfonamide Derivatives Rash    Objective:   There were no vitals taken for this visit. AAOx3, NAD NCAT, EOMI No obvious CN deficits Coloring WNL Pt is able to speak clearly, coherently without shortness of breath or increased work of breathing.  Thought process is linear.  Mood is appropriate.   Assessment  and Plan:   ADHD- pt reported improved sxs when she switched from Concerta to Adderall but since then, she has noticed that things have returned to baseline.  Will increase dose of Adderall XR to 20mg  daily and monitor for improvement.  Pt expressed understanding and is in agreement w/ plan.   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  She has seen ENT and a tube was placed for eustachian dysfxn.  No improvement in sxs.  She was started on Flonase.  No improvement in  sxs.  At this time, needs imaging to assess for vascular abnormality.  Will get CTA head and neck.  Pt expressed understanding and is in agreement w/ plan.    , MD 11/28/2019

## 2019-11-28 NOTE — Progress Notes (Signed)
I connected with  Jenna Cortez on 11/28/19 by a video enabled telemedicine application and verified that I am speaking with the correct person using two identifiers.   I discussed the limitations of evaluation and management by telemedicine. The patient expressed understanding and agreed to proceed.

## 2019-12-04 ENCOUNTER — Telehealth: Payer: Self-pay

## 2019-12-04 DIAGNOSIS — H93A9 Pulsatile tinnitus, unspecified ear: Secondary | ICD-10-CM

## 2019-12-04 NOTE — Telephone Encounter (Signed)
Called [facility] to get patient scheduled. Informed that head CT needs to be IMG 786. The current CT ordered can only be performed at the hospital.

## 2019-12-05 NOTE — Telephone Encounter (Signed)
Ok to change to [employer] 786

## 2019-12-05 NOTE — Telephone Encounter (Signed)
ORDER PLACED

## 2019-12-05 NOTE — Addendum Note (Signed)
Addended by: Geannie Risen on: 12/05/2019 07:45 AM   Modules accepted: Orders

## 2019-12-05 NOTE — Telephone Encounter (Signed)
Pt has been scheduled.  °

## 2019-12-10 ENCOUNTER — Ambulatory Visit: Payer: BC Managed Care – PPO

## 2019-12-17 ENCOUNTER — Encounter: Payer: BC Managed Care – PPO | Admitting: Obstetrics & Gynecology

## 2019-12-20 ENCOUNTER — Other Ambulatory Visit: Payer: BC Managed Care – PPO

## 2020-01-04 ENCOUNTER — Telehealth: Payer: Self-pay | Admitting: Family Medicine

## 2020-01-04 NOTE — Telephone Encounter (Signed)
Medication Refills  Medication:  Adderall  [facility]  ** Let patient know to contact pharmacy at the end of the day to make sure medication is ready.**  ** Please notify patient to allow 48-72 hours to process.**  ** Encourage patient to contact the pharmacy for refills or they can request refills through [facility]**  Clinical Fills out below:   Last refill:  QTY:  Refill Date:    Other Comments:   Okay for refill?  Please advise.

## 2020-01-07 ENCOUNTER — Telehealth: Payer: Self-pay

## 2020-01-07 MED ORDER — AMPHETAMINE-DEXTROAMPHET ER 20 MG PO CP24: 20.0000 mg | ORAL_CAPSULE | ORAL | 0 refills | Status: DC

## 2020-01-07 NOTE — Telephone Encounter (Signed)
Left vm that Rx has been sent to pharmacy per provider.

## 2020-01-07 NOTE — Telephone Encounter (Signed)
Called pt and left about Rx Adderral XR. Sent to pharmacy per Dr.Tabori

## 2020-01-07 NOTE — Telephone Encounter (Signed)
Refill sent at pt's request.

## 2020-01-14 ENCOUNTER — Ambulatory Visit: Payer: BC Managed Care – PPO

## 2020-01-23 ENCOUNTER — Other Ambulatory Visit: Payer: BC Managed Care – PPO

## 2020-01-28 ENCOUNTER — Other Ambulatory Visit: Payer: Self-pay

## 2020-01-28 ENCOUNTER — Ambulatory Visit
Admission: RE | Admit: 2020-01-28 | Discharge: 2020-01-28 | Disposition: A | Discharge status: Home / Self Care | Payer: BC Managed Care – PPO | Source: Ambulatory Visit | Attending: Obstetrics & Gynecology | Admitting: Obstetrics & Gynecology

## 2020-01-28 DIAGNOSIS — Z1231 Encounter for screening mammogram for malignant neoplasm of breast: Secondary | ICD-10-CM

## 2020-02-04 ENCOUNTER — Encounter: Payer: BC Managed Care – PPO | Admitting: Obstetrics & Gynecology

## 2020-02-12 ENCOUNTER — Telehealth: Payer: Self-pay | Admitting: Family Medicine

## 2020-02-12 NOTE — Telephone Encounter (Signed)
Pt called in asking for a new script of the Adderall XR to be sent to the [facility] [address]   Please advise   Pt is aware that Dr. Beverely Low is out of the office this week but is out of her medication wanted to know if this could be taken care of by [facility].

## 2020-02-13 MED ORDER — AMPHETAMINE-DEXTROAMPHET ER 20 MG PO CP24: 20.0000 mg | ORAL_CAPSULE | ORAL | 0 refills | Status: DC

## 2020-02-13 NOTE — Telephone Encounter (Signed)
Refill has been sent.  °

## 2020-02-13 NOTE — Telephone Encounter (Signed)
LR: 01/07/2020 Qty: 30 with 0 refills  Last office visit: 11/28/2019 Upcoming appointment: No pending appt

## 2020-03-03 ENCOUNTER — Telehealth: Payer: Self-pay | Admitting: Family Medicine

## 2020-03-03 NOTE — Telephone Encounter (Signed)
Patient called back - [place] is going [place] the [place] but will be back by the 10th so she does not need the refill yet.

## 2020-03-03 NOTE — Telephone Encounter (Signed)
..  Medication Refills  Last OV:  11/28/2019  Medication:  Adderall 20mg    [facility] [address]  Let patient know to contact pharmacy at the end of the day to make sure medication is ready.   Please notify patient to allow 48-72 hours to process.  Encourage patient to contact the pharmacy for refills or they can request refills through [facility]  Clinical Fills out below:   Last refill: 02/13/2020  QTY:  Refill Date:  02/13/2020    Other Comments:   Okay for refill?  Please advise.

## 2020-03-03 NOTE — Telephone Encounter (Signed)
Too soon to be filled.  Last filled 02/13/19.  Left message on machine to let patient know that we sent in on 02/13/19 and to call back.

## 2020-03-04 NOTE — Telephone Encounter (Signed)
Noted  

## 2020-03-24 ENCOUNTER — Other Ambulatory Visit: Payer: Self-pay | Admitting: Family Medicine

## 2020-03-24 DIAGNOSIS — F900 Attention-deficit hyperactivity disorder, predominantly inattentive type: Secondary | ICD-10-CM

## 2020-03-24 MED ORDER — AMPHETAMINE-DEXTROAMPHET ER 20 MG PO CP24: 20.0000 mg | ORAL_CAPSULE | ORAL | 0 refills | Status: DC

## 2020-03-24 NOTE — Telephone Encounter (Signed)
Adderall last rx 02/13/20 #30 LOV: 11/28/19 ADHD

## 2020-03-24 NOTE — Telephone Encounter (Signed)
..  Medication Refills  Last OV:  Medication:  Adderall 20 mg  [facility] [address]  Let patient know to contact pharmacy at the end of the day to make sure medication is ready.   Please notify patient to allow 48-72 hours to process.  Encourage patient to contact the pharmacy for refills or they can request refills through [facility]  Clinical Fills out below:   Last refill:  QTY:  Refill Date:    Other Comments:   Okay for refill?  Please advise.

## 2020-04-04 ENCOUNTER — Encounter: Payer: BC Managed Care – PPO | Admitting: Obstetrics & Gynecology

## 2020-04-12 ENCOUNTER — Other Ambulatory Visit: Payer: Self-pay | Admitting: Internal Medicine

## 2020-04-21 ENCOUNTER — Other Ambulatory Visit: Payer: Self-pay | Admitting: Obstetrics & Gynecology

## 2020-04-21 NOTE — Telephone Encounter (Signed)
Medication refill request: Paxil  Last AEX:  12-11-2018 ML Next AEX: 06-13-20 Last MMG (if hormonal medication request): n/a Refill authorized: Today, please advise.   Medication pended for #90, 0RF. Please refill if appropriate.

## 2020-04-28 ENCOUNTER — Telehealth: Payer: Self-pay | Admitting: Family Medicine

## 2020-04-28 DIAGNOSIS — F900 Attention-deficit hyperactivity disorder, predominantly inattentive type: Secondary | ICD-10-CM

## 2020-04-28 MED ORDER — AMPHETAMINE-DEXTROAMPHET ER 20 MG PO CP24: 20.0000 mg | ORAL_CAPSULE | ORAL | 0 refills | Status: DC

## 2020-04-28 NOTE — Addendum Note (Signed)
Addended by: Sheliah Hatch on: 04/28/2020 04:13 PM   Modules accepted: Orders

## 2020-04-28 NOTE — Telephone Encounter (Signed)
Prescription sent at pt request 

## 2020-04-28 NOTE — Telephone Encounter (Signed)
Requesting:Adderral 20mg  Contract: UDS: Last Visit:11/28/19 v/v Next Visit:06/10/20 Last Refill:03/24/20 30 tabs 0 refills  Please Advise

## 2020-04-28 NOTE — Telephone Encounter (Signed)
...  Medication Refills  Last OV:  Medication:  Adderall  [facility] [address]  Let patient know to contact pharmacy at the end of the day to make sure medication is ready.   Please notify patient to allow 48-72 hours to process.  Encourage patient to contact the pharmacy for refills or they can request refills through [facility]  Clinical Fills out below:   Last refill:  QTY:  Refill Date:    Other Comments:   Okay for refill?  Please advise.

## 2020-06-03 ENCOUNTER — Telehealth: Payer: Self-pay | Admitting: Family Medicine

## 2020-06-03 DIAGNOSIS — F900 Attention-deficit hyperactivity disorder, predominantly inattentive type: Secondary | ICD-10-CM

## 2020-06-03 MED ORDER — AMPHETAMINE-DEXTROAMPHET ER 20 MG PO CP24: 20.0000 mg | ORAL_CAPSULE | ORAL | 0 refills | Status: DC

## 2020-06-03 NOTE — Telephone Encounter (Signed)
Pt needs a refill on the Adderall she has an upcoming appt with Tabori on 06/10/20 but will be out of the medication before. Please advise

## 2020-06-03 NOTE — Telephone Encounter (Signed)
Requesting:Adderall XR 20mg  Contract: UDS: Last Visit: Next Visit:06/10/20 Last Refill:04/28/20 30 caps 0 refills  Please Advise

## 2020-06-03 NOTE — Telephone Encounter (Signed)
Prescription filled at pt's request ?

## 2020-06-10 ENCOUNTER — Telehealth (INDEPENDENT_AMBULATORY_CARE_PROVIDER_SITE_OTHER): Payer: BC Managed Care – PPO | Admitting: Family Medicine

## 2020-06-10 ENCOUNTER — Encounter: Payer: Self-pay | Admitting: Family Medicine

## 2020-06-10 DIAGNOSIS — F900 Attention-deficit hyperactivity disorder, predominantly inattentive type: Secondary | ICD-10-CM | POA: Diagnosis not present

## 2020-06-10 MED ORDER — AMPHETAMINE-DEXTROAMPHETAMINE 20 MG PO TABS: 20.0000 mg | ORAL_TABLET | Freq: Every day | ORAL | 0 refills | Status: DC

## 2020-06-10 NOTE — Progress Notes (Signed)
Virtual Visit via Video   I connected with patient on 06/10/20 at  1:00 PM EDT by a video enabled telemedicine application and verified that I am speaking with the correct person using two identifiers.  Location patient: Home Location provider: [place] [facility], Office Persons participating in the virtual visit: Patient, Provider, CMA Thea Silversmith H)  I discussed the limitations of evaluation and management by telemedicine and the availability of in person appointments. The patient expressed understanding and agreed to proceed.  Subjective:   HPI:   ADHD- 'I just feel like this medicine is not helping through the day'.  Pt reports that she will take Adderall around 7-7:30 and it will last until 12:30-1:00. Pt feels dose is appropriate during the morning hours when it is working, 'it just doesn't last long enough'.  Husband and coworkers have commented.  ROS:   See pertinent positives and negatives per HPI.  Patient Active Problem List   Diagnosis Date Noted  . ADD (attention deficit disorder) 07/27/2019  . Eustachian tube dysfunction, bilateral 03/30/2018  . Anemia   . Hypotension 12/18/2015  . Acute kidney injury (HCC) 12/18/2015  . Nausea and vomiting 12/18/2015  . Chest pain 12/18/2015  . Anxiety   . Dehydration   . Depression 08/08/2015  . Acute maxillary sinusitis 02/14/2015  . Atrial tachycardia (HCC) 08/26/2013  . Palpitations 08/26/2013  . Acute bronchitis 06/15/2013  . Atrial fibrillation with RVR (HCC)   . DEPRESSION 11/15/2008  . ALLERGIC RHINITIS 11/15/2008  . FATIGUE 11/15/2008  . HEADACHE 11/15/2008  . 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 COLONIC POLYPS, HX OF 11/15/2008    Social History   Tobacco Use  . Smoking status: Never Smoker  . Smokeless tobacco: Never Used  Substance Use Topics  . Alcohol use: Yes    Alcohol/week: 0.0 standard drinks    Comment: 12/18/2015 "might have a glass of wine 2-3 times/month"    Current Outpatient  Medications:  .  amphetamine-dextroamphetamine (ADDERALL XR) 20 MG 24 hr capsule, Take 1 capsule (20 mg total) by mouth every morning., Disp: 30 capsule, Rfl: 0 .  flecainide (TAMBOCOR) 50 MG tablet, TAKE 1 TABLET BY MOUTH TWICE A DAY, Disp: 180 tablet, Rfl: 2 .  ibuprofen (ADVIL) 200 MG tablet, Taking 4 tablets by mouth as needed for arthritis., Disp: , Rfl:  .  metoprolol succinate (TOPROL-XL) 25 MG 24 hr tablet, Take 0.5 tablets (12.5 mg total) by mouth daily., Disp: 45 tablet, Rfl: 3 .  PARoxetine (PAXIL-CR) 25 MG 24 hr tablet, TAKE 1 TABLET BY MOUTH EVERY DAY, Disp: 90 tablet, Rfl: 0 .  aspirin EC 81 MG tablet, Take 81 mg by mouth daily.  (Patient not taking: No sig reported), Disp: , Rfl:  .  fluticasone (FLONASE) 50 MCG/ACT nasal spray, Place 2 sprays into both nostrils daily as needed for allergies. Reported on 02/14/2015, Disp: 16 g, Rfl: 0  Allergies  Allergen Reactions  . Codeine Itching  . Sulfonamide Derivatives Rash    Objective:   There were no vitals taken for this visit. AAOx3, NAD NCAT, EOMI No obvious CN deficits Coloring WNL Pt is able to speak clearly, coherently without shortness of breath or increased work of breathing.  Thought process is linear.  Mood is appropriate.   Assessment and Plan:   ADHD- deteriorated.  Pt notes that her sxs are no longer controlled on current medication.  She feels that the dose is appropriate for symptom control in the morning hours but then wears off too  quickly.  Will add short acting Adderall for her to take in the afternoon for additional focus and adjust dosing as needed.  Pt expressed understanding and is in agreement w/ plan.    Neena Rhymes, MD 06/10/2020

## 2020-06-13 ENCOUNTER — Encounter: Payer: Self-pay | Admitting: Obstetrics & Gynecology

## 2020-06-13 ENCOUNTER — Ambulatory Visit (INDEPENDENT_AMBULATORY_CARE_PROVIDER_SITE_OTHER): Payer: BC Managed Care – PPO | Admitting: Obstetrics & Gynecology

## 2020-06-13 ENCOUNTER — Other Ambulatory Visit: Payer: Self-pay

## 2020-06-13 VITALS — BP 122/78 | Ht 63.5 in | Wt 148.0 lb

## 2020-06-13 DIAGNOSIS — Z780 Asymptomatic menopausal state: Secondary | ICD-10-CM | POA: Diagnosis not present

## 2020-06-13 DIAGNOSIS — Z01419 Encounter for gynecological examination (general) (routine) without abnormal findings: Secondary | ICD-10-CM | POA: Diagnosis not present

## 2020-06-13 DIAGNOSIS — Z803 Family history of malignant neoplasm of breast: Secondary | ICD-10-CM | POA: Diagnosis not present

## 2020-06-13 NOTE — Progress Notes (Signed)
Jenna Cortez 1965/12/13 [id number]   History:    55 y.o. G2P2L2 Married.  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  RP:  Established patient presenting for Annual/Gynecologic exam  HPI: Post menopause x3 years, well on no hormone replacement therapy.  No postmenopausal bleeding.  No pelvic pain.  No pain with intercourse.  Urine and bowel movements normal.  Breasts normal status post bilateral implants.  Mother with diagnosis of breast cancer at age 67.  Patient is BRCA1 and BRCA2 negative.  Body mass index 25.81.  Exercise more regularly. Patient on Paxil for anxiety.  Health labs with Dr. Birdie Riddle.  Past medical history,surgical history, family history and social history were all reviewed and documented in the EPIC chart.  Gynecologic History No LMP recorded. Patient has had an ablation.  Obstetric History OB History  Gravida Para Term Preterm AB Living  _0 SAB IAB Ectopic Multiple Live Births          2    # Outcome Date GA Lbr Len/2nd Weight Sex Delivery Anes PTL Lv  2 Term     M Vag-Spont  N LIV  1 Term     F Vag-Spont  N LIV     ROS: A ROS was performed and pertinent positives and negatives are included in the history.  GENERAL: No fevers or chills. HEENT: No change in vision, no earache, sore throat or sinus congestion. NECK: No pain or stiffness. CARDIOVASCULAR: No chest pain or pressure. No palpitations. PULMONARY: No shortness of breath, cough or wheeze. GASTROINTESTINAL: No abdominal pain, nausea, vomiting or diarrhea, melena or bright red blood per rectum. GENITOURINARY: No urinary frequency, urgency, hesitancy or dysuria. MUSCULOSKELETAL: No joint or muscle pain, no back pain, no recent trauma. DERMATOLOGIC: No rash, no itching, no lesions. ENDOCRINE: No polyuria, polydipsia, no heat or cold intolerance. No recent change in weight. HEMATOLOGICAL: No anemia or easy bruising or bleeding. NEUROLOGIC: No headache, seizures, numbness, tingling or weakness. PSYCHIATRIC: No  depression, no loss of interest in normal activity or change in sleep pattern.     Exam:   BP 122/78   Ht 5' 3.5" (1.613 m)   Wt 148 lb (67.1 kg)   BMI 25.81 kg/m   Body mass index is 25.81 kg/m.  General appearance : Well developed well nourished female. No acute distress HEENT: Eyes: no retinal hemorrhage or exudates,  Neck supple, trachea midline, no carotid bruits, no thyroidmegaly Lungs: Clear to auscultation, no rhonchi or wheezes, or rib retractions  Heart: Regular rate and rhythm, no murmurs or gallops Breast:Examined in sitting and supine position were symmetrical in appearance, no palpable masses or tenderness,  no skin retraction, no nipple inversion, no nipple discharge, no skin discoloration, no axillary or supraclavicular lymphadenopathy Abdomen: no palpable masses or tenderness, no rebound or guarding Extremities: no edema or skin discoloration or tenderness  Pelvic: Vulva: Normal             Vagina: No gross lesions or discharge  Cervix: No gross lesions or discharge  Uterus  AV, normal size, shape and consistency, non-tender and mobile  Adnexa  Without masses or tenderness  Anus: Normal   Assessment/Plan:  55 y.o. female for annual exam   1. Encounter for routine gynecological examination  Normal gynecologic exam in menopause.  Pap test negative in November 2020, will repeat a Pap test next year.  Breast exam normal status post bilateral augmentation.  Colonoscopy in  2019.  Health labs with family physician.  Weight loss with a good body mass index at 25.81 now.  Continue with fitness and healthy nutrition.  2. Postmenopause Well on no HRT.  No PMB.  Vit D supplements, Ca++ 1.5 g/d total, regular weight bearing physical activities.  3. Family history of breast cancer in mother Mother with Breast Ca at age 82.  Patient is BrCa 1-2 Negative.  Princess Bruins MD, 9:07 AM 06/13/2020

## 2020-07-21 ENCOUNTER — Telehealth: Payer: Self-pay | Admitting: Family Medicine

## 2020-07-21 DIAGNOSIS — F900 Attention-deficit hyperactivity disorder, predominantly inattentive type: Secondary | ICD-10-CM

## 2020-07-21 MED ORDER — AMPHETAMINE-DEXTROAMPHET ER 20 MG PO CP24: 20.0000 mg | ORAL_CAPSULE | ORAL | 0 refills | Status: DC

## 2020-07-21 NOTE — Telephone Encounter (Signed)
Requesting: Adderall 20mg  XR  Contract: UDS: Last Visit:06/10/20 v/v Next Visit:n/a Last Refill:06/04/10 30 tabs 0 refills  Please Advise

## 2020-07-21 NOTE — Telephone Encounter (Signed)
Pt called in asking for a refill on the Adderall 20mg  [facility] to the [facility]

## 2020-07-21 NOTE — Telephone Encounter (Signed)
Prescription filled.

## 2020-07-24 ENCOUNTER — Other Ambulatory Visit: Payer: Self-pay | Admitting: Obstetrics & Gynecology

## 2020-07-24 NOTE — Telephone Encounter (Signed)
Medication refill request: paxil 25mg  Last AEX:  06-13-20 Next AEX: 06-19-21 Last MMG (if hormonal medication request): n/a Refill authorized: please approve if appropriate

## 2020-07-30 ENCOUNTER — Encounter: Payer: Self-pay | Admitting: *Deleted

## 2020-08-08 ENCOUNTER — Telehealth: Payer: Self-pay

## 2020-08-08 DIAGNOSIS — F900 Attention-deficit hyperactivity disorder, predominantly inattentive type: Secondary | ICD-10-CM

## 2020-08-08 MED ORDER — AMPHETAMINE-DEXTROAMPHET ER 20 MG PO CP24: 20.0000 mg | ORAL_CAPSULE | ORAL | 0 refills | Status: DC

## 2020-08-08 NOTE — Telephone Encounter (Signed)
Pt has been trying to get medication for amphetamine-dextroamphetamine (ADDERALL XR) 20 MG 24 hr capsule [[id number]] [facility] and they never got the order in or meds so she wants medication resent to [facility]   Pt call back [phone]

## 2020-08-08 NOTE — Telephone Encounter (Signed)
Prescription sent to [facility]

## 2020-09-11 ENCOUNTER — Other Ambulatory Visit: Payer: Self-pay

## 2020-09-11 ENCOUNTER — Telehealth: Payer: Self-pay

## 2020-09-11 DIAGNOSIS — F900 Attention-deficit hyperactivity disorder, predominantly inattentive type: Secondary | ICD-10-CM

## 2020-09-11 MED ORDER — AMPHETAMINE-DEXTROAMPHET ER 20 MG PO CP24: 20.0000 mg | ORAL_CAPSULE | ORAL | 0 refills | Status: DC

## 2020-09-11 MED ORDER — AMPHETAMINE-DEXTROAMPHETAMINE 20 MG PO TABS: 20.0000 mg | ORAL_TABLET | Freq: Every day | ORAL | 0 refills | Status: DC

## 2020-09-11 NOTE — Telephone Encounter (Signed)
Pt needs refill on amphetamine-dextroamphetamine (ADDERALL XR) 20 MG 24 hr capsule [[id number]]   amphetamine-dextroamphetamine (ADDERALL) 20 MG tablet [[id number]]   [id number] [place], [place] - [address]

## 2020-09-11 NOTE — Telephone Encounter (Signed)
Request sent to provider for approval.  

## 2020-09-11 NOTE — Telephone Encounter (Signed)
Pt needs refill on amphetamine-dextroamphetamine (ADDERALL XR) 20 MG 24 hr capsule [[id number]]    amphetamine-dextroamphetamine (ADDERALL) 20 MG tablet [[id number]]    [id number] [place], [place] - [address]       Note   XR 20mg  was filled 08/08/20 #30 with no refills 20mg  was filled 06/10/20 #30 with no refills LOV 06/10/20 NOV none

## 2020-09-19 ENCOUNTER — Telehealth: Payer: Self-pay | Admitting: *Deleted

## 2020-09-19 NOTE — Telephone Encounter (Signed)
Patient called stating she still having pain with intercourse due to vaginal dryness, she tried the coconut oil and report that did not help at all. She asked if a HRT type medication would help? Please advise

## 2020-09-25 NOTE — Telephone Encounter (Signed)
Patient called to follow up on message she left last week. I did inform her that it is with Dr. Seymour Bars awaiting her recommendation and as soon as we hear we will call her.

## 2020-09-26 MED ORDER — ESTRADIOL 10 MCG VA TABS: 10.0000 ug | ORAL_TABLET | VAGINAL | 0 refills | Status: DC

## 2020-09-26 NOTE — Telephone Encounter (Signed)
Recommend trying Vagifem twice a week.  Can schedule a visit if wants counseling on it.  Also, if no improvement after 3 months, will need a visit.

## 2020-09-26 NOTE — Telephone Encounter (Signed)
Patient aware Rx sent.  

## 2020-10-23 ENCOUNTER — Other Ambulatory Visit: Payer: Self-pay

## 2020-10-23 DIAGNOSIS — F900 Attention-deficit hyperactivity disorder, predominantly inattentive type: Secondary | ICD-10-CM

## 2020-10-23 NOTE — Telephone Encounter (Signed)
Pt needs refill on amphetamine-dextroamphetamine (ADDERALL XR) 20 MG 24 hr capsule [[id number]] ,  amphetamine-dextroamphetamine (ADDERALL) 20 MG tablet [[id number]]   [id number] [place], [place] - [address]   Pt call back [phone]

## 2020-10-23 NOTE — Telephone Encounter (Signed)
Requesting:Adderral XR 20mg  Contract: UDS: Last Visit:06/10/20 Next Visit:n/a Last Refill:09/11/20 30 tabs 0 refills  Please Advise

## 2020-10-24 MED ORDER — AMPHETAMINE-DEXTROAMPHET ER 20 MG PO CP24: 20.0000 mg | ORAL_CAPSULE | ORAL | 0 refills | Status: DC

## 2020-10-24 MED ORDER — AMPHETAMINE-DEXTROAMPHETAMINE 20 MG PO TABS: 20.0000 mg | ORAL_TABLET | Freq: Every day | ORAL | 0 refills | Status: DC

## 2020-11-14 ENCOUNTER — Ambulatory Visit (HOSPITAL_COMMUNITY): Payer: BC Managed Care – PPO | Admitting: Physician Assistant

## 2020-11-27 NOTE — Progress Notes (Incomplete)
Primary Care Physician: Midge Minium, MD Referring Physician: Dr. Casilda Carls is a 55 y.o. female with a h/o afib, on flecainide who presents in the [facility] for follow up. She has a CHA2DS2VASc score of 1. ***  Today, she denies symptoms of ***palpitations, chest pain, shortness of breath, orthopnea, PND, lower extremity edema, dizziness, presyncope, syncope, or neurologic sequela. The patient is tolerating medications without difficulties and is otherwise without complaint today.   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    NEGATIVE MUTATION 01/05/06   Depression    Dysplasia of cervix, unspecified    Herpes    Hyperprolactinemia (HCC)    Insomnia    Migraine    "2-3/year" (12/18/2015)   Paroxysmal atrial fibrillation (HCC)    TMJ (dislocation of temporomandibular joint)    Past Surgical History:  Procedure Laterality Date   AUGMENTATION MAMMAPLASTY Bilateral 1998   BREAST BIOPSY Bilateral 2001-2003   RIGHT BREAST BIOPSY 2001-PAPILOMA/LT BR BXY-BENIGN 2003   CERVIX LESION DESTRUCTION  ~ [place] Right ~ 2008   EYE SURGERY     FRACTURE SURGERY     HYSTEROSCOPY N/A 07/27/2013   Procedure: HYSTEROSCOPY WITH HYDROTHERMAL ABLATION;  Surgeon: Terrance Mass, MD;  Location: [facility];  Service: Gynecology;  Laterality: N/A;   LAPAROSCOPIC CHOLECYSTECTOMY  2011   ORIF CLAVICLE FRACTURE Right ~ 2008   CLAVICLE FRACTURE-TRAUMATIC   REFRACTIVE SURGERY Bilateral 1990s   SHOULDER ARTHROSCOPY Right 2016   "labrium cartilage repair"   SHOULDER ARTHROSCOPY WITH ROTATOR CUFF REPAIR Right 2000   TRIGGER FINGER RELEASE Right    thumb   tube in ear Left     Current Outpatient Medications  Medication Sig Dispense Refill   amphetamine-dextroamphetamine (ADDERALL XR) 20 MG 24 hr capsule Take 1 capsule (20 mg total) by mouth every morning. 30  capsule 0   amphetamine-dextroamphetamine (ADDERALL) 20 MG tablet Take 1 tablet (20 mg total) by mouth daily. In the afternoon for additional focus 30 tablet 0   aspirin EC 81 MG tablet Take 81 mg by mouth daily.     Estradiol 10 MCG TABS vaginal tablet Place 1 tablet (10 mcg total) vaginally 2 (two) times a week. 24 tablet 0   flecainide (TAMBOCOR) 50 MG tablet TAKE 1 TABLET BY MOUTH TWICE A DAY 180 tablet 2   fluticasone (FLONASE) 50 MCG/ACT nasal spray Place 2 sprays into both nostrils daily as needed for allergies. Reported on 02/14/2015 16 g 0   ibuprofen (ADVIL) 200 MG tablet Taking 4 tablets by mouth as needed for arthritis.     metoprolol succinate (TOPROL-XL) 25 MG 24 hr tablet Take 0.5 tablets (12.5 mg total) by mouth daily. 45 tablet 3   PARoxetine (PAXIL-CR) 25 MG 24 hr tablet TAKE 1 TABLET BY MOUTH EVERY DAY 90 tablet 0   No current facility-administered medications for this visit.    Allergies  Allergen Reactions   Codeine Itching   Sulfonamide Derivatives Rash    Social History   Socioeconomic History   Marital status: Married    Spouse name: Not on file   Number of children: Not on file   Years of education: Not on file   Highest education level: Not on file  Occupational History   Not on file  Tobacco Use   Smoking status: Never  Smokeless tobacco: Never  Vaping Use   Vaping Use: Never used  Substance and Sexual Activity   Alcohol use: Yes    Alcohol/week: 0.0 standard drinks    Comment: 12/18/2015 "might have a glass of wine 2-3 times/month"   Drug use: No   Sexual activity: Yes    Partners: Male    Birth control/protection: Condom    Comment: 1st intercourse- 34, partners- 66, married- 9 yrs  Other Topics Concern   Not on file  Social History Narrative   Not on file   Social Determinants of Health   Financial Resource Strain: Not on file  Food Insecurity: Not on file  Transportation Needs: Not on file  Physical Activity: Not on file  Stress: Not  on file  Social Connections: Not on file  Intimate Partner Violence: Not on file    Family History  Problem Relation Age of Onset   Breast cancer Mother 59   Hypertension Father    Atrial fibrillation Brother     ROS- All systems are reviewed and negative except as per the HPI above  Physical Exam: There were no vitals filed for this visit.  Wt Readings from Last 3 Encounters:  06/13/20 67.1 kg  10/05/19 71.9 kg  12/11/18 71.7 kg    Labs: Lab Results  Component Value Date   NA 140 01/16/2016   K 4.6 01/16/2016   CL 103 01/16/2016   CO2 31 01/16/2016   GLUCOSE 83 01/16/2016   BUN 17 01/16/2016   CREATININE 0.92 01/16/2016   CALCIUM 9.6 01/16/2016   Lab Results  Component Value Date   INR 1.18 04/01/2011   Lab Results  Component Value Date   CHOL 161 08/22/2015   HDL 77 08/22/2015   LDLCALC 69 08/22/2015   TRIG 74 08/22/2015    GEN- The patient is a well appearing *** {Desc; female/female:11659}, alert and oriented x 3 today.   HEENT-head normocephalic, atraumatic, sclera clear, conjunctiva pink, hearing intact, trachea midline. Lungs- Clear to ausculation bilaterally, normal work of breathing Heart- ***Regular rate and rhythm, no murmurs, rubs or gallops  GI- soft, NT, ND, + BS Extremities- no clubbing, cyanosis, or edema MS- no significant deformity or atrophy Skin- no rash or lesion Psych- euthymic mood, full affect Neuro- strength and sensation are intact   EKG- ***    Assessment and Plan: 1. Paroxysmal atrial fibrillation   *** Continue flecainide 50 mg BID Continue metoprolol 12.5 mg daily   2. CHA2DS2VASc score of 1 Continue asa daily Anticoagulation not indicated at this time. ***    Follow up ***   Adline Peals PA-C Afib [facility] [address] [place], [place] [address] [phone]

## 2020-11-28 ENCOUNTER — Encounter (HOSPITAL_COMMUNITY): Payer: Self-pay

## 2020-11-28 ENCOUNTER — Ambulatory Visit (HOSPITAL_COMMUNITY): Payer: BC Managed Care – PPO | Admitting: Physician Assistant

## 2020-11-28 ENCOUNTER — Telehealth: Payer: Self-pay | Admitting: Family Medicine

## 2020-11-28 DIAGNOSIS — F900 Attention-deficit hyperactivity disorder, predominantly inattentive type: Secondary | ICD-10-CM

## 2020-11-28 MED ORDER — AMPHETAMINE-DEXTROAMPHET ER 20 MG PO CP24: 20.0000 mg | ORAL_CAPSULE | ORAL | 0 refills | Status: DC

## 2020-11-28 NOTE — Telephone Encounter (Signed)
Prescription sent

## 2020-11-28 NOTE — Telephone Encounter (Signed)
..  Caller name:  Deon Ivey  Caller callback [phone]  Encourage patient to contact the pharmacy for refills or they can request refills through [facility]  (Please schedule appointment if patient has not been seen in over a year)  MEDICATION NAME & DOSE:  Adderall XR  Notes/Comments from patient: Patient would like to switch her pharmacy to "[facility] in [place]".  WHAT PHARMACY WOULD THEY LIKE THIS SENT TO:   [facility] in [place]  Please notify patient: It takes 48-72 hours to process rx refill requests Ask patient to call pharmacy to ensure rx is ready before heading there.   (CLINICAL TO FILL OR ROUTE PER PROTOCOLS)

## 2020-12-02 ENCOUNTER — Other Ambulatory Visit: Payer: Self-pay | Admitting: Family Medicine

## 2020-12-02 ENCOUNTER — Other Ambulatory Visit: Payer: Self-pay | Admitting: Otolaryngology

## 2020-12-02 DIAGNOSIS — F900 Attention-deficit hyperactivity disorder, predominantly inattentive type: Secondary | ICD-10-CM

## 2020-12-02 DIAGNOSIS — R221 Localized swelling, mass and lump, neck: Secondary | ICD-10-CM

## 2020-12-02 MED ORDER — AMPHETAMINE-DEXTROAMPHET ER 20 MG PO CP24: 20.0000 mg | ORAL_CAPSULE | ORAL | 0 refills | Status: DC

## 2020-12-02 NOTE — Progress Notes (Signed)
Prescription sent to pts requested pharmacy.

## 2020-12-04 ENCOUNTER — Ambulatory Visit
Admission: RE | Admit: 2020-12-04 | Discharge: 2020-12-04 | Disposition: A | Discharge status: Home / Self Care | Payer: BC Managed Care – PPO | Source: Ambulatory Visit | Attending: Otolaryngology | Admitting: Otolaryngology

## 2020-12-04 ENCOUNTER — Other Ambulatory Visit: Payer: Self-pay

## 2020-12-04 DIAGNOSIS — R221 Localized swelling, mass and lump, neck: Secondary | ICD-10-CM

## 2020-12-04 MED ORDER — IOPAMIDOL (ISOVUE-300) INJECTION 61%
75.0000 mL | Freq: Once | INTRAVENOUS | Status: AC | PRN
  Administered 2020-12-04: 75 mL via INTRAVENOUS

## 2020-12-10 ENCOUNTER — Other Ambulatory Visit: Payer: Self-pay | Admitting: Obstetrics & Gynecology

## 2020-12-31 ENCOUNTER — Other Ambulatory Visit (HOSPITAL_COMMUNITY): Payer: Self-pay | Admitting: Nurse Practitioner

## 2021-01-02 ENCOUNTER — Ambulatory Visit (HOSPITAL_COMMUNITY): Payer: BC Managed Care – PPO | Admitting: Physician Assistant

## 2021-01-16 ENCOUNTER — Other Ambulatory Visit: Payer: Self-pay

## 2021-01-16 ENCOUNTER — Ambulatory Visit (HOSPITAL_COMMUNITY)
Admission: RE | Admit: 2021-01-16 | Discharge: 2021-01-16 | Disposition: A | Discharge status: Home / Self Care | Payer: BC Managed Care – PPO | Source: Ambulatory Visit | Attending: Physician Assistant | Admitting: Physician Assistant

## 2021-01-16 VITALS — BP 120/66 | HR 74 | Ht 63.5 in | Wt 157.6 lb

## 2021-01-16 DIAGNOSIS — I480 Paroxysmal atrial fibrillation: Secondary | ICD-10-CM | POA: Insufficient documentation

## 2021-01-16 DIAGNOSIS — Z7982 Long term (current) use of aspirin: Secondary | ICD-10-CM | POA: Diagnosis not present

## 2021-01-16 DIAGNOSIS — Z79899 Other long term (current) drug therapy: Secondary | ICD-10-CM | POA: Diagnosis not present

## 2021-01-16 DIAGNOSIS — Z636 Dependent relative needing care at home: Secondary | ICD-10-CM | POA: Insufficient documentation

## 2021-01-16 DIAGNOSIS — R9431 Abnormal electrocardiogram [ECG] [EKG]: Secondary | ICD-10-CM | POA: Insufficient documentation

## 2021-01-16 NOTE — Progress Notes (Signed)
Primary Care Physician: Midge Minium, MD Referring Physician: Dr. Casilda Carls is a 55 y.o. female with a h/o afib, on flecainide for yearly f/u. She is on asa for a CHA2DS2VASc score of 1 for female. On follow up today, patient reports that she has done well since her last visit. She has rare and very brief palpitations, nothing sustained. Tolerating the medications without issue. She is caring for her father with COPD who is on hospice.   Today, she denies symptoms of chest pain, shortness of breath, orthopnea, PND, lower extremity edema, dizziness, presyncope, syncope, or neurologic sequela. The patient is tolerating medications without difficulties and is otherwise without complaint today.   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    NEGATIVE MUTATION 01/05/06   Depression    Dysplasia of cervix, unspecified    Herpes    Hyperprolactinemia (HCC)    Insomnia    Migraine    "2-3/year" (12/18/2015)   Paroxysmal atrial fibrillation (HCC)    TMJ (dislocation of temporomandibular joint)    Past Surgical History:  Procedure Laterality Date   AUGMENTATION MAMMAPLASTY Bilateral 1998   BREAST BIOPSY Bilateral 2001-2003   RIGHT BREAST BIOPSY 2001-PAPILOMA/LT BR BXY-BENIGN 2003   CERVIX LESION DESTRUCTION  ~ [place] Right ~ 2008   EYE SURGERY     FRACTURE SURGERY     HYSTEROSCOPY N/A 07/27/2013   Procedure: HYSTEROSCOPY WITH HYDROTHERMAL ABLATION;  Surgeon: Terrance Mass, MD;  Location: [facility];  Service: Gynecology;  Laterality: N/A;   LAPAROSCOPIC CHOLECYSTECTOMY  2011   ORIF CLAVICLE FRACTURE Right ~ 2008   CLAVICLE FRACTURE-TRAUMATIC   REFRACTIVE SURGERY Bilateral 1990s   SHOULDER ARTHROSCOPY Right 2016   "labrium cartilage repair"   SHOULDER ARTHROSCOPY WITH ROTATOR CUFF REPAIR Right 2000   TRIGGER FINGER RELEASE Right    thumb   tube in ear Left      Current Outpatient Medications  Medication Sig Dispense Refill   amphetamine-dextroamphetamine (ADDERALL XR) 20 MG 24 hr capsule Take 1 capsule (20 mg total) by mouth every morning. 30 capsule 0   amphetamine-dextroamphetamine (ADDERALL) 20 MG tablet Take 1 tablet (20 mg total) by mouth daily. In the afternoon for additional focus 30 tablet 0   aspirin EC 81 MG tablet Take 81 mg by mouth daily.     cyanocobalamin 1000 MCG tablet Take 1,000 mcg by mouth as needed.     ELDERBERRY PO Take by mouth. Takes 2 chewables by mouth daily     Estradiol 10 MCG TABS vaginal tablet PLACE 1 TABLET VAGINALLY 2 TIMES A WEEK. 8 tablet 5   flecainide (TAMBOCOR) 50 MG tablet TAKE 1 TABLET BY MOUTH TWICE A DAY 180 tablet 2   fluticasone (FLONASE) 50 MCG/ACT nasal spray Place 2 sprays into both nostrils daily as needed for allergies. Reported on 02/14/2015 16 g 0   ibuprofen (ADVIL) 200 MG tablet Taking 4 tablets by mouth as needed for arthritis.     metoprolol succinate (TOPROL-XL) 25 MG 24 hr tablet Take 0.5 tablets (12.5 mg total) by mouth daily. 15 tablet 0   Multiple Vitamin (MULTIVITAMIN) tablet Take 1 tablet by mouth daily.     PARoxetine (PAXIL-CR) 25 MG 24 hr tablet TAKE 1 TABLET BY MOUTH EVERY DAY 90 tablet 0   No current facility-administered medications for this encounter.    Allergies  Allergen Reactions   Codeine Itching   Other Other (See Comments)   Sulfonamide Derivatives Rash    Social History   Socioeconomic History   Marital status: Married    Spouse name: Not on file   Number of children: Not on file   Years of education: Not on file   Highest education level: Not on file  Occupational History   Not on file  Tobacco Use   Smoking status: Never   Smokeless tobacco: Never  Vaping Use   Vaping Use: Never used  Substance and Sexual Activity   Alcohol use: Yes    Alcohol/week: 0.0 standard drinks    Comment: 12/18/2015 "might have a glass of wine 2-3 times/month"   Drug  use: No   Sexual activity: Yes    Partners: Male    Birth control/protection: Condom    Comment: 1st intercourse- 16, partners- 30, married- 30 yrs  Other Topics Concern   Not on file  Social History Narrative   Not on file   Social Determinants of Health   Financial Resource Strain: Not on file  Food Insecurity: Not on file  Transportation Needs: Not on file  Physical Activity: Not on file  Stress: Not on file  Social Connections: Not on file  Intimate Partner Violence: Not on file    Family History  Problem Relation Age of Onset   Breast cancer Mother 58   Hypertension Father    Atrial fibrillation Brother     ROS- All systems are reviewed and negative except as per the HPI above  Physical Exam: Vitals:   01/16/21 1007  BP: 120/66  Pulse: 74  Weight: 71.5 kg  Height: 5' 3.5" (1.613 m)    Wt Readings from Last 3 Encounters:  01/16/21 71.5 kg  06/13/20 67.1 kg  10/05/19 71.9 kg    Labs: Lab Results  Component Value Date   NA 140 01/16/2016   K 4.6 01/16/2016   CL 103 01/16/2016   CO2 31 01/16/2016   GLUCOSE 83 01/16/2016   BUN 17 01/16/2016   CREATININE 0.92 01/16/2016   CALCIUM 9.6 01/16/2016   Lab Results  Component Value Date   INR 1.18 04/01/2011   Lab Results  Component Value Date   CHOL 161 08/22/2015   HDL 77 08/22/2015   LDLCALC 69 08/22/2015   TRIG 74 08/22/2015   GEN- The patient is a well appearing female, alert and oriented x 3 today.   HEENT-head normocephalic, atraumatic, sclera clear, conjunctiva pink, hearing intact, trachea midline. Lungs- Clear to ausculation bilaterally, normal work of breathing Heart- Regular rate and rhythm, no murmurs, rubs or gallops  GI- soft, NT, ND, + BS Extremities- no clubbing, cyanosis, or edema MS- no significant deformity or atrophy Skin- no rash or lesion Psych- euthymic mood, full affect Neuro- strength and sensation are intact   EKG- SR Vent. rate 74 BPM PR interval 138 ms QRS duration  74 ms QT/QTcB 376/417 ms    Assessment and Plan: 1. Paroxysmal  afib  Patient appears to be maintaining SR.  Continue flecainide 50 mg bid  She will continue metoprolol 12.5 mg qd  Anticoagulation not indicated at this time with low CV score.   2. CHA2DS2VASc score of 1   Follow up in the AF clinic in one year.    [facility] [address] [place], [place] [address] [phone]

## 2021-01-19 ENCOUNTER — Other Ambulatory Visit (HOSPITAL_COMMUNITY): Payer: Self-pay | Admitting: Nurse Practitioner

## 2021-01-28 ENCOUNTER — Other Ambulatory Visit: Payer: Self-pay

## 2021-01-28 ENCOUNTER — Telehealth: Payer: Self-pay | Admitting: Family Medicine

## 2021-01-28 MED ORDER — PAROXETINE HCL ER 25 MG PO TB24: 25.0000 mg | ORAL_TABLET | Freq: Every day | ORAL | 0 refills | Status: DC

## 2021-01-28 NOTE — Telephone Encounter (Signed)
Corrected and sent

## 2021-01-28 NOTE — Telephone Encounter (Signed)
Patient is aware 

## 2021-01-28 NOTE — Telephone Encounter (Signed)
Pt called in asking for a new script of the Paroxetine to be sent to [facility] in [place].   [facility] [address]   Please update the pharmacy.

## 2021-02-19 ENCOUNTER — Other Ambulatory Visit: Payer: Self-pay | Admitting: Family Medicine

## 2021-02-20 ENCOUNTER — Telehealth: Payer: Self-pay | Admitting: Family Medicine

## 2021-02-20 MED ORDER — PAROXETINE HCL ER 25 MG PO TB24
25.0000 mg | ORAL_TABLET | Freq: Every day | ORAL | 0 refills | Status: DC
Start: 2021-02-20 — End: 2021-06-10

## 2021-02-20 NOTE — Telephone Encounter (Signed)
Patient is aware 

## 2021-02-20 NOTE — Telephone Encounter (Signed)
I am so sorry for her loss.  I will send in a full 30 day refill to the pharmacy and she can have them dispense however she thinks she needs or is able to pay for

## 2021-02-20 NOTE — Telephone Encounter (Signed)
Pt called in asking if Dr. Birdie Riddle would be willing to call in 4 or 5 pills of the Paroxetine. She states that he is misplaced her script. She states that her dad just passed and she isn't sure what she has done with it. She just wanted a couple of pills to give her time to look.  Please advise

## 2021-02-26 ENCOUNTER — Other Ambulatory Visit: Payer: Self-pay

## 2021-02-26 DIAGNOSIS — F900 Attention-deficit hyperactivity disorder, predominantly inattentive type: Secondary | ICD-10-CM

## 2021-02-26 MED ORDER — AMPHETAMINE-DEXTROAMPHETAMINE 20 MG PO TABS: 20.0000 mg | ORAL_TABLET | Freq: Every day | ORAL | 0 refills | Status: DC

## 2021-02-26 MED ORDER — AMPHETAMINE-DEXTROAMPHET ER 20 MG PO CP24: 20.0000 mg | ORAL_CAPSULE | ORAL | 0 refills | Status: DC

## 2021-02-26 NOTE — Telephone Encounter (Signed)
Caller name:Jacari Chantae Soo callback [phone]  Encourage patient to contact the pharmacy for refills or they can request refills through [facility]  (Please schedule appointment if patient has not been seen in over a year) amphetamine-dextroamphetamine (ADDERALL XR) 20 MG 24 hr capsule [[id number]]  MEDICATION NAME & DOSE:  Notes/Comments from patient:[id number] - [place], [place] - [address]   WHAT PHARMACY WOULD THEY LIKE THIS SENT TO:   Please notify patient: It takes 48-72 hours to process rx refill requests Ask patient to call pharmacy to ensure rx is ready before heading there.   (CLINICAL TO FILL OR ROUTE PER PROTOCOLS)

## 2021-02-26 NOTE — Telephone Encounter (Signed)
Patient is requesting a refill of the following medications: Requested Prescriptions   Pending Prescriptions Disp Refills   amphetamine-dextroamphetamine (ADDERALL XR) 20 MG 24 hr capsule 30 capsule 0    Sig: Take 1 capsule (20 mg total) by mouth every morning.   amphetamine-dextroamphetamine (ADDERALL) 20 MG tablet 30 tablet 0    Sig: Take 1 tablet (20 mg total) by mouth daily. In the afternoon for additional focus    Date of patient request: 02/26/21 Last office visit: 06/10/20 Date of last refill: 12/02/20, 10/24/20 Last refill amount: 30

## 2021-03-08 ENCOUNTER — Other Ambulatory Visit: Payer: Self-pay | Admitting: Cardiovascular Disease

## 2021-03-09 NOTE — Telephone Encounter (Signed)
This is a A-Fib clinic pt 

## 2021-03-12 ENCOUNTER — Other Ambulatory Visit: Payer: Self-pay | Admitting: Obstetrics & Gynecology

## 2021-03-12 DIAGNOSIS — Z1231 Encounter for screening mammogram for malignant neoplasm of breast: Secondary | ICD-10-CM

## 2021-03-26 ENCOUNTER — Ambulatory Visit: Payer: BC Managed Care – PPO

## 2021-03-27 ENCOUNTER — Ambulatory Visit: Payer: BC Managed Care – PPO

## 2021-04-03 ENCOUNTER — Ambulatory Visit: Payer: BC Managed Care – PPO

## 2021-04-15 ENCOUNTER — Telehealth: Payer: Self-pay

## 2021-04-15 DIAGNOSIS — F900 Attention-deficit hyperactivity disorder, predominantly inattentive type: Secondary | ICD-10-CM

## 2021-04-15 MED ORDER — AMPHETAMINE-DEXTROAMPHET ER 20 MG PO CP24: 20.0000 mg | ORAL_CAPSULE | ORAL | 0 refills | Status: DC

## 2021-04-15 NOTE — Telephone Encounter (Signed)
Will refill but pt needs to schedule CPE ?

## 2021-04-15 NOTE — Telephone Encounter (Signed)
Sent a my chart message.

## 2021-04-15 NOTE — Telephone Encounter (Signed)
Encourage patient to contact the pharmacy for refills or they can request refills through [facility] ? ?(Please schedule appointment if patient has not been seen in over a year) ? ? ? ?WHAT PHARMACY WOULD THEY LIKE THIS SENT TO: [facility] - [place], [place] - [address]  ? ?MEDICATION NAME & DOSE:amphetamine-dextroamphetamine (ADDERALL XR) 20 MG 24 hr capsule [[id number]]  ? ?NOTES/COMMENTS FROM PATIENT: ? ? ? ? ? ?Front office please notify patient: ?It takes 48-72 hours to process rx refill requests ?Ask patient to call pharmacy to ensure rx is ready before heading there.  ? ?

## 2021-04-24 ENCOUNTER — Ambulatory Visit
Admission: RE | Admit: 2021-04-24 | Discharge: 2021-04-24 | Disposition: A | Discharge status: Home / Self Care | Payer: BC Managed Care – PPO | Source: Ambulatory Visit | Attending: Obstetrics & Gynecology | Admitting: Obstetrics & Gynecology

## 2021-04-24 DIAGNOSIS — Z1231 Encounter for screening mammogram for malignant neoplasm of breast: Secondary | ICD-10-CM

## 2021-06-10 ENCOUNTER — Other Ambulatory Visit: Payer: Self-pay

## 2021-06-10 DIAGNOSIS — I9589 Other hypotension: Secondary | ICD-10-CM

## 2021-06-10 MED ORDER — PAROXETINE HCL ER 25 MG PO TB24: 25.0000 mg | ORAL_TABLET | Freq: Every day | ORAL | 0 refills | Status: DC

## 2021-06-19 ENCOUNTER — Ambulatory Visit: Payer: BC Managed Care – PPO | Admitting: Obstetrics & Gynecology

## 2021-06-30 ENCOUNTER — Telehealth: Payer: Self-pay

## 2021-06-30 NOTE — Telephone Encounter (Signed)
Given to Dr. Beverely Low to review. Once reviewed the [facility] visit notes will be scanned in to the chart.

## 2021-07-02 ENCOUNTER — Encounter: Payer: Self-pay | Admitting: Family Medicine

## 2021-07-02 ENCOUNTER — Ambulatory Visit: Payer: BC Managed Care – PPO | Admitting: Family Medicine

## 2021-07-02 VITALS — BP 130/88 | HR 85 | Temp 98.4°F | Resp 18 | Wt 155.0 lb

## 2021-07-02 DIAGNOSIS — J209 Acute bronchitis, unspecified: Secondary | ICD-10-CM | POA: Diagnosis not present

## 2021-07-02 MED ORDER — AZITHROMYCIN 250 MG PO TABS: ORAL_TABLET | ORAL | 0 refills | Status: DC

## 2021-07-02 MED ORDER — ALBUTEROL SULFATE HFA 108 (90 BASE) MCG/ACT IN AERS: 2.0000 | INHALATION_SPRAY | Freq: Four times a day (QID) | RESPIRATORY_TRACT | 0 refills | Status: DC | PRN

## 2021-07-02 MED ORDER — PREDNISONE 10 MG PO TABS: ORAL_TABLET | ORAL | 0 refills | Status: DC

## 2021-07-02 NOTE — Patient Instructions (Signed)
Follow up as needed or as scheduled START the Zpack today as directed START the Prednisone taper tomorrow w/ breakfast- 3 pills at the same time USE the Albuterol inhaler 3x/day until feeling better and as needed for coughing Use the cough syrup and Tessalon together for better relief LOTS of fluids LOTS of rest Call with any questions or concerns Hang in there!!!

## 2021-07-02 NOTE — Progress Notes (Signed)
   Subjective:    Patient ID: Jenna Cortez, female    DOB: [birth year], 56 y.o.   MRN: [id number]  HPI URI- pt was first seen at [facility] on 5/27 and dx'd w/ viral syndrome.  Had virtual visit on 5/28 due to worsening sxs and was prescribed Augmentin and Medrol dose pack.  On 5/30 was given Albuterol inhaler at another UC visit.  CXR done at this time showed viral bronchitis.  Was told to stop Augmentin which she did on Tuesday 5/30.  Finishes steroids tomorrow.  No improvement w/ Albuterol inhaler.  Has Promethazine cough syrup and Tessalon available.   Review of Systems For ROS see HPI     Objective:   Physical Exam Vitals reviewed.  Constitutional:      General: She is not in acute distress.    Appearance: Normal appearance. She is well-developed. She is not ill-appearing.  HENT:     Head: Normocephalic and atraumatic.  Eyes:     Conjunctiva/sclera: Conjunctivae normal.     Pupils: Pupils are equal, round, and reactive to light.  Cardiovascular:     Rate and Rhythm: Normal rate and regular rhythm.     Heart sounds: Normal heart sounds. No murmur heard. Pulmonary:     Effort: Pulmonary effort is normal. No respiratory distress.     Breath sounds: Normal breath sounds. No wheezing or rhonchi.     Comments: Near continuous hacking cough Musculoskeletal:     Cervical back: Normal range of motion and neck supple.  Lymphadenopathy:     Cervical: No cervical adenopathy.  Skin:    General: Skin is warm and dry.  Neurological:     General: No focal deficit present.     Mental Status: She is alert and oriented to person, place, and time.  Psychiatric:        Mood and Affect: Mood normal.        Behavior: Behavior normal.        Thought Content: Thought content normal.          Assessment & Plan:  Bronchitis- new.  Pt had 3 visits prior to today's visit since 5/27.  Reviewed UC and telehealth notes.  Reviewed xray.  Pt's sxs and CXR consistent w/ bronchitis.  Will start Zpack to  cover for atypicals and for the anti-inflammatory properties.  Will continue steroid taper w/ Prednisone starting tomorrow.  Refill provided on inhaler w/ instructions on appropriate use.  Reviewed supportive care and red flags that should prompt return.  Pt expressed understanding and is in agreement w/ plan.

## 2021-07-03 ENCOUNTER — Encounter: Payer: BC Managed Care – PPO | Admitting: Family Medicine

## 2021-07-05 ENCOUNTER — Encounter: Payer: Self-pay | Admitting: Internal Medicine

## 2021-07-05 ENCOUNTER — Telehealth: Payer: Self-pay | Admitting: Internal Medicine

## 2021-07-05 DIAGNOSIS — R053 Chronic cough: Secondary | ICD-10-CM | POA: Insufficient documentation

## 2021-07-05 NOTE — Telephone Encounter (Addendum)
Pt being referred by Dr Lysbeth Penner for refractory cough  - let's see when she can come in this week - the only day we can see s adding her on is Thursday and she works in [employer] office so may not all that flexible but if push comes to double book her at 1130 or 430 and let her know there may be a wait depending on no shows/cancellations that day

## 2021-07-06 ENCOUNTER — Encounter: Payer: Self-pay | Admitting: Internal Medicine

## 2021-07-06 ENCOUNTER — Ambulatory Visit: Payer: BC Managed Care – PPO | Admitting: Internal Medicine

## 2021-07-06 DIAGNOSIS — J45991 Cough variant asthma: Secondary | ICD-10-CM

## 2021-07-06 MED ORDER — HYDROCOD POLI-CHLORPHE POLI ER 10-8 MG/5ML PO SUER: 5.0000 mL | Freq: Two times a day (BID) | ORAL | 0 refills | Status: DC | PRN

## 2021-07-06 MED ORDER — MOMETASONE FURO-FORMOTEROL FUM 100-5 MCG/ACT IN AERO: INHALATION_SPRAY | RESPIRATORY_TRACT | 1 refills | Status: DC

## 2021-07-06 NOTE — Telephone Encounter (Signed)
Spoke with pt's spouse and scheduled for appt with MW at 10:15 today and advised bring all meds.

## 2021-07-06 NOTE — Assessment & Plan Note (Signed)
Onset Jan 2020 acute but required saba short term, recurred late may 2023  This is either cough variant asthma triggered by viral uri or Upper airway cough syndrome (previously labeled PNDS),  is so named because it's frequently impossible to sort out how much is  CR/sinusitis with freq throat clearing (which can be related to primary GERD)   vs  causing  secondary (" extra esophageal")  GERD from wide swings in gastric pressure that occur with throat clearing, often  promoting self use of mint and menthol lozenges that reduce the lower esophageal sphincter tone and exacerbate the problem further in a cyclical fashion.   These are the same pts (now being labeled as having "irritable larynx syndrome" by some cough centers) who not infrequently have a history of having failed to tolerate ace inhibitors,  dry powder inhalers or biphosphonates or report having atypical/extraesophageal reflux symptoms that don't respond to standard doses of PPI  and are easily confused as having aecopd or asthma flares by even experienced allergists/ pulmonologists (myself included).  Of the three most common causes of  Sub-acute / recurrent or chronic cough, only one (GERD)  can actually contribute to/ trigger  the other two (asthma and post nasal drip syndrome)  and perpetuate the cylce of cough.  While not intuitively obvious, many patients with chronic low grade reflux do not cough until there is a primary insult that disturbs the protective epithelial barrier and exposes sensitive nerve endings.   This is typically  Triggered by viral infection  but can due to PNDS and latter  more likely toapply here.   The point is that once this occurs, it is difficult to eliminate the cycle  using anything but a maximally effective acid suppression regimen at least in the short run, accompanied by an appropriate diet to address non acid GERD and control / eliminate the cough itself for at least 3 days with tussionex >>> also   added 6  day taper off  Prednisone starting at 40 mg per day in case of component of Th-2 driven upper or lower airways inflammation and    cover for asthma with dulera 100 Take 2 puffs first thing in am and then another 2 puffs about 12 hours later until returns  - The proper method of use, as well as anticipated side effects, of a metered-dose inhaler were discussed and demonstrated to the patient using teach back method   >>> f/u 4 weeks with inhalers in hand        Each maintenance medication was reviewed in detail including emphasizing most importantly the difference between maintenance and prns and under what circumstances the prns are to be triggered using an action plan format where appropriate.  Total time for H and P, chart review, counseling, reviewing hfa  device(s) and generating customized AVS unique to this office visit / same day charting  > 45 min new pt with refractory resp symptoms of unkown origin

## 2021-07-06 NOTE — Progress Notes (Signed)
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 Assoc with sensation drainage > 4 visits  termporily better ? Albuterol neg covid x one and neg flu   History of Present Illness  07/06/2021  Pulmonary/ 1st office eval/Hasson Gaspard  Chief Complaint  Patient presents with   Pulmonary Consult    Self referral for cough. She c/o cough x 2 wks- occ prod with clear sputum. She just completed Zpack today and currently on pred taper.   Dyspnea:  feels shallow Cough: not productive  Sleep: coughing at hs  SABA use: not on day of day  Overt hb since onset of cough was not a problem prior Gen cp / tightness during severe coughing fits   No obvious day to day or daytime pattern/variability or assoc excess/ purulent sputum or mucus plugs or hemoptysis or   subjective wheeze or overt sinus symptoms.   Sleeping  without nocturnal  or early am exacerbation  of respiratory  c/o's or need for noct saba. Also denies any obvious fluctuation of symptoms with weather or environmental changes or other aggravating or alleviating factors except as outlined above   No unusual exposure hx or h/o childhood pna/ asthma or knowledge of premature birth.  Current Allergies, Complete Past Medical History, Past Surgical History, Family History, and Social History were reviewed in [employer] record.  ROS  The following are not active complaints unless bolded Hoarseness, sore throat, dysphagia, dental problems, itching, sneezing,  nasal congestion or discharge of excess mucus or purulent secretions, ear ache,   fever, chills,  sweats, unintended wt loss or wt gain, classically pleuritic or exertional cp,  orthopnea pnd or arm/hand swelling  or leg swelling, presyncope, palpitations, abdominal pain, anorexia, nausea, vomiting, diarrhea  or change in bowel habits or change in bladder habits, change in stools or change in urine, dysuria, hematuria,  rash, arthralgias, visual complaints, headache, numbness, weakness or ataxia or problems with walking or coordination,  change in mood or  memory.             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 8 g 0   amphetamine-dextroamphetamine (ADDERALL XR) 20 MG 24 hr capsule Take 1 capsule (20 mg total) by mouth every morning. 30 capsule 0   aspirin EC 81 MG tablet Take 81 mg by mouth daily.     azithromycin (ZITHROMAX) 250 MG tablet 2 tabs  on day 1, 1 tab on day 2-5 6 tablet 0   benzonatate (TESSALON) 100 MG capsule Take 100 mg by mouth 3 (three) times daily.     cyanocobalamin 1000 MCG tablet Take 1,000 mcg by mouth as needed.     ELDERBERRY PO Take by mouth. Takes 2 chewables by mouth daily     flecainide (TAMBOCOR) 50 MG tablet TAKE 1 TABLET BY MOUTH TWICE A DAY 180 tablet 2   guaiFENesin (MUCINEX) 600 MG 12 hr tablet Take 600 mg by mouth daily.     ibuprofen (ADVIL) 200 MG tablet Taking 4 tablets by mouth as needed for arthritis.     metoprolol succinate (TOPROL-XL) 25 MG 24 hr tablet TAKE 1/2 TABLET BY MOUTH EVERY  DAY 45 tablet 1   Multiple Vitamin (MULTIVITAMIN) tablet Take 1 tablet by mouth daily.     PARoxetine (PAXIL-CR) 25 MG 24 hr tablet Take 1 tablet (25 mg total) by mouth daily. 30 tablet 0   predniSONE (DELTASONE) 10 MG tablet 3 tabs x3 days and then 2 tabs x3 days and then 1 tab x3 days.  Take w/ food. 18 tablet 0   amphetamine-dextroamphetamine (ADDERALL) 20 MG tablet Take 1 tablet (20 mg total) by mouth daily. In the afternoon for additional focus 30 tablet 0   fluticasone (FLONASE) 50 MCG/ACT nasal spray Place 2 sprays into both nostrils daily as needed for allergies. Reported on 02/14/2015 16 g 0   No facility-administered medications prior to visit.     Objective:     BP 126/80 (BP Location: Left Arm, Cuff Size: Normal)   Pulse 91   Temp 98.1 F (36.7 C) (Oral)   Ht $R'5\' 3"'mN$  (1.6 m)   Wt 160 lb (72.6 kg)   SpO2 98% Comment: on RA  BMI 28.34 kg/m   SpO2: 98 % (on RA)  Amb wf  with Severe cough on exp/ laughing    HEENT : Oropharynx  clear  Nasal turbintes nl    NECK :  without  appent JVD/ palpable Nodes/TM    LUNGS: no acc muscle use,  Nl contour chest  with lots of upper airway noise but some insp exp rhonchi likely as well    CV:  RRR  no s3 or murmur or increase in P2, and no edema   ABD:  soft and nontender with nl inspiratory excursion in the supine position. No bruits or organomegaly appreciated   MS:  Nl gait/ ext warm without deformities Or obvious joint restrictions  calf tenderness, cyanosis or clubbing     SKIN: warm and dry without lesions    NEURO:  alert, approp, nl sensorium with  no motor or cerebellar deficits apparent.     I personally reviewed images and agree with radiology impression as follows:  CXR:   pa and lateral 06/30/21 Central peribronchial thickening bilaterally, suggesting lower respiratory viral infection, bronchitis-bronchiolitis. No pulmonary consolidation.    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 acute but required  saba short term, recurred late may 2023  This is either cough variant asthma triggered by viral uri or Upper airway cough syndrome (previously labeled PNDS),  is so named because it's frequently impossible to sort out how much is  CR/sinusitis with freq throat clearing (which can be related to primary GERD)   vs  causing  secondary (" extra esophageal")  GERD from wide swings in gastric pressure that occur with throat clearing, often  promoting self use of mint and  menthol lozenges that reduce the lower esophageal sphincter tone and exacerbate the problem further in a cyclical fashion.   These are the same pts (now being labeled as having "irritable larynx syndrome" by some cough centers) who not infrequently have a history of having failed to tolerate ace inhibitors,  dry powder inhalers or biphosphonates or report having atypical/extraesophageal reflux symptoms that don't respond to standard doses of PPI  and are easily confused as having aecopd or asthma flares by even experienced allergists/ pulmonologists (myself included).  Of the three most common causes of  Sub-acute / recurrent or chronic cough, only one (GERD)  can actually contribute to/ trigger  the other two (asthma and post nasal drip syndrome)  and perpetuate the cylce of cough.  While not intuitively obvious, many patients with chronic low grade reflux do not cough until there is a primary insult that disturbs the protective epithelial barrier and exposes sensitive nerve endings.   This is typically  Triggered by viral infection  but can due to PNDS and latter  more likely toapply here.   The point is that once this occurs, it is difficult to eliminate the cycle  using anything but a maximally effective acid suppression regimen at least in the short run, accompanied by an appropriate diet to address non acid GERD and control / eliminate the cough itself for at least 3 days with tussionex >>> also   added 6 day taper off  Prednisone starting  at 40 mg per day in case of component of Th-2 driven upper or lower airways inflammation and    cover for asthma with dulera 100 Take 2 puffs first thing in am and then another 2 puffs about 12 hours later until returns  - The proper method of use, as well as anticipated side effects, of a metered-dose inhaler were discussed and demonstrated to the patient using teach back method   >>> f/u 4 weeks with inhalers in hand        Each maintenance medication was reviewed in detail including emphasizing most importantly the difference between maintenance and prns and under what circumstances the prns are to be triggered using an action plan format where appropriate.  Total time for H and P, chart review, counseling, reviewing hfa  device(s) and generating customized AVS unique to this office visit / same day charting  > 45 min new pt with refractory resp symptoms of unkown origin          Christinia Gully, MD 07/06/2021

## 2021-07-06 NOTE — Patient Instructions (Addendum)
Finish the [employer]  Take 2 puffs first thing in am and then another 2 puffs about 12 hours later.    Work on inhaler technique:  relax and gently blow all the way out then take a nice smooth full deep breath back in, triggering the inhaler at same time you start breathing in.  Hold for up to 5 seconds if you can. Blow out thru nose. Rinse and gargle with water when done.  If mouth or throat bother you at all,  try brushing teeth/gums/tongue with arm and hammer toothpaste/ make a slurry and gargle and spit out.   Only use your albuterol as a rescue medication to be used if you can't catch your breath by resting or doing a relaxed purse lip breathing pattern.  - The less you use it, the better it will work when you need it. - Ok to use up to 2 puffs  every 4 hours if you must but call for immediate appointment if use goes up over your usual need - Don't leave home without it !!  (think of it like the spare tire for your car)   Pantoprazole (protonix) 40 mg   Take  30-60 min before first meal of the day and Pepcid (famotidine)  20 mg after supper until return to office - this is the best way to tell whether stomach acid is contributing to your problem.    GERD (REFLUX)  is an extremely common cause of respiratory symptoms just like yours , many times with no obvious heartburn at all.    It can be treated with medication, but also with lifestyle changes including elevation of the head of your bed (ideally with 6 -8inch blocks under the headboard of your bed),  Smoking cessation, avoidance of late meals, excessive alcohol, and avoid fatty foods, chocolate, peppermint, colas, red wine, and acidic juices such as orange juice.  NO MINT OR MENTHOL PRODUCTS SO NO COUGH DROPS  USE SUGARLESS CANDY INSTEAD (Jolley ranchers or Stover's or Life Savers) or even ice chips will also do - the key is to swallow to prevent all throat clearing. NO OIL BASED VITAMINS - use powdered substitutes.  Avoid  fish oil when coughing.   Tussionex 1tsp up to every 4 -6 hours until no longer coughing x 48 hours  then as needed   Please schedule a follow up office visit in 3-4  weeks, sooner if needed

## 2021-07-07 ENCOUNTER — Other Ambulatory Visit: Payer: Self-pay | Admitting: Internal Medicine

## 2021-07-08 NOTE — Telephone Encounter (Signed)
Called and spoke to patient. She was unsure what alternatives were offered by her insurance.   PA team is this something you can help [place] with?  Thanks!

## 2021-07-08 NOTE — Telephone Encounter (Signed)
Called and left voicemail for patient to call office back in regards to [place]

## 2021-07-09 ENCOUNTER — Ambulatory Visit: Payer: BC Managed Care – PPO | Admitting: Physician Assistant

## 2021-07-10 ENCOUNTER — Telehealth: Payer: Self-pay | Admitting: Family Medicine

## 2021-07-10 ENCOUNTER — Other Ambulatory Visit (HOSPITAL_COMMUNITY): Payer: Self-pay

## 2021-07-10 DIAGNOSIS — F900 Attention-deficit hyperactivity disorder, predominantly inattentive type: Secondary | ICD-10-CM

## 2021-07-10 MED ORDER — AMPHETAMINE-DEXTROAMPHET ER 20 MG PO CP24: 20.0000 mg | ORAL_CAPSULE | ORAL | 0 refills | Status: DC

## 2021-07-10 MED ORDER — PREDNISONE 10 MG PO TABS: ORAL_TABLET | ORAL | 0 refills | Status: DC

## 2021-07-10 MED ORDER — FAMOTIDINE 20 MG PO TABS: ORAL_TABLET | ORAL | 11 refills | Status: DC

## 2021-07-10 MED ORDER — PANTOPRAZOLE SODIUM 40 MG PO TBEC: 40.0000 mg | DELAYED_RELEASE_TABLET | Freq: Every day | ORAL | 2 refills | Status: DC

## 2021-07-10 MED ORDER — SYMBICORT 80-4.5 MCG/ACT IN AERO: 2.0000 | INHALATION_SPRAY | Freq: Two times a day (BID) | RESPIRATORY_TRACT | 12 refills | Status: DC

## 2021-07-10 MED ORDER — HYDROCOD POLI-CHLORPHE POLI ER 10-8 MG/5ML PO SUER: 5.0000 mL | Freq: Two times a day (BID) | ORAL | 0 refills | Status: DC | PRN

## 2021-07-10 NOTE — Telephone Encounter (Signed)
Has not yet acquired all her meds and still coughing  Rec Prednisone 10 mg take  4 each am x 2 days,   2 each am x 2 days,  1 each am x 2 days and stop  Symbicort 80 Take 2 puffs first thing in am and then another 2 puffs about 12 hours later.  Pantoprazole (protonix) 40 mg   Take  30-60 min before first meal of the day and Pepcid (famotidine)  20 mg after supper until return to office   Tussionex titrate to elimination of all cough x 3 days Hard rock candy/ voice rest.   All called in to [facility] 220 @150 

## 2021-07-10 NOTE — Telephone Encounter (Signed)
That should have said another pred x 6 days and be sure using enough tussionex to eliminate the cough completely starting with 1 tsp q 4 h around the clock  - call if not understanding or needing more and I will call her myself this afternoon

## 2021-07-10 NOTE — Telephone Encounter (Signed)
Baker, Monchell R, CPhT  [facility] Triage Pool 19 minutes ago (3:07 PM)    (BRAND) Symbicort and Virgel Bouquet returns a $30 copay, and (BRAND) Advair returns a $5 copay     Routing this to Dr. Sherene Sires for him to review about pt's inhaelrs.

## 2021-07-10 NOTE — Telephone Encounter (Signed)
Called patient but she did not answer. Left message for her to call back.  

## 2021-07-10 NOTE — Telephone Encounter (Signed)
Sent in refill

## 2021-07-10 NOTE — Telephone Encounter (Signed)
Strongly prefer symbicort 80 Take 2 puffs first thing in am and then another 2 puffs about 12 hours later.    Make sure she's taking tussionex regularly thru weekend to get ahead of the cough

## 2021-07-10 NOTE — Telephone Encounter (Signed)
Spoke with who states she is unable to afford Dulera because her insurance will not cover it and she is unsure how to be using albuterol in Dulera's absence. Pt was asked to provide insurance formulary to help with choosing alternative. Pt stated she will finish pred taper and tussinex tomorrow and cough has not improved and she is wondering what to do. Pt was asked to review plan given on last OV's AVS. Pt request we provide her with another copy which was printed out for her and left behind check in dest for pick up. Dr. Sherene Sires please advise.

## 2021-07-10 NOTE — Telephone Encounter (Signed)
NEXT APPOINTMENT DATE:  MEDICATION: adderall xr 20 mg   Is the patient out of medication? Yes   PHARMACY: [facility] on file [phone]

## 2021-07-10 NOTE — Telephone Encounter (Signed)
Called and spoke with pt letting her know that we were sending Rx for Symbicort 80 to the pharmacy for her in place of the [facility] and she verbalized understanding.  Stated to pt the info per MW about the tussionex and when stated that to pt, pt said she was almost out of the tussionex and wanted to know if she could get a refill. Dr. Sherene Sires, please advise.

## 2021-07-10 NOTE — Telephone Encounter (Signed)
Prescription sent

## 2021-07-13 ENCOUNTER — Other Ambulatory Visit: Payer: Self-pay

## 2021-07-13 ENCOUNTER — Telehealth: Payer: Self-pay | Admitting: Internal Medicine

## 2021-07-13 DIAGNOSIS — I9589 Other hypotension: Secondary | ICD-10-CM

## 2021-07-13 MED ORDER — PAROXETINE HCL ER 25 MG PO TB24: 25.0000 mg | ORAL_TABLET | Freq: Every day | ORAL | 0 refills | Status: DC

## 2021-07-13 NOTE — Telephone Encounter (Signed)
Received message from the answering service, "Dr. Melvyn Novas prescribed hydrocodone/chlorphener, but the pharmacy is saying they can't prescribe it , but it can't be written for 12 hours.  Please advise.

## 2021-07-15 NOTE — Telephone Encounter (Signed)
Called pt's pharmacy to see if pt had picked up the cough med and they said that pt has not picked Rx up but it is ready for pickup.  Attempted to call pt to let her know this info but unable to reach. Left message for her to return call.

## 2021-07-17 ENCOUNTER — Ambulatory Visit: Payer: BC Managed Care – PPO | Admitting: Family Medicine

## 2021-07-17 ENCOUNTER — Encounter: Payer: Self-pay | Admitting: Family Medicine

## 2021-07-17 VITALS — BP 122/80 | HR 72 | Temp 98.2°F | Resp 16 | Ht 63.0 in | Wt 159.1 lb

## 2021-07-17 DIAGNOSIS — H9313 Tinnitus, bilateral: Secondary | ICD-10-CM | POA: Diagnosis not present

## 2021-07-17 DIAGNOSIS — R251 Tremor, unspecified: Secondary | ICD-10-CM | POA: Diagnosis not present

## 2021-07-17 DIAGNOSIS — R053 Chronic cough: Secondary | ICD-10-CM | POA: Diagnosis not present

## 2021-07-17 LAB — CBC WITH DIFFERENTIAL/PLATELET
Basophils Absolute: 0.1 10*3/uL (ref 0.0–0.1)
Basophils Relative: 1.2 % (ref 0.0–3.0)
Eosinophils Absolute: 0 10*3/uL (ref 0.0–0.7)
Eosinophils Relative: 0.2 % (ref 0.0–5.0)
HCT: 41.8 % (ref 36.0–46.0)
Hemoglobin: 13.8 g/dL (ref 12.0–15.0)
Lymphocytes Relative: 12.6 % (ref 12.0–46.0)
Lymphs Abs: 1.2 10*3/uL (ref 0.7–4.0)
MCHC: 33.1 g/dL (ref 30.0–36.0)
MCV: 93.4 fl (ref 78.0–100.0)
Monocytes Absolute: 0.6 10*3/uL (ref 0.1–1.0)
Monocytes Relative: 6.8 % (ref 3.0–12.0)
Neutro Abs: 7.3 10*3/uL (ref 1.4–7.7)
Neutrophils Relative %: 79.2 % — ABNORMAL HIGH (ref 43.0–77.0)
Platelets: 325 10*3/uL (ref 150.0–400.0)
RBC: 4.47 Mil/uL (ref 3.87–5.11)
RDW: 14.1 % (ref 11.5–15.5)
WBC: 9.2 10*3/uL (ref 4.0–10.5)

## 2021-07-17 LAB — BASIC METABOLIC PANEL
BUN: 18 mg/dL (ref 6–23)
CO2: 31 mEq/L (ref 19–32)
Calcium: 9.7 mg/dL (ref 8.4–10.5)
Chloride: 98 mEq/L (ref 96–112)
Creatinine, Ser: 1.08 mg/dL (ref 0.40–1.20)
GFR: 58.43 mL/min — ABNORMAL LOW (ref >60.00)
Glucose, Bld: 86 mg/dL (ref 70–99)
Potassium: 4.9 mEq/L (ref 3.5–5.1)
Sodium: 138 mEq/L (ref 135–145)

## 2021-07-17 LAB — HEPATIC FUNCTION PANEL
ALT: 81 U/L — ABNORMAL HIGH (ref 0–35)
AST: 24 U/L (ref 0–37)
Albumin: 4.2 g/dL (ref 3.5–5.2)
Alkaline Phosphatase: 76 U/L (ref 39–117)
Bilirubin, Direct: 0.2 mg/dL (ref 0.0–0.3)
Total Bilirubin: 0.8 mg/dL (ref 0.2–1.2)
Total Protein: 7.2 g/dL (ref 6.0–8.3)

## 2021-07-17 LAB — VITAMIN D 25 HYDROXY (VIT D DEFICIENCY, FRACTURES): VITD: 37.43 ng/mL (ref 30.00–100.00)

## 2021-07-17 LAB — B12 AND FOLATE PANEL
Folate: 7.6 ng/mL (ref >5.9)
Vitamin B-12: 590 pg/mL (ref 211–911)

## 2021-07-17 LAB — TSH: TSH: 1.33 u[IU]/mL (ref 0.35–5.50)

## 2021-07-17 NOTE — Patient Instructions (Signed)
Follow up as needed or as scheduled We'll notify you of your lab results and make any changes if needed Drink LOTS of water RESTART a daily allergy medication and Flonase Call and schedule w/ Dr Suszanne Conners to reassess the tinnitus  Call with any questions or concerns Sherri Rad in there!!!

## 2021-07-17 NOTE — Progress Notes (Incomplete)
   Subjective:    Patient ID: Jenna Cortez, female    DOB: [birth year], 56 y.o.   MRN: [id number]  HPI Cough- pt had appt on 6/1 for similar sxs (which was 4th appt in 1 week).  Then she saw Dr Sherene Sires on 6/5.  'i feel like I'm getting ready to go down a rabbit hole'.  Pt has been on codeine and feels like she's been shaky and foggy.  Currently on Symbicort.  Has 2 more days of Prednisone.  Has pulmonary f/u later this month  Tinnitus- worsening over the last 3 weeks.  Not currently on allergy medication.  Has previously seen ENT   Review of Systems For ROS see HPI     Objective:   Physical Exam        Assessment & Plan:

## 2021-07-20 ENCOUNTER — Other Ambulatory Visit: Payer: Self-pay

## 2021-07-20 ENCOUNTER — Telehealth: Payer: Self-pay

## 2021-07-20 DIAGNOSIS — R748 Abnormal levels of other serum enzymes: Secondary | ICD-10-CM

## 2021-07-20 NOTE — Telephone Encounter (Signed)
-----   Message from Sheliah Hatch, MD sent at 07/20/2021  7:38 AM EDT ----- Labs look good w/ exception of elevated ALT (liver enzyme).  Please hold tylenol and any alcohol x2 weeks and we'll repeat your LFTs at a lab only visit to ensure this is back to normal.

## 2021-07-20 NOTE — Telephone Encounter (Signed)
Patient has called back to schedule her 2 week lab appointment to repeat labs.

## 2021-07-24 ENCOUNTER — Encounter: Payer: Self-pay | Admitting: Family Medicine

## 2021-07-24 ENCOUNTER — Ambulatory Visit (INDEPENDENT_AMBULATORY_CARE_PROVIDER_SITE_OTHER): Payer: BC Managed Care – PPO | Admitting: Family Medicine

## 2021-07-24 VITALS — BP 126/80 | HR 42 | Temp 97.6°F | Resp 16 | Ht 64.0 in | Wt 160.0 lb

## 2021-07-24 DIAGNOSIS — Z0000 Encounter for general adult medical examination without abnormal findings: Secondary | ICD-10-CM | POA: Diagnosis not present

## 2021-07-24 DIAGNOSIS — E663 Overweight: Secondary | ICD-10-CM | POA: Diagnosis not present

## 2021-07-24 LAB — HEPATIC FUNCTION PANEL
ALT: 32 U/L (ref 0–35)
AST: 24 U/L (ref 0–37)
Albumin: 4.1 g/dL (ref 3.5–5.2)
Alkaline Phosphatase: 76 U/L (ref 39–117)
Bilirubin, Direct: 0.1 mg/dL (ref 0.0–0.3)
Total Bilirubin: 0.5 mg/dL (ref 0.2–1.2)
Total Protein: 6.9 g/dL (ref 6.0–8.3)

## 2021-07-24 LAB — LIPID PANEL
Cholesterol: 226 mg/dL — ABNORMAL HIGH (ref 0–200)
HDL: 83.6 mg/dL (ref >39.00)
LDL Cholesterol: 128 mg/dL — ABNORMAL HIGH (ref 0–99)
NonHDL: 142.78
Total CHOL/HDL Ratio: 3
Triglycerides: 72 mg/dL (ref 0.0–149.0)
VLDL: 14.4 mg/dL (ref 0.0–40.0)

## 2021-07-27 ENCOUNTER — Encounter: Payer: Self-pay | Admitting: Family Medicine

## 2021-07-27 NOTE — Progress Notes (Signed)
Pt seen results VIA my chart  

## 2021-07-30 ENCOUNTER — Encounter: Payer: Self-pay | Admitting: Physician Assistant

## 2021-07-30 ENCOUNTER — Ambulatory Visit: Payer: BC Managed Care – PPO | Admitting: Physician Assistant

## 2021-07-30 VITALS — BP 116/80 | HR 95 | Ht 64.0 in | Wt 160.8 lb

## 2021-07-30 DIAGNOSIS — I480 Paroxysmal atrial fibrillation: Secondary | ICD-10-CM

## 2021-07-30 DIAGNOSIS — Z5181 Encounter for therapeutic drug level monitoring: Secondary | ICD-10-CM | POA: Diagnosis not present

## 2021-07-30 DIAGNOSIS — R0602 Shortness of breath: Secondary | ICD-10-CM | POA: Diagnosis not present

## 2021-07-30 DIAGNOSIS — Z79899 Other long term (current) drug therapy: Secondary | ICD-10-CM | POA: Diagnosis not present

## 2021-07-30 NOTE — Patient Instructions (Signed)
Medication Instructions:  Your physician recommends that you continue on your current medications as directed. Please refer to the Current Medication list given to you today.  *If you need a refill on your cardiac medications before your next appointment, please call your pharmacy*   Lab Work: None If you have labs (blood work) drawn today and your tests are completely normal, you will receive your results only by: MyChart Message (if you have MyChart) OR A paper copy in the mail If you have any lab test that is abnormal or we need to change your treatment, we will call you to review the results.   Testing/Procedures: Your physician has requested that you have an echocardiogram ASAP.  Echocardiography is a painless test that uses sound waves to create images of your heart. It provides your doctor with information about the size and shape of your heart and how well your heart's chambers and valves are working. This procedure takes approximately one hour. There are no restrictions for this procedure.   Follow-Up: At [facility], you and your health needs are our priority.  As part of our continuing mission to provide you with exceptional heart care, we have created designated Provider Care Teams.  These Care Teams include your primary Cardiologist (physician) and Advanced Practice Providers (APPs -  Physician Assistants and Nurse Practitioners) who all work together to provide you with the care you need, when you need it.   Your next appointment:   6 month(s)  The format for your next appointment:   In Person  Provider:   You will follow up in the Atrial Fibrillation Clinic located at [facility]. Your provider will be: Clint R. Fenton, PA-C

## 2021-07-30 NOTE — Progress Notes (Signed)
Cardiology Office Note Date:  07/30/2021  Patient ID:  Jenna Cortez, Jenna Cortez [birth year], MRN [id number] PCP:  Sheliah Hatch, MD  Electrophysiologist: Dr. Johney Frame    Chief Complaint:  over due  History of Present Illness: Jenna Cortez is a 56 y.o. female with history of AFib  She come sin today to be seen for Dr. Johney Frame, last seen by him via tele health visit June 2020. She was doing very well, exercising regularly with no symptoms of AFib. She was maintained on flecainide/BB. Felt an acceptable risk for her upcoming surgery at the time.  She has followed with the AFib clinic since then, the last was Dec 2022, doing well wir=th rare brief palpitations only.  Father was quite ill on Hospice at the time. EKG with stable intervals No changes were made. Planned for an annual visit.  TODAY She is accompanied by her husband About 6 weeks ago she had an undiagnosed illness lasted 2 weeks, reports tested negative for all the respiratory panel illnesses including COVID She recalls right att he beginning of COVID years ago she felt just as sick and suspects she had covid then an this time as well Started as a simple cough, developed  into severe illness, fever unable to get out of bed.  Felt awful. Since then she has never felt well again No energy, sleeps as soon as she gets home from work/ Absolutely no energy.  Prior to that felt quite well, she has seen her PMD and pulmonary, Dr Sherene Sires.  She/they would like to make sure her heart is OK  No near syncope or syncope She does not think she has had any AFib No CP C/w a very irritable cough  AFib/AAD hx Diagnosed 2013 Flecainide goes back at least to 2013  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    NEGATIVE MUTATION 01/05/06   Depression    Dysplasia of cervix, unspecified    Herpes    Hyperprolactinemia (HCC)    Insomnia    Migraine    "2-3/year"  (12/18/2015)   Paroxysmal atrial fibrillation (HCC)    TMJ (dislocation of temporomandibular joint)     Past Surgical History:  Procedure Laterality Date   AUGMENTATION MAMMAPLASTY Bilateral 1998   BREAST BIOPSY Bilateral 2001-2003   RIGHT BREAST BIOPSY 2001-PAPILOMA/LT BR BXY-BENIGN 2003   CERVIX LESION DESTRUCTION  ~ 1993   CLAVICLE HARDWARE REMOVAL Right ~ 2008   EYE SURGERY     FRACTURE SURGERY     HYSTEROSCOPY N/A 07/27/2013   Procedure: HYSTEROSCOPY WITH HYDROTHERMAL ABLATION;  Surgeon: Ok Edwards, MD;  Location: [facility];  Service: Gynecology;  Laterality: N/A;   LAPAROSCOPIC CHOLECYSTECTOMY  2011   ORIF CLAVICLE FRACTURE Right ~ 2008   CLAVICLE FRACTURE-TRAUMATIC   REFRACTIVE SURGERY Bilateral 1990s   SHOULDER ARTHROSCOPY Right 2016   "labrium cartilage repair"   SHOULDER ARTHROSCOPY WITH ROTATOR CUFF REPAIR Right 2000   TRIGGER FINGER RELEASE Right    thumb   tube in ear Left     Current Outpatient Medications  Medication Sig Dispense Refill   albuterol (VENTOLIN HFA) 108 (90 Base) MCG/ACT inhaler Inhale 2 puffs into the lungs every 6 (six) hours as needed for wheezing or shortness of breath. 8 g 0   amphetamine-dextroamphetamine (ADDERALL XR) 20 MG 24 hr capsule Take 1 capsule (20 mg total) by mouth every morning. 30 capsule 0  amphetamine-dextroamphetamine (ADDERALL) 20 MG tablet Take 1 tablet (20 mg total) by mouth daily. In the afternoon for additional focus 30 tablet 0   cyanocobalamin 1000 MCG tablet Take 1,000 mcg by mouth as needed.     ELDERBERRY PO Take by mouth. Takes 2 chewables by mouth daily     famotidine (PEPCID) 20 MG tablet One after supper 30 tablet 11   flecainide (TAMBOCOR) 50 MG tablet TAKE 1 TABLET BY MOUTH TWICE A DAY 180 tablet 2   ibuprofen (ADVIL) 200 MG tablet Taking 4 tablets by mouth as needed for arthritis.     metoprolol succinate (TOPROL-XL) 25 MG 24 hr tablet TAKE 1/2 TABLET BY MOUTH EVERY DAY 45 tablet 1   Multiple Vitamin  (MULTIVITAMIN) tablet Take 1 tablet by mouth daily.     pantoprazole (PROTONIX) 40 MG tablet Take 1 tablet (40 mg total) by mouth daily. Take 30-60 min before first meal of the day 30 tablet 2   PARoxetine (PAXIL-CR) 25 MG 24 hr tablet Take 1 tablet (25 mg total) by mouth daily. 30 tablet 0   SYMBICORT 80-4.5 MCG/ACT inhaler Inhale 2 puffs into the lungs in the morning and at bedtime. 10.2 g 12   No current facility-administered medications for this visit.    Allergies:   Codeine, Sulfa antibiotics, Other, and Sulfonamide derivatives   Social History:  The patient  reports that she has never smoked. She has never used smokeless tobacco. She reports current alcohol use. She reports that she does not use drugs.   Family History:  The patient's family history includes Atrial fibrillation in her brother; Breast cancer (age of onset: 39) in her mother; Breast cancer (age of onset: 93) in her maternal aunt; Hypertension in her father.  ROS:  Please see the history of present illness.    All other systems are reviewed and otherwise negative.   PHYSICAL EXAM:  VS:  There were no vitals taken for this visit. BMI: There is no height or weight on file to calculate BMI. Well nourished, well developed, in no acute distress HEENT: normocephalic, atraumatic Neck: no JVD, carotid bruits or masses Cardiac:  RRR; no significant murmurs, no rubs, or gallops Lungs:  CTA b/l, no wheezing, rhonchi or rales Abd: soft, nontender MS: no deformity or atrophy Ext: no edema Skin: warm and dry, no rash Neuro:  No gross deficits appreciated Psych: euthymic mood, full affect    EKG:  Done today and reviewed by myself shows  SR 97bpm, PR 140ms, QRS 29ms, QTc 428ms  04/01/2011: TTE Impressions:  - Normal LV size and systolic function, EF 14-78%. Normal    diastolic function. No significant valvular dysfunction.    Normal RV size and systolic function. Upper normal left    atrial size.    Recent  Labs: 07/17/2021: BUN 18; Creatinine, Ser 1.08; Hemoglobin 13.8; Platelets 325.0; Potassium 4.9; Sodium 138; TSH 1.33 07/24/2021: ALT 32  07/24/2021: Cholesterol 226; HDL 83.60; LDL Cholesterol 128; Total CHOL/HDL Ratio 3; Triglycerides 72.0; VLDL 14.4   Estimated Creatinine Clearance: 58.2 mL/min (by C-G formula based on SCr of 1.08 mg/dL).   Wt Readings from Last 3 Encounters:  07/24/21 160 lb (72.6 kg)  07/17/21 159 lb 2 oz (72.2 kg)  07/06/21 160 lb (72.6 kg)     Other studies reviewed: Additional studies/records reviewed today include: summarized above  ASSESSMENT AND PLAN:  Paroxysmal AFib CHA2DS2Vasc is only one for gender Not on a/c Maintained on flecainide and metoprolol stable intervals  2.  Post illness fatigue, cough We can get an echo to r/o post viral CM  Disposition: F/u with the Afib clinic otherwise in 55mo, sooner if needed  Current medicines are reviewed at length with the patient today.  The patient did not have any concerns regarding medicines.  Venetia Night, PA-C 07/30/2021 8:38 AM     [address] [place] [address] [phone] (office)  [phone] (fax)

## 2021-08-03 ENCOUNTER — Ambulatory Visit (HOSPITAL_COMMUNITY): Payer: BC Managed Care – PPO | Attending: Cardiology

## 2021-08-03 ENCOUNTER — Other Ambulatory Visit: Payer: BC Managed Care – PPO

## 2021-08-03 DIAGNOSIS — I480 Paroxysmal atrial fibrillation: Secondary | ICD-10-CM | POA: Insufficient documentation

## 2021-08-03 DIAGNOSIS — R0602 Shortness of breath: Secondary | ICD-10-CM | POA: Diagnosis present

## 2021-08-03 LAB — ECHOCARDIOGRAM COMPLETE
Area-P 1/2: 4.46 cm2
S' Lateral: 2.2 cm

## 2021-08-13 ENCOUNTER — Ambulatory Visit: Payer: BC Managed Care – PPO | Admitting: Internal Medicine

## 2021-08-13 ENCOUNTER — Encounter: Payer: Self-pay | Admitting: Internal Medicine

## 2021-08-13 ENCOUNTER — Other Ambulatory Visit: Payer: Self-pay

## 2021-08-13 DIAGNOSIS — I9589 Other hypotension: Secondary | ICD-10-CM

## 2021-08-13 DIAGNOSIS — J45991 Cough variant asthma: Secondary | ICD-10-CM | POA: Diagnosis not present

## 2021-08-13 MED ORDER — PAROXETINE HCL ER 25 MG PO TB24: 25.0000 mg | ORAL_TABLET | Freq: Every day | ORAL | 0 refills | Status: DC

## 2021-08-13 NOTE — Progress Notes (Signed)
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 Albuterol neg covid x one and neg flu   History of Present Illness  07/06/2021  Pulmonary/ 1st office eval/Miriana Gaertner  Chief Complaint  Patient presents with   Pulmonary Consult    Self referral for cough. She c/o cough x 2 wks- occ prod with clear sputum. She just completed Zpack today and currently on pred taper.   Dyspnea:  feels shallow Cough: not productive  Sleep: coughing at hs  SABA use: not on day of day  Overt hb since onset of cough was not a problem prior Gen cp / tightness during severe coughing fits  Rec Finish the prednsione  [employer]  Take 2 puffs first thing in am and then another 2 puffs about 12 hours later.  Work on inhaler technique:   Only use your albuterol as a rescue medication  Pantoprazole (protonix) 40 mg   Take  30-60 min before first meal of the day and Pepcid (famotidine)  20 mg after supper until return to office  GERD diet Tussionex 1tsp up to every 4 -6 hours until no longer coughing x 48 hours  then as needed  Please schedule a follow up office visit in 3-4  weeks, sooner if needed     08/13/2021  f/u ov/Sadeel Fiddler re: cough since 06/22/21    maint on symbicort 80  2bid   Chief Complaint  Patient presents with   Follow-up    Cough is much improved. She has occ cough now, prod at times with thick, yellow sputum. She has occ, mild SOB and uses her albuterol inhaler about 2 x per wk.    Dyspnea:  Not limited by breathing from desired activities   Cough: p supper  and at hs still coughs, occ thick and yellow  Sleeping: flat bed 2 pillows ok  SABA use: up to twice weekly   02: no  Covid status:   x 2 vax   No obvious day to day or daytime variability or assoc  mucus plugs or hemoptysis or cp or chest tightness, subjective wheeze or overt sinus or hb symptoms.   Sleeping  without nocturnal  or early am exacerbation  of respiratory  c/o's or need for noct saba. Also denies any obvious fluctuation of symptoms with weather or environmental changes or other aggravating or alleviating factors except as outlined above   No unusual exposure hx or h/o childhood pna/ asthma or knowledge of premature birth.  Current Allergies, Complete Past Medical History, Past Surgical History, Family History, and Social History were reviewed in [employer] record.  ROS  The following are not active complaints unless bolded Hoarseness, sore throat, dysphagia, dental problems, itching, sneezing,  nasal congestion or discharge of excess mucus or purulent secretions, ear ache,   fever, chills, sweats, unintended wt loss or wt gain, classically pleuritic or exertional cp,  orthopnea pnd or arm/hand swelling  or leg swelling, presyncope, palpitations, abdominal  pain, anorexia, nausea, vomiting, diarrhea  or change in bowel habits or change in bladder habits, change in stools or change in urine, dysuria, hematuria,  rash, arthralgias, visual complaints, headache, numbness, weakness or ataxia or problems with walking or coordination,  change in mood or  memory.        Current Meds  Medication Sig   albuterol (VENTOLIN HFA) 108 (90 Base) MCG/ACT inhaler Inhale 2 puffs into the lungs every 6 (six) hours as needed for wheezing or shortness of breath.   amphetamine-dextroamphetamine (ADDERALL XR) 20 MG 24 hr capsule Take 1 capsule (20 mg total) by mouth every morning.   amphetamine-dextroamphetamine (ADDERALL) 20 MG tablet Take 1 tablet (20 mg total) by mouth daily.  In the afternoon for additional focus   ELDERBERRY PO Take by mouth. Takes 2 chewables by mouth daily   famotidine (PEPCID) 20 MG tablet One after supper   flecainide (TAMBOCOR) 50 MG tablet TAKE 1 TABLET BY MOUTH TWICE A DAY   ibuprofen (ADVIL) 200 MG tablet Taking 4 tablets by mouth as needed for arthritis.   metoprolol succinate (TOPROL-XL) 25 MG 24 hr tablet TAKE 1/2 TABLET BY MOUTH EVERY DAY   Multiple Vitamin (MULTIVITAMIN) tablet Take 1 tablet by mouth daily.   pantoprazole (PROTONIX) 40 MG tablet Take 1 tablet (40 mg total) by mouth daily. Take 30-60 min before first meal of the day   PARoxetine (PAXIL-CR) 25 MG 24 hr tablet Take 1 tablet (25 mg total) by mouth daily.   SYMBICORT 80-4.5 MCG/ACT inhaler Inhale 2 puffs into the lungs in the morning and at bedtime.                    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    NEGATIVE MUTATION 01/05/06   Depression    Dysplasia of cervix, unspecified    Herpes    Hyperprolactinemia (HCC)    Insomnia    Migraine    "2-3/year" (12/18/2015)   Paroxysmal atrial fibrillation (HCC)    TMJ (dislocation of temporomandibular joint)       Objective:     Wt Readings from Last 3 Encounters:  08/13/21 162 lb (73.5 kg)  07/30/21 160 lb 12.8 oz (72.9 kg)  07/24/21 160 lb (72.6 kg)      Vital signs reviewed  08/13/2021  - Note at rest 02 sats  99% on RA   General appearance:    pleasant amb wf nad    HEENT : Oropharynx  clear      Nasal turbinates nl    NECK :  without  apparent JVD/ palpable Nodes/TM    LUNGS: no acc muscle use,  Nl contour chest which is clear to A and P bilaterally without cough on insp or exp maneuvers   CV:  RRR  no s3 or murmur or increase in P2, and no edema   ABD:  soft and nontender with nl inspiratory excursion in the supine position. No bruits or organomegaly appreciated   MS:  Nl gait/ ext warm without deformities  Or obvious joint restrictions  calf tenderness, cyanosis or clubbing    SKIN: warm and dry without lesions    NEURO:  alert, approp, nl sensorium with  no motor or cerebellar deficits apparent.          Assessment

## 2021-08-13 NOTE — Patient Instructions (Addendum)
Augmentin 875 mg take one pill twice daily  X 10 days - take at breakfast and supper with large glass of water.  It would help reduce the usual side effects (diarrhea and yeast infections) if you ate cultured yogurt at lunch.   No change in medications   Please remember to go to the lab department   for your tests - we will call you with the results when they are available.   If not better next step is Sinus CT      Please schedule a follow up office visit in 6 weeks, call sooner if needed

## 2021-08-14 ENCOUNTER — Encounter: Payer: Self-pay | Admitting: Internal Medicine

## 2021-08-14 LAB — CBC WITH DIFFERENTIAL/PLATELET
Basophils Absolute: 0 10*3/uL (ref 0.0–0.1)
Basophils Relative: 0.5 % (ref 0.0–3.0)
Eosinophils Absolute: 0.1 10*3/uL (ref 0.0–0.7)
Eosinophils Relative: 1.4 % (ref 0.0–5.0)
HCT: 37.1 % (ref 36.0–46.0)
Hemoglobin: 12.6 g/dL (ref 12.0–15.0)
Lymphocytes Relative: 28.8 % (ref 12.0–46.0)
Lymphs Abs: 1.4 10*3/uL (ref 0.7–4.0)
MCHC: 33.9 g/dL (ref 30.0–36.0)
MCV: 91.6 fl (ref 78.0–100.0)
Monocytes Absolute: 0.5 10*3/uL (ref 0.1–1.0)
Monocytes Relative: 10.1 % (ref 3.0–12.0)
Neutro Abs: 2.8 10*3/uL (ref 1.4–7.7)
Neutrophils Relative %: 59.2 % (ref 43.0–77.0)
Platelets: 261 10*3/uL (ref 150.0–400.0)
RBC: 4.05 Mil/uL (ref 3.87–5.11)
RDW: 13.5 % (ref 11.5–15.5)
WBC: 4.8 10*3/uL (ref 4.0–10.5)

## 2021-08-14 MED ORDER — AMOXICILLIN-POT CLAVULANATE 875-125 MG PO TABS: 1.0000 | ORAL_TABLET | Freq: Two times a day (BID) | ORAL | 0 refills | Status: DC

## 2021-08-14 NOTE — Addendum Note (Signed)
Addended by: Sandrea Hughs B on: 08/14/2021 01:22 PM   Modules accepted: Orders

## 2021-08-14 NOTE — Assessment & Plan Note (Signed)
Onset Jan 2020 acute but required saba short term, recurred late may 2023 - 08/13/2021  After extensive coaching inhaler device,  effectiveness =    90%  -  Allergy screen 08/13/2021 >  Eos 0. 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 Underlying sinus dz as indicated by persistent intermittently ? Purulent mucus   rec augmentin x 10 days then sinus ct if not improved (vs ent eval if insurance require consult first) Continue present rx x 6 week then regroup           Each maintenance medication was reviewed in detail including emphasizing most importantly the difference between maintenance and prns and under what circumstances the prns are to be triggered using an action plan format where appropriate.  Total time for H and P, chart review, counseling, reviewing hfa device(s) and generating customized AVS unique to this office visit / same day charting = 22 min

## 2021-08-15 LAB — IGE: IgE (Immunoglobulin E), Serum: 7 kU/L (ref <=114)

## 2021-08-18 ENCOUNTER — Other Ambulatory Visit: Payer: Self-pay | Admitting: Family Medicine

## 2021-08-18 DIAGNOSIS — F900 Attention-deficit hyperactivity disorder, predominantly inattentive type: Secondary | ICD-10-CM

## 2021-08-18 NOTE — Telephone Encounter (Signed)
Patient is requesting a refill of the following medications: Requested Prescriptions   Pending Prescriptions Disp Refills   amphetamine-dextroamphetamine (ADDERALL XR) 20 MG 24 hr capsule 30 capsule 0    Sig: Take 1 capsule (20 mg total) by mouth every morning.    Date of patient request: 08/18/21 Last office visit: 07/24/21 Date of last refill: 07/10/21 Last refill amount: 30

## 2021-08-18 NOTE — Telephone Encounter (Signed)
Encourage patient to contact the pharmacy for refills or they can request refills through [facility]  (Please schedule appointment if patient has not been seen in over a year)    WHAT PHARMACY WOULD THEY LIKE THIS SENT TO: [facility] [address] [phone]  MEDICATION NAME & DOSE: adderal xr 20 mg AND adderall 20 mg  NOTES/COMMENTS FROM PATIENT:      Front office please notify patient: It takes 48-72 hours to process rx refill requests Ask patient to call pharmacy to ensure rx is ready before heading there.

## 2021-08-19 MED ORDER — AMPHETAMINE-DEXTROAMPHET ER 20 MG PO CP24: 20.0000 mg | ORAL_CAPSULE | ORAL | 0 refills | Status: DC

## 2021-08-24 ENCOUNTER — Telehealth: Payer: Self-pay | Admitting: Family Medicine

## 2021-08-24 ENCOUNTER — Other Ambulatory Visit: Payer: Self-pay | Admitting: Family Medicine

## 2021-08-24 DIAGNOSIS — F900 Attention-deficit hyperactivity disorder, predominantly inattentive type: Secondary | ICD-10-CM

## 2021-08-24 MED ORDER — AMPHETAMINE-DEXTROAMPHET ER 20 MG PO CP24: 20.0000 mg | ORAL_CAPSULE | ORAL | 0 refills | Status: DC

## 2021-08-24 MED ORDER — AMPHETAMINE-DEXTROAMPHETAMINE 20 MG PO TABS: 20.0000 mg | ORAL_TABLET | Freq: Every day | ORAL | 0 refills | Status: DC

## 2021-08-24 MED ORDER — ALBUTEROL SULFATE HFA 108 (90 BASE) MCG/ACT IN AERS: 2.0000 | INHALATION_SPRAY | Freq: Four times a day (QID) | RESPIRATORY_TRACT | 0 refills | Status: DC | PRN

## 2021-08-24 NOTE — Telephone Encounter (Signed)
Pt's prescriptions re-sent to correct pharmacy

## 2021-08-24 NOTE — Telephone Encounter (Signed)
Caller name: Nakima Fluegge  On DPR? :yes/no: Yes  Call back [phone]  Provider they see: Beverely Low   Reason for call: Pt called stating that both of her medications Adderrall 20mg  and Adderrall XR 20 mg were sent to the wrong pharmacy. [facility] [place] [place] [address] [place] [address].

## 2021-09-24 ENCOUNTER — Other Ambulatory Visit: Payer: Self-pay

## 2021-09-24 ENCOUNTER — Encounter: Payer: Self-pay | Admitting: Internal Medicine

## 2021-09-24 ENCOUNTER — Ambulatory Visit (INDEPENDENT_AMBULATORY_CARE_PROVIDER_SITE_OTHER): Payer: BC Managed Care – PPO | Admitting: Internal Medicine

## 2021-09-24 VITALS — BP 108/76 | HR 90 | Temp 98.4°F | Ht 64.0 in | Wt 159.0 lb

## 2021-09-24 DIAGNOSIS — J45991 Cough variant asthma: Secondary | ICD-10-CM | POA: Diagnosis not present

## 2021-09-24 LAB — POCT EXHALED NITRIC OXIDE: FeNO level (ppb): 17

## 2021-09-24 MED ORDER — ALBUTEROL SULFATE HFA 108 (90 BASE) MCG/ACT IN AERS: 2.0000 | INHALATION_SPRAY | Freq: Four times a day (QID) | RESPIRATORY_TRACT | 1 refills | Status: DC | PRN

## 2021-09-24 NOTE — Patient Instructions (Signed)
Try symbicort 80  0-2 puffs up to every 12 hours   Please schedule a follow up visit in 3 months but call sooner if needed

## 2021-09-24 NOTE — Progress Notes (Signed)
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 Albuterol neg covid x one and neg flu   History of Present Illness  07/06/2021  Pulmonary/ 1st office eval/Jenna Cortez  Chief Complaint  Patient presents with   Pulmonary Consult    Self referral for cough. She c/o cough x 2 wks- occ prod with clear sputum. She just completed Zpack today and currently on pred taper.   Dyspnea:  feels shallow Cough: not productive  Sleep: coughing at hs  SABA use: not on day of day  Overt hb since onset of cough was not a problem prior Gen cp / tightness during severe coughing fits  Rec Finish the prednsione  [employer]  Take 2 puffs first thing in am and then another 2 puffs about 12 hours later.  Work on inhaler technique:   Only use your albuterol as a rescue medication  Pantoprazole (protonix) 40 mg   Take  30-60 min before first meal of the day and Pepcid (famotidine)  20 mg after supper until return to office  GERD diet Tussionex 1tsp up to every 4 -6 hours until no longer coughing x 48 hours  then as needed  Please schedule a follow up office visit in 3-4  weeks, sooner if needed     08/13/2021  f/u ov/Jenna Cortez re: cough since 06/22/21    maint on symbicort 80  2bid   Chief Complaint  Patient presents with   Follow-up    Cough is much improved. She has occ cough now, prod at times with thick, yellow sputum. She has occ, mild SOB and uses her albuterol inhaler about 2 x per wk.    Dyspnea:  Not limited by breathing from desired activities   Cough: p supper  and at hs still coughs, occ thick and yellow  Sleeping: flat bed 2 pillows ok  SABA use: up to twice weekly   02: no  Covid status:   x 2 vax Rec Augmentin 875 mg take one pill twice daily  X 10 days   If not better next step is Sinus CT> not done as of 09/24/2021         09/24/2021  f/u ov/Jenna Cortez re: cough variant asthma  maint on symbicort 80 2bid   Chief Complaint  Patient presents with   Follow-up    Follow up. Patient has no complaints.    Dyspnea:  no aerobics but not limited in any way by sob Cough: minimal dry cough/ urge to clear throat, no long cough on exp maneuvers   Sleeping: flat bed 2 pillows no cough  SABA use: not at all  02: none      No obvious day to day or daytime variability or assoc excess/ purulent sputum or mucus plugs or hemoptysis or cp or chest tightness, subjective wheeze or overt sinus or hb symptoms.   Sleeping  without nocturnal  or early am exacerbation  of respiratory  c/o's or need for noct  saba. Also denies any obvious fluctuation of symptoms with weather or environmental changes or other aggravating or alleviating factors except as outlined above   No unusual exposure hx or h/o childhood pna/ asthma or knowledge of premature birth.  Current Allergies, Complete Past Medical History, Past Surgical History, Family History, and Social History were reviewed in [employer] record.  ROS  The following are not active complaints unless bolded Hoarseness, sore throat, dysphagia, dental problems, itching, sneezing,  nasal congestion or discharge of excess mucus or purulent secretions, ear ache,   fever, chills, sweats, unintended wt loss or wt gain, classically pleuritic or exertional cp,  orthopnea pnd or arm/hand swelling  or leg swelling, presyncope, palpitations, abdominal pain, anorexia, nausea, vomiting, diarrhea  or change in bowel habits or change in bladder habits, change in stools or change in urine, dysuria, hematuria,  rash,  arthralgias, visual complaints, headache, numbness, weakness or ataxia or problems with walking or coordination,  change in mood or  memory.        Current Meds  Medication Sig   albuterol (VENTOLIN HFA) 108 (90 Base) MCG/ACT inhaler Inhale 2 puffs into the lungs every 6 (six) hours as needed for wheezing or shortness of breath.   amphetamine-dextroamphetamine (ADDERALL XR) 20 MG 24 hr capsule Take 1 capsule (20 mg total) by mouth every morning.   amphetamine-dextroamphetamine (ADDERALL) 20 MG tablet Take 1 tablet (20 mg total) by mouth daily. In the afternoon for additional focus   ELDERBERRY PO Take by mouth. Takes 2 chewables by mouth daily   famotidine (PEPCID) 20 MG tablet One after supper   flecainide (TAMBOCOR) 50 MG tablet TAKE 1 TABLET BY MOUTH TWICE A DAY   ibuprofen (ADVIL) 200 MG tablet Taking 4 tablets by mouth as needed for arthritis.   metoprolol succinate (TOPROL-XL) 25 MG 24 hr tablet TAKE 1/2 TABLET BY MOUTH EVERY DAY   Multiple Vitamin (MULTIVITAMIN) tablet Take 1 tablet by mouth daily.   pantoprazole (PROTONIX) 40 MG tablet Take 1 tablet (40 mg total) by mouth daily. Take 30-60 min before first meal of the day   PARoxetine (PAXIL-CR) 25 MG 24 hr tablet Take 1 tablet (25 mg total) by mouth daily.   SYMBICORT 80-4.5 MCG/ACT inhaler Inhale 2 puffs into the lungs in the morning and at bedtime.                     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    NEGATIVE MUTATION 01/05/06   Depression    Dysplasia of cervix, unspecified    Herpes    Hyperprolactinemia (HCC)    Insomnia    Migraine    "2-3/year" (12/18/2015)   Paroxysmal atrial fibrillation (HCC)    TMJ (dislocation of temporomandibular joint)       Objective:      09/24/2021      159    08/13/21 162 lb (73.5 kg)  07/30/21 160 lb 12.8 oz (72.9 kg)  07/24/21 160 lb (72.6 kg)    Vital signs reviewed  09/24/2021  - Note at  rest 02 sats  94% on RA   General appearance:    amb healthy appearing wf nad   HEENT : Oropharynx  clear         NECK :  without  apparent JVD/ palpable Nodes/TM    LUNGS: no acc muscle use,  Nl contour chest which is clear to A and P bilaterally without cough on insp or exp maneuvers   CV:  RRR  no s3 or murmur or increase in P2, and no edema   ABD:  soft and nontender with nl inspiratory excursion in the supine position. No bruits or organomegaly appreciated   MS:  Nl gait/ ext warm without deformities Or obvious joint restrictions  calf tenderness, cyanosis or clubbing    SKIN: warm and dry without lesions    NEURO:  alert, approp, nl sensorium with  no motor or cerebellar deficits apparent.              Assessment

## 2021-09-24 NOTE — Assessment & Plan Note (Addendum)
Onset Jan 2020 acute but required saba short term, recurred late may 2023 - 08/13/2021  After extensive coaching inhaler device,  effectiveness =    90%  -  Allergy screen 08/13/2021 >  Eos 0.1 /  IgE  7 - FENO  09/24/2021   = 17 on symbicort 80 2bid > ok to taper as tol   All goals of chronic asthma control met including optimal function and elimination of symptoms with minimal need for rescue therapy.  Contingencies discussed in full including contacting this office immediately if not controlling the symptoms using the rule of two's.     Ok to taper symbicort 80  Based on two studies from NEJM  378; 20 p 1865 (2018) and 380 : p2020-30 (2019) in pts with mild asthma it is reasonable to use low dose symbicort eg 80 2bid "prn" flare in this setting but I emphasized this was only shown with symbicort and takes advantage of the rapid onset of action but is not the same as "rescue therapy" but can be stopped once the acute symptoms have resolved and the need for rescue has been minimized (< 2 x weekly)    F/u in 11m sooner if needed          Each maintenance medication was reviewed in detail including emphasizing most importantly the difference between maintenance and prns and under what circumstances the prns are to be triggered using an action plan format where appropriate.  Total time for H and P, chart review, counseling, reviewing hfa device(s) and generating customized AVS unique to this office visit / same day charting = 25 min

## 2021-10-06 ENCOUNTER — Other Ambulatory Visit: Payer: Self-pay | Admitting: *Deleted

## 2021-10-06 MED ORDER — PANTOPRAZOLE SODIUM 40 MG PO TBEC: 40.0000 mg | DELAYED_RELEASE_TABLET | Freq: Every day | ORAL | 5 refills | Status: DC

## 2021-10-13 ENCOUNTER — Other Ambulatory Visit: Payer: Self-pay | Admitting: Family Medicine

## 2021-10-13 DIAGNOSIS — F900 Attention-deficit hyperactivity disorder, predominantly inattentive type: Secondary | ICD-10-CM

## 2021-10-13 NOTE — Telephone Encounter (Signed)
Patient is requesting a refill of the following medications: Requested Prescriptions   Pending Prescriptions Disp Refills   amphetamine-dextroamphetamine (ADDERALL XR) 20 MG 24 hr capsule 30 capsule 0    Sig: Take 1 capsule (20 mg total) by mouth every morning.    Date of patient request: 10/13/21 Last office visit: 07/24/21 Date of last refill: 08/24/21 Last refill amount: 30

## 2021-10-13 NOTE — Telephone Encounter (Signed)
Encourage patient to contact the pharmacy for refills or they can request refills through [facility]  (Please schedule appointment if patient has not been seen in over a year)    WHAT PHARMACY WOULD THEY LIKE THIS SENT TO: [facility] [address] [phone]  MEDICATION NAME & DOSE: adderall xr 20 mg AND adderall 20 mg  NOTES/COMMENTS FROM PATIENT:      Front office please notify patient: It takes 48-72 hours to process rx refill requests Ask patient to call pharmacy to ensure rx is ready before heading there.

## 2021-10-14 MED ORDER — AMPHETAMINE-DEXTROAMPHET ER 20 MG PO CP24: 20.0000 mg | ORAL_CAPSULE | ORAL | 0 refills | Status: DC

## 2021-11-09 ENCOUNTER — Other Ambulatory Visit: Payer: Self-pay | Admitting: Family Medicine

## 2021-11-09 DIAGNOSIS — I9589 Other hypotension: Secondary | ICD-10-CM

## 2021-11-19 ENCOUNTER — Ambulatory Visit: Payer: BC Managed Care – PPO | Admitting: Physician Assistant

## 2021-12-07 ENCOUNTER — Telehealth: Payer: Self-pay | Admitting: Family Medicine

## 2021-12-07 NOTE — Telephone Encounter (Signed)
The XR is an extended release formulation and for most people does last all day.  The short acting medication is just for additional focus as the long acting medication is wearing off.  Mydayis is a medication that seems to last longer for people but insurance coverage is hit or miss.  She may want to check with her insurance or her pharmacy to see how much it would cost before we try and send it in (there is not an available generic)

## 2021-12-07 NOTE — Telephone Encounter (Signed)
Caller name: TEKILA CAILLOUET  On [place]?: Yes  Call back number: [phone] (mobile)  Provider they see: Midge Minium, MD  Reason for call: Pt stated that she takes Adderrall xr 20 mg in the morning and Adderrall 20 mg at lunch time. Pt wants to know if its there any other medication she can take that would last all day. Pt also stated that its hard to get both medication refilled at the same time bc of medication shortage at pharmacy. Pt states that sometime she can't take both medication, like she suppose to bc of the shortage.

## 2021-12-07 NOTE — Telephone Encounter (Signed)
Left  a Detailed VM stating DR Birdie Riddle message . I advised her to check with her insurance to see if they Cover Mydayis or check with the pharmacist to see how much the out of pocket cost would be and let us know

## 2021-12-07 NOTE — Telephone Encounter (Signed)
Pt wants to know if she can take another medication that last all day long.

## 2021-12-15 ENCOUNTER — Other Ambulatory Visit (HOSPITAL_COMMUNITY): Payer: Self-pay

## 2021-12-18 ENCOUNTER — Telehealth (INDEPENDENT_AMBULATORY_CARE_PROVIDER_SITE_OTHER): Payer: BC Managed Care – PPO | Admitting: Family Medicine

## 2021-12-18 ENCOUNTER — Encounter: Payer: Self-pay | Admitting: Family Medicine

## 2021-12-18 VITALS — Wt 155.0 lb

## 2021-12-18 DIAGNOSIS — F988 Other specified behavioral and emotional disorders with onset usually occurring in childhood and adolescence: Secondary | ICD-10-CM | POA: Diagnosis not present

## 2021-12-18 DIAGNOSIS — R413 Other amnesia: Secondary | ICD-10-CM

## 2021-12-18 MED ORDER — LISDEXAMFETAMINE DIMESYLATE 30 MG PO CAPS: 30.0000 mg | ORAL_CAPSULE | Freq: Every day | ORAL | 0 refills | Status: DC

## 2021-12-18 NOTE — Progress Notes (Signed)
Virtual Visit via Video   I connected with patient on 12/18/21 at 11:00 AM EST by a video enabled telemedicine application and verified that I am speaking with the correct person using two identifiers.  Location patient: Home Location provider: [place] [facility], Office Persons participating in the virtual visit: Patient, Provider, CMA Sheryle Hail C)  I discussed the limitations of evaluation and management by telemedicine and the availability of in person appointments. The patient expressed understanding and agreed to proceed.  Subjective:   HPI:   Memory loss- 'i think I'm losing my mind'.  Pt signed the wrong name in a guest book, is having trouble keeping her schedule straight.  Seems to do better at work but struggles when she is interrupted.  Pt has been off her Adderall b/c 'it wasn't lasting'.  Pt is concerned b/c best friend has early Alzheimer's and she feels it may be COVID related.  ROS:   See pertinent positives and negatives per HPI.  Patient Active Problem List   Diagnosis Date Noted   Overweight (BMI 25.0-29.9) 07/24/2021   Physical exam 07/24/2021   Cough variant asthma 07/06/2021   Chronic cough 07/05/2021   Paroxysmal atrial fibrillation (HCC) 01/16/2021   ADD (attention deficit disorder) 07/27/2019   Eustachian tube dysfunction, bilateral 03/30/2018   Anemia    Hypotension 12/18/2015   Acute kidney injury (HCC) 12/18/2015   Nausea and vomiting 12/18/2015   Chest pain 12/18/2015   Anxiety    Dehydration    Depression 08/08/2015   Acute maxillary sinusitis 02/14/2015   Atrial tachycardia 08/26/2013   Palpitations 08/26/2013   Acute bronchitis 06/15/2013   Atrial fibrillation with RVR (HCC)    DEPRESSION 11/15/2008   ALLERGIC RHINITIS 11/15/2008   FATIGUE 11/15/2008   HEADACHE 11/15/2008   HYPERPROLACTINEMIA, HX OF 11/15/2008   COLONIC POLYPS, HX OF 11/15/2008    Social History   Tobacco Use   Smoking status: Never   Smokeless tobacco: Never   Substance Use Topics   Alcohol use: Yes    Alcohol/week: 0.0 standard drinks of alcohol    Comment: 12/18/2015 "might have a glass of wine 2-3 times/month"    Current Outpatient Medications:    amphetamine-dextroamphetamine (ADDERALL XR) 20 MG 24 hr capsule, Take 1 capsule (20 mg total) by mouth every morning., Disp: 30 capsule, Rfl: 0   amphetamine-dextroamphetamine (ADDERALL) 20 MG tablet, Take 1 tablet (20 mg total) by mouth daily. In the afternoon for additional focus, Disp: 30 tablet, Rfl: 0   ELDERBERRY PO, Take by mouth. Takes 2 chewables by mouth daily, Disp: , Rfl:    famotidine (PEPCID) 20 MG tablet, One after supper, Disp: 30 tablet, Rfl: 11   flecainide (TAMBOCOR) 50 MG tablet, TAKE 1 TABLET BY MOUTH TWICE A DAY, Disp: 180 tablet, Rfl: 2   ibuprofen (ADVIL) 200 MG tablet, Taking 4 tablets by mouth as needed for arthritis., Disp: , Rfl:    metoprolol succinate (TOPROL-XL) 25 MG 24 hr tablet, TAKE 1/2 TABLET BY MOUTH EVERY DAY, Disp: 45 tablet, Rfl: 1   Multiple Vitamin (MULTIVITAMIN) tablet, Take 1 tablet by mouth daily., Disp: , Rfl:    pantoprazole (PROTONIX) 40 MG tablet, Take 1 tablet (40 mg total) by mouth daily. Take 30-60 min before first meal of the day, Disp: 30 tablet, Rfl: 5   PARoxetine (PAXIL-CR) 25 MG 24 hr tablet, TAKE 1 TABLET BY MOUTH EVERY DAY, Disp: 30 tablet, Rfl: 0   albuterol (VENTOLIN HFA) 108 (90 Base) MCG/ACT inhaler, Inhale 2 puffs  into the lungs every 6 (six) hours as needed for wheezing or shortness of breath. (Patient not taking: Reported on 12/18/2021), Disp: 8 g, Rfl: 1   SYMBICORT 80-4.5 MCG/ACT inhaler, Inhale 2 puffs into the lungs in the morning and at bedtime. (Patient not taking: Reported on 12/18/2021), Disp: 10.2 g, Rfl: 12  Allergies  Allergen Reactions   Sulfa Antibiotics Rash and Itching   Other Other (See Comments)   Sulfonamide Derivatives Rash    Objective:   Wt 155 lb (70.3 kg)   BMI 26.61 kg/m  AAOx3, NAD NCAT, EOMI No  obvious CN deficits Coloring WNL Pt is able to speak clearly, coherently without shortness of breath or increased work of breathing.  Thought process is linear.  Mood is appropriate.   Assessment and Plan:   ADHD- pt has not been taking her Adderall bc she didn't feel it was lasting long enough.  Suspect that some of her memory issues are due to her untreated ADHD and all that she has on her plate.  Will switch to Vyvanse 30mg  daily and monitor closely for improvement.  Pt expressed understanding and is in agreement w/ plan.   Memory issues- new.  Pt states she is having a lot of difficulty keeping her schedule straight and forgetting things that previously would not have been forgotten.  I suspect some of it is due to the fact she is off her Adderall.  Given her stress level and the amount of balls she has in the air right now, I think it's more distraction rather than true forgetfulness.  But will refer to neuro for complete evaluation.  Pt expressed understanding and is in agreement w/ plan.    , MD 12/18/2021

## 2021-12-19 ENCOUNTER — Other Ambulatory Visit: Payer: Self-pay | Admitting: Family Medicine

## 2021-12-19 DIAGNOSIS — I9589 Other hypotension: Secondary | ICD-10-CM

## 2021-12-23 ENCOUNTER — Encounter: Payer: Self-pay | Admitting: Family Medicine

## 2021-12-23 NOTE — Telephone Encounter (Signed)
Patient needs PA. ?

## 2021-12-28 ENCOUNTER — Telehealth: Payer: Self-pay

## 2021-12-28 ENCOUNTER — Other Ambulatory Visit: Payer: Self-pay

## 2021-12-28 ENCOUNTER — Other Ambulatory Visit (HOSPITAL_COMMUNITY): Payer: Self-pay

## 2021-12-28 DIAGNOSIS — I9589 Other hypotension: Secondary | ICD-10-CM

## 2021-12-28 MED ORDER — PAROXETINE HCL ER 25 MG PO TB24: 25.0000 mg | ORAL_TABLET | Freq: Every day | ORAL | 6 refills | Status: DC

## 2021-12-28 NOTE — Telephone Encounter (Signed)
Pharmacy Patient Advocate Encounter  Prior Authorization for Vyvanse 30 mg has been approved.   PA: [id number]   Effective dates: 12/28/2021 through 12/28/2024

## 2021-12-28 NOTE — Telephone Encounter (Signed)
Informed pt of the approval of her Vyvanse

## 2021-12-28 NOTE — Telephone Encounter (Signed)
Pharmacy Patient Advocate Encounter   Received notification from [facility] that prior authorization for Vyvnsae 30mg  is required/requested.   PA submitted on 12/28/21 to [employer]  via CoverMyMeds  Key: [id number] PA Case ID: 12/30/21  Status is pending

## 2021-12-31 ENCOUNTER — Other Ambulatory Visit: Payer: Self-pay

## 2021-12-31 ENCOUNTER — Ambulatory Visit: Payer: BC Managed Care – PPO | Admitting: Internal Medicine

## 2021-12-31 MED ORDER — LISDEXAMFETAMINE DIMESYLATE 30 MG PO CAPS: 30.0000 mg | ORAL_CAPSULE | Freq: Every day | ORAL | 0 refills | Status: DC

## 2021-12-31 NOTE — Telephone Encounter (Signed)
Called patient, no answer, LM informing her to go to the pharmacy

## 2021-12-31 NOTE — Telephone Encounter (Signed)
Patient called this morning notes her pharmacy in [facility] does not have the generic or brand name asking this be sent to the [facility] in [place] she notes that she has spoken to them and they confirmed stock.   Please re send

## 2022-01-07 ENCOUNTER — Ambulatory Visit: Payer: BC Managed Care – PPO | Admitting: Physician Assistant

## 2022-01-22 ENCOUNTER — Ambulatory Visit: Payer: BC Managed Care – PPO | Admitting: Family Medicine

## 2022-01-29 ENCOUNTER — Ambulatory Visit (HOSPITAL_COMMUNITY)
Admission: RE | Admit: 2022-01-29 | Discharge: 2022-01-29 | Disposition: A | Discharge status: Home / Self Care | Payer: BC Managed Care – PPO | Source: Ambulatory Visit | Attending: Physician Assistant | Admitting: Physician Assistant

## 2022-01-29 ENCOUNTER — Encounter (HOSPITAL_COMMUNITY): Payer: Self-pay | Admitting: Physician Assistant

## 2022-01-29 VITALS — BP 118/72 | HR 67 | Ht 64.0 in | Wt 161.0 lb

## 2022-01-29 DIAGNOSIS — Z79899 Other long term (current) drug therapy: Secondary | ICD-10-CM | POA: Insufficient documentation

## 2022-01-29 DIAGNOSIS — I480 Paroxysmal atrial fibrillation: Secondary | ICD-10-CM | POA: Insufficient documentation

## 2022-01-29 NOTE — Progress Notes (Signed)
Primary Care Physician: Midge Minium, MD Referring Physician: Dr. Casilda Carls is a 56 y.o. female with a h/o afib on flecainide for follow up. She has a CHA2DS2VASc score of 1. On follow up today, patient reports that she has done well from a cardiac standpoint. She has very brief (1-3 seconds) palpitations which are rare. She has an appointment with neurology next month to discuss recent memory issues.   Today, she denies symptoms of chest pain, shortness of breath, orthopnea, PND, lower extremity edema, dizziness, presyncope, syncope, or neurologic sequela. The patient is tolerating medications without difficulties and is otherwise without complaint today.   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    NEGATIVE MUTATION 01/05/06   Depression    Dysplasia of cervix, unspecified    Herpes    Hyperprolactinemia (HCC)    Insomnia    Migraine    "2-3/year" (12/18/2015)   Paroxysmal atrial fibrillation (HCC)    TMJ (dislocation of temporomandibular joint)    Past Surgical History:  Procedure Laterality Date   AUGMENTATION MAMMAPLASTY Bilateral 1998   BREAST BIOPSY Bilateral 2001-2003   RIGHT BREAST BIOPSY 2001-PAPILOMA/LT BR BXY-BENIGN 2003   CERVIX LESION DESTRUCTION  ~ [place] Right ~ 2008   EYE SURGERY     FRACTURE SURGERY     HYSTEROSCOPY N/A 07/27/2013   Procedure: HYSTEROSCOPY WITH HYDROTHERMAL ABLATION;  Surgeon: Terrance Mass, MD;  Location: [facility];  Service: Gynecology;  Laterality: N/A;   LAPAROSCOPIC CHOLECYSTECTOMY  2011   ORIF CLAVICLE FRACTURE Right ~ 2008   CLAVICLE FRACTURE-TRAUMATIC   REFRACTIVE SURGERY Bilateral 1990s   SHOULDER ARTHROSCOPY Right 2016   "labrium cartilage repair"   SHOULDER ARTHROSCOPY WITH ROTATOR CUFF REPAIR Right 2000   TRIGGER FINGER RELEASE Right    thumb   tube in ear Left     Current Outpatient Medications   Medication Sig Dispense Refill   albuterol (VENTOLIN HFA) 108 (90 Base) MCG/ACT inhaler Inhale 2 puffs into the lungs every 6 (six) hours as needed for wheezing or shortness of breath. 8 g 1   amphetamine-dextroamphetamine (ADDERALL) 20 MG tablet Take 1 tablet (20 mg total) by mouth daily. In the afternoon for additional focus 30 tablet 0   celecoxib (CELEBREX) 200 MG capsule Take 200 mg by mouth 2 (two) times daily.     ELDERBERRY PO Take by mouth. Takes 2 chewables by mouth daily     famotidine (PEPCID) 20 MG tablet One after supper 30 tablet 11   flecainide (TAMBOCOR) 50 MG tablet TAKE 1 TABLET BY MOUTH TWICE A DAY 180 tablet 2   lisdexamfetamine (VYVANSE) 30 MG capsule Take 1 capsule (30 mg total) by mouth daily. 30 capsule 0   metoprolol succinate (TOPROL-XL) 25 MG 24 hr tablet TAKE 1/2 TABLET BY MOUTH EVERY DAY 45 tablet 1   Multiple Vitamin (MULTIVITAMIN) tablet Take 1 tablet by mouth daily.     pantoprazole (PROTONIX) 40 MG tablet Take 1 tablet (40 mg total) by mouth daily. Take 30-60 min before first meal of the day 30 tablet 5   PARoxetine (PAXIL-CR) 25 MG 24 hr tablet Take 1 tablet (25 mg total) by mouth daily. 30 tablet 6   SYMBICORT 80-4.5 MCG/ACT inhaler Inhale 2 puffs into the lungs in the morning and at bedtime. 10.2 g 12   No current facility-administered medications  for this encounter.    Allergies  Allergen Reactions   Sulfa Antibiotics Rash and Itching   Other Other (See Comments)   Sulfonamide Derivatives Rash    Social History   Socioeconomic History   Marital status: Married    Spouse name: Not on file   Number of children: Not on file   Years of education: Not on file   Highest education level: Not on file  Occupational History   Not on file  Tobacco Use   Smoking status: Never   Smokeless tobacco: Never   Tobacco comments:    Never smoke 01/29/22  Vaping Use   Vaping Use: Never used  Substance and Sexual Activity   Alcohol use: Yes     Alcohol/week: 0.0 standard drinks of alcohol    Comment: 12/18/2015 "might have a glass of wine 2-3 times/month"   Drug use: No   Sexual activity: Yes    Partners: Male    Birth control/protection: Condom    Comment: 1st intercourse- 73, partners- 54, married- 67 yrs  Other Topics Concern   Not on file  Social History Narrative   Not on file   Social Determinants of Health   Financial Resource Strain: Not on file  Food Insecurity: Not on file  Transportation Needs: Not on file  Physical Activity: Not on file  Stress: Not on file  Social Connections: Not on file  Intimate Partner Violence: Not on file    Family History  Problem Relation Age of Onset   Breast cancer Mother 49   Hypertension Father    Breast cancer Maternal Aunt 78   Atrial fibrillation Brother     ROS- All systems are reviewed and negative except as per the HPI above  Physical Exam: Vitals:   01/29/22 0927  BP: 118/72  Pulse: 67  Weight: 73 kg  Height: _0  (1.626 m)     Wt Readings from Last 3 Encounters:  01/29/22 73 kg  12/18/21 70.3 kg  09/24/21 72.1 kg    Labs: Lab Results  Component Value Date   NA 138 07/17/2021   K 4.9 07/17/2021   CL 98 07/17/2021   CO2 31 07/17/2021   GLUCOSE 86 07/17/2021   BUN 18 07/17/2021   CREATININE 1.08 07/17/2021   CALCIUM 9.7 07/17/2021   Lab Results  Component Value Date   INR 1.18 04/01/2011   Lab Results  Component Value Date   CHOL 226 (H) 07/24/2021   HDL 83.60 07/24/2021   LDLCALC 128 (H) 07/24/2021   TRIG 72.0 07/24/2021    GEN- The patient is a well appearing female, alert and oriented x 3 today.   HEENT-head normocephalic, atraumatic, sclera clear, conjunctiva pink, hearing intact, trachea midline. Lungs- Clear to ausculation bilaterally, normal work of breathing Heart- Regular rate and rhythm, no murmurs, rubs or gallops  GI- soft, NT, ND, + BS Extremities- no clubbing, cyanosis, or edema MS- no significant deformity or  atrophy Skin- no rash or lesion Psych- euthymic mood, full affect Neuro- strength and sensation are intact    EKG-  SR Vent. rate 67 BPM PR interval 152 ms QRS duration 86 ms QT/QTcB 400/422 ms   CHA2DS2-VASc Score = 1  The patient's score is based upon: CHF History: 0 HTN History: 0 Diabetes History: 0 Stroke History: 0 Vascular Disease History: 0 Age Score: 0 Gender Score: 1       ASSESSMENT AND PLAN: 1. Paroxysmal Atrial Fibrillation/atrial tach The patient's CHA2DS2-VASc score is 1, indicating  a 0.6% annual risk of stroke.   Patient appears to be maintaining SR.  Continue flecainide 50 mg BID  Continue metoprolol 12.5 mg qd  Anticoagulation not indicated at this time with low CV score.     Follow up in the AF clinic in one year. EP APP in 6 months.    [facility] [address] [place], [place] [address] [phone]

## 2022-02-05 ENCOUNTER — Encounter: Payer: Self-pay | Admitting: Family Medicine

## 2022-02-09 MED ORDER — LISDEXAMFETAMINE DIMESYLATE 40 MG PO CAPS: 40.0000 mg | ORAL_CAPSULE | ORAL | 0 refills | Status: DC

## 2022-02-24 ENCOUNTER — Other Ambulatory Visit (HOSPITAL_COMMUNITY): Payer: Self-pay

## 2022-02-25 ENCOUNTER — Ambulatory Visit: Payer: BC Managed Care – PPO | Admitting: Diagnostic Neuroimaging

## 2022-02-25 ENCOUNTER — Ambulatory Visit: Payer: BC Managed Care – PPO | Admitting: Internal Medicine

## 2022-02-25 ENCOUNTER — Telehealth: Payer: Self-pay | Admitting: Diagnostic Neuroimaging

## 2022-02-25 ENCOUNTER — Telehealth: Payer: Self-pay

## 2022-02-25 ENCOUNTER — Other Ambulatory Visit (HOSPITAL_COMMUNITY): Payer: Self-pay

## 2022-02-25 NOTE — Telephone Encounter (Signed)
Pt is calling. Stated she is sick and can't make appointment today.

## 2022-02-25 NOTE — Telephone Encounter (Signed)
Pharmacy Patient Advocate Encounter   Received notification from [facility] that prior authorization for Vyvanse 40mg  (generic) is required/requested.  Per Test Claim: Prior auth required   PA submitted on 02/25/22 to (ins) [employer] via [employer] - PA Case ID: [id number] Status is pending

## 2022-03-01 ENCOUNTER — Other Ambulatory Visit (HOSPITAL_COMMUNITY): Payer: Self-pay

## 2022-03-01 NOTE — Telephone Encounter (Signed)
Patient Advocate Encounter  Prior Authorization for Lisdexamfetamine 40mg  has been approved.    PA# PA Case ID: [id number]  Effective dates: 02/25/22 through 02/25/25  Placed a call to the pharmacy to notify of the approval. Spoke with Jenny Reichmann and he stated the medication is on a backorder.

## 2022-03-01 NOTE — Telephone Encounter (Signed)
Called and LM informing pt this was approved

## 2022-03-11 ENCOUNTER — Encounter (HOSPITAL_COMMUNITY): Payer: Self-pay | Admitting: *Deleted

## 2022-03-15 ENCOUNTER — Encounter: Payer: Self-pay | Admitting: Family Medicine

## 2022-03-15 DIAGNOSIS — F900 Attention-deficit hyperactivity disorder, predominantly inattentive type: Secondary | ICD-10-CM

## 2022-03-19 ENCOUNTER — Other Ambulatory Visit: Payer: Self-pay | Admitting: Family Medicine

## 2022-03-19 MED ORDER — LISDEXAMFETAMINE DIMESYLATE 30 MG PO CAPS: 30.0000 mg | ORAL_CAPSULE | Freq: Every day | ORAL | 0 refills | Status: DC

## 2022-03-19 MED ORDER — AMPHETAMINE-DEXTROAMPHET ER 20 MG PO CP24: 20.0000 mg | ORAL_CAPSULE | ORAL | 0 refills | Status: DC

## 2022-03-19 MED ORDER — AMPHETAMINE-DEXTROAMPHETAMINE 20 MG PO TABS: 20.0000 mg | ORAL_TABLET | Freq: Every day | ORAL | 0 refills | Status: DC

## 2022-03-19 NOTE — Telephone Encounter (Signed)
Vyvanse 40 mg LOV: 12/18/21 Last Refill:02/09/22 Upcoming appt: none

## 2022-03-19 NOTE — Telephone Encounter (Signed)
Pt was notified.  

## 2022-03-19 NOTE — Addendum Note (Signed)
Addended by: Midge Minium on: 03/19/2022 04:22 PM   Modules accepted: Orders

## 2022-03-19 NOTE — Telephone Encounter (Signed)
Encourage patient to contact the pharmacy for refills or they can request refills through [facility]: [facility] [place] [address] [phone]  MEDICATION NAME & DOSE:lisdexamfetamine (VYVANSE) 40 MG capsule   NOTES/COMMENTS FROM PATIENT: Pt found 20m dose is that alright to take can't find 446m?       [facility] please notify patient: It takes 48-72 hours to process rx refill requests Ask patient to call pharmacy to ensure rx is ready before heading there.

## 2022-03-21 ENCOUNTER — Other Ambulatory Visit (HOSPITAL_COMMUNITY): Payer: Self-pay | Admitting: Nurse Practitioner

## 2022-03-21 NOTE — Progress Notes (Unsigned)
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 Albuterol neg covid x one and neg flu   History of Present Illness  07/06/2021  Pulmonary/ 1st office eval/Mavin Dyke  Chief Complaint  Patient presents with   Pulmonary Consult    Self referral for cough. She c/o cough x 2 wks- occ prod with clear sputum. She just completed Zpack today and currently on pred taper.   Dyspnea:  feels shallow Cough: not productive  Sleep: coughing at hs  SABA use: not on day of day  Overt hb since onset of cough was not a problem prior Gen cp / tightness during severe coughing fits  Rec Finish the prednsione  [employer]  Take 2 puffs first thing in am and then another 2 puffs about 12 hours later.  Work on inhaler technique:   Only use your albuterol as a rescue medication  Pantoprazole (protonix) 40 mg   Take  30-60 min before first meal of the day and Pepcid (famotidine)  20 mg after supper until return to office  GERD diet Tussionex 1tsp up to every 4 -6 hours until no longer coughing x 48 hours  then as needed  Please schedule a follow up office visit in 3-4  weeks, sooner if needed     08/13/2021  f/u ov/Kare Dado re: cough since 06/22/21    maint on symbicort 80  2bid   Chief Complaint  Patient presents with   Follow-up    Cough is much improved. She has occ cough now, prod at times with thick, yellow sputum. She has occ, mild SOB and uses her albuterol inhaler about 2 x per wk.    Dyspnea:  Not limited by breathing from desired activities   Cough: p supper  and at hs still coughs, occ thick and yellow  Sleeping: flat bed 2 pillows ok  SABA use: up to twice weekly   02: no  Covid status:   x 2 vax Rec Augmentin 875 mg take one pill twice daily  X 10 days   If not better next step is Sinus CT> not done as of 09/24/2021         09/24/2021  f/u ov/Jailen Coward re: cough variant asthma  maint on symbicort 80 2bid   Chief Complaint  Patient presents with   Follow-up    Follow up. Patient has no complaints.    Dyspnea:  no aerobics but not limited in any way by sob Cough: minimal dry cough/ urge to clear throat, no long cough on exp maneuvers   Sleeping: flat bed 2 pillows no cough  SABA use: not at all  02: none  Rec Try symbicort 80  0-2 puffs up to every 12 hours  Please schedule a follow up visit in 3 months but call sooner if needed    03/22/2022  f/u ov/Francely Craw re: ***   maint on ***  No chief complaint on file.   Dyspnea:  *** Cough: *** Sleeping: ***  SABA use: *** 02: *** Covid status:   *** Lung cancer screening :  ***    No obvious day to day or daytime variability or assoc excess/ purulent sputum or mucus plugs or hemoptysis or cp or chest tightness, subjective wheeze or overt sinus or hb symptoms.   *** without nocturnal  or early am exacerbation  of respiratory  c/o's or need for noct saba. Also denies any obvious fluctuation of symptoms with weather or environmental changes or other aggravating or alleviating factors except as outlined above   No unusual exposure hx or h/o childhood pna/ asthma or knowledge of premature birth.  Current Allergies, Complete Past Medical History, Past Surgical History, Family History, and Social History were reviewed in [employer] record.  ROS  The following are not active complaints unless bolded Hoarseness, sore throat, dysphagia, dental problems, itching, sneezing,  nasal congestion or discharge of excess mucus or purulent secretions, ear ache,   fever, chills, sweats,  unintended wt loss or wt gain, classically pleuritic or exertional cp,  orthopnea pnd or arm/hand swelling  or leg swelling, presyncope, palpitations, abdominal pain, anorexia, nausea, vomiting, diarrhea  or change in bowel habits or change in bladder habits, change in stools or change in urine, dysuria, hematuria,  rash, arthralgias, visual complaints, headache, numbness, weakness or ataxia or problems with walking or coordination,  change in mood or  memory.        No outpatient medications have been marked as taking for the 03/22/22 encounter (Appointment) with Tanda Rockers, MD.                      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    NEGATIVE MUTATION 01/05/06   Depression    Dysplasia of cervix, unspecified    Herpes    Hyperprolactinemia ([place])    Insomnia    Migraine    "2-3/year" (12/18/2015)   Paroxysmal atrial fibrillation (HCC)    TMJ (dislocation of temporomandibular joint)       Objective:    Wts  03/22/2022        ***   09/24/2021      159    08/13/21 162 lb (73.5 kg)  07/30/21 160 lb 12.8 oz (72.9 kg)  07/24/21 160 lb (72.6 kg)      Vital signs reviewed  03/22/2022  - Note at rest 02 sats  ***% on ***   General appearance:    ***              Assessment

## 2022-03-22 ENCOUNTER — Ambulatory Visit: Payer: 59 | Admitting: Internal Medicine

## 2022-03-22 ENCOUNTER — Encounter: Payer: Self-pay | Admitting: Internal Medicine

## 2022-03-22 VITALS — BP 118/72 | HR 95 | Temp 98.1°F | Ht 63.0 in | Wt 161.8 lb

## 2022-03-22 DIAGNOSIS — J45991 Cough variant asthma: Secondary | ICD-10-CM

## 2022-03-22 MED ORDER — AIRSUPRA 90-80 MCG/ACT IN AERO: 10.7000 g | INHALATION_SPRAY | RESPIRATORY_TRACT | 11 refills | Status: AC | PRN

## 2022-03-22 MED ORDER — PANTOPRAZOLE SODIUM 40 MG PO TBEC: 40.0000 mg | DELAYED_RELEASE_TABLET | Freq: Every day | ORAL | Status: DC

## 2022-03-22 MED ORDER — FAMOTIDINE 20 MG PO TABS: ORAL_TABLET | ORAL | Status: DC

## 2022-03-22 NOTE — Patient Instructions (Signed)
Air supra is up to 2 puffs every 4 hours as needed   Pantoprazole (protonix) 40 mg   Take  30-60 min before first meal of the day and Pepcid (famotidine)  20 mg after supper until no cough for a week   Please schedule a follow up visit in 3 months but call sooner if needed

## 2022-03-23 NOTE — Assessment & Plan Note (Addendum)
Onset Jan 2020 acute but required saba short term, recurred late may 2023 - 08/13/2021  After extensive coaching inhaler device,  effectiveness =    90%  -  Allergy screen 08/13/2021 >  Eos 0.1 /  IgE  7 - FENO  09/24/2021   = 17 on symbicort 80 2bid > ok to taper as tol  - 03/22/2022 rec trial of air supra   Main problem is throat clearing, not asthma, so try the least amt of saba/ICS possible and   rx  for UACS with acid suppression/ hard rock candy/ consider gabapentin trial vs refer to voice center at [facility] if persists         Each maintenance medication was reviewed in detail including emphasizing most importantly the difference between maintenance and prns and under what circumstances the prns are to be triggered using an action plan format where appropriate.  Total time for H and P, chart review, counseling, reviewing hfa device(s) and generating customized AVS unique to this office visit / same day charting = 25 min

## 2022-03-26 ENCOUNTER — Ambulatory Visit: Payer: BC Managed Care – PPO | Admitting: Internal Medicine

## 2022-04-12 ENCOUNTER — Other Ambulatory Visit: Payer: Self-pay | Admitting: Family Medicine

## 2022-04-12 DIAGNOSIS — I9589 Other hypotension: Secondary | ICD-10-CM

## 2022-04-22 ENCOUNTER — Telehealth: Payer: Self-pay | Admitting: Diagnostic Neuroimaging

## 2022-04-23 ENCOUNTER — Other Ambulatory Visit: Payer: Self-pay | Admitting: Family Medicine

## 2022-04-23 DIAGNOSIS — F900 Attention-deficit hyperactivity disorder, predominantly inattentive type: Secondary | ICD-10-CM

## 2022-04-23 MED ORDER — AMPHETAMINE-DEXTROAMPHET ER 20 MG PO CP24: 20.0000 mg | ORAL_CAPSULE | ORAL | 0 refills | Status: DC

## 2022-04-23 MED ORDER — AMPHETAMINE-DEXTROAMPHETAMINE 20 MG PO TABS: 20.0000 mg | ORAL_TABLET | Freq: Every day | ORAL | 0 refills | Status: DC

## 2022-04-23 NOTE — Telephone Encounter (Signed)
Adderall XR 20 mg LOV: 12/18/21 Last Refill:03/19/22 Upcoming appt: 05/14/22  Adderall 20 mg  Filled 03/19/22

## 2022-04-23 NOTE — Telephone Encounter (Signed)
Pt aware Rx has been sent in.  

## 2022-05-14 ENCOUNTER — Encounter: Payer: Self-pay | Admitting: Family Medicine

## 2022-05-14 ENCOUNTER — Ambulatory Visit: Payer: 59 | Admitting: Family Medicine

## 2022-05-14 VITALS — BP 122/78 | HR 80 | Temp 97.8°F | Resp 17 | Ht 63.0 in | Wt 157.2 lb

## 2022-05-14 DIAGNOSIS — G4700 Insomnia, unspecified: Secondary | ICD-10-CM | POA: Diagnosis not present

## 2022-05-14 MED ORDER — TRAZODONE HCL 50 MG PO TABS: 25.0000 mg | ORAL_TABLET | Freq: Every evening | ORAL | 3 refills | Status: DC | PRN

## 2022-05-14 NOTE — Patient Instructions (Signed)
Schedule your complete physical in July START the Trazodone nightly- start w/ 1/2 tab and increase to full tab if needed If you don't like the medicine or it's not working- let me know b/c there are other options If the hot flashes remain bothersome or worsen- let me know b/c there is a new medication Allyne Gee) that we could try Ask GYN about her thoughts on HRT given your family history Call with any questions or concerns Stay Safe!  Stay Healthy! Hang in there!!!

## 2022-05-14 NOTE — Progress Notes (Signed)
   Subjective:    Patient ID: Jenna Cortez, female    DOB: 08/17/1965, 57 y.o.   MRN: [id number]  HPI ? HRT- pt reports sleep patterns seem to be cyclical.  Also having hot flashes.  This is better on the Paxil but still occur.  Decreased libido.  Pt is wondering about HRT.  No longer having cycles- had ablation years ago.  + family hx of breast cancer- mom (dx'd at 74)  Currently is most concerned about sleep difficulties   Review of Systems For ROS see HPI     Objective:   Physical Exam Vitals reviewed.  Constitutional:      General: She is not in acute distress.    Appearance: Normal appearance. She is not ill-appearing.  HENT:     Head: Normocephalic and atraumatic.  Eyes:     Extraocular Movements: Extraocular movements intact.     Conjunctiva/sclera: Conjunctivae normal.  Cardiovascular:     Rate and Rhythm: Normal rate.  Pulmonary:     Effort: Pulmonary effort is normal. No respiratory distress.  Skin:    General: Skin is warm and dry.  Neurological:     General: No focal deficit present.     Mental Status: She is alert and oriented to person, place, and time.  Psychiatric:        Mood and Affect: Mood normal.        Behavior: Behavior normal.        Thought Content: Thought content normal.           Assessment & Plan:  Insomnia- new.  After long discussion about impact of perimenopause/menopause on sleep pt is willing to try sleep medication.  Will start w/ Trazodone to avoid controlled substances or medications that are habit forming.  Told her that given her family hx I would not start her on HRT as her mother had breast cancer at age 33.  Hot flashes are improving on Paxil but did discuss Veozah if they worsen.  Pt expressed understanding and is in agreement w/ plan.

## 2022-05-24 ENCOUNTER — Ambulatory Visit: Payer: BC Managed Care – PPO | Admitting: Diagnostic Neuroimaging

## 2022-05-25 ENCOUNTER — Other Ambulatory Visit: Payer: Self-pay | Admitting: Physician Assistant

## 2022-05-27 ENCOUNTER — Ambulatory Visit: Payer: BC Managed Care – PPO | Admitting: Obstetrics & Gynecology

## 2022-05-28 ENCOUNTER — Ambulatory Visit: Payer: BC Managed Care – PPO | Admitting: Diagnostic Neuroimaging

## 2022-06-11 ENCOUNTER — Other Ambulatory Visit: Payer: Self-pay | Admitting: Family Medicine

## 2022-06-11 DIAGNOSIS — Z1231 Encounter for screening mammogram for malignant neoplasm of breast: Secondary | ICD-10-CM

## 2022-06-11 DIAGNOSIS — F900 Attention-deficit hyperactivity disorder, predominantly inattentive type: Secondary | ICD-10-CM

## 2022-06-11 MED ORDER — AMPHETAMINE-DEXTROAMPHET ER 20 MG PO CP24
20.0000 mg | ORAL_CAPSULE | ORAL | 0 refills | Status: DC
Start: 2022-06-11 — End: 2022-11-17

## 2022-06-11 MED ORDER — AMPHETAMINE-DEXTROAMPHETAMINE 20 MG PO TABS: 20.0000 mg | ORAL_TABLET | Freq: Every day | ORAL | 0 refills | Status: DC

## 2022-06-11 NOTE — Telephone Encounter (Signed)
Pt aware refills has been sent in

## 2022-06-11 NOTE — Telephone Encounter (Signed)
Addearll 20 mg and Adderall XR 20 mg LOV: 05/14/22 Last Refill:04/23/22  Upcoming appt: none  Pt is a Dr Jenna Cortez pt can you fill in her absence ? Pt does take both strength's of Adderall  [facility] [place]

## 2022-06-14 ENCOUNTER — Other Ambulatory Visit: Payer: Self-pay | Admitting: Family Medicine

## 2022-06-18 ENCOUNTER — Ambulatory Visit (INDEPENDENT_AMBULATORY_CARE_PROVIDER_SITE_OTHER): Payer: 59 | Admitting: Obstetrics & Gynecology

## 2022-06-18 ENCOUNTER — Encounter: Payer: Self-pay | Admitting: Obstetrics & Gynecology

## 2022-06-18 ENCOUNTER — Other Ambulatory Visit (HOSPITAL_COMMUNITY)
Admission: RE | Admit: 2022-06-18 | Discharge: 2022-06-18 | Disposition: A | Discharge status: Home / Self Care | Payer: 59 | Source: Ambulatory Visit | Attending: Obstetrics & Gynecology | Admitting: Obstetrics & Gynecology

## 2022-06-18 VITALS — BP 110/64 | HR 72 | Resp 16 | Ht 63.5 in | Wt 154.0 lb

## 2022-06-18 DIAGNOSIS — Z01419 Encounter for gynecological examination (general) (routine) without abnormal findings: Secondary | ICD-10-CM | POA: Diagnosis present

## 2022-06-18 DIAGNOSIS — N952 Postmenopausal atrophic vaginitis: Secondary | ICD-10-CM | POA: Diagnosis not present

## 2022-06-18 DIAGNOSIS — Z780 Asymptomatic menopausal state: Secondary | ICD-10-CM

## 2022-06-18 MED ORDER — ESTRADIOL 10 MCG VA TABS: 1.0000 | ORAL_TABLET | VAGINAL | 4 refills | Status: DC

## 2022-06-18 NOTE — Progress Notes (Signed)
Jenna Cortez 08/30/1965 [id number]   History:    57 y.o. G2P2L2 Married.  Daughter is 92 yo.   RP:  Established patient presenting for Annual/Gynecologic exam   HPI: Post menopause x5 years, well on no hormone replacement therapy.  No postmenopausal bleeding.  No pelvic pain. Dryness/pain with intercourse even with coconut oil.  Will start Estradiol vaginal tab.  Pap Neg in 12/2018.  Pap reflex today. Urine and bowel movements normal. Colono 10/2017. Breasts normal status post bilateral implants.  Mammo Neg 04/2021.  Scheduled Mammo 06/2022. Mother with diagnosis of breast cancer at age 79. Patient is BRCA1 and BRCA2 negative.  Body mass index 26.85. Exercises regularly. Patient on Paxil for anxiety.  Health labs with Dr. Beverely Low.   Past medical history,surgical history, family history and social history were all reviewed and documented in the EPIC chart.  Gynecologic History No LMP recorded. Patient has had an ablation.  Obstetric History OB History  Gravida Para Term Preterm AB Living  2 2 2     2   SAB IAB Ectopic Multiple Live Births          2    # Outcome Date GA Lbr Len/2nd Weight Sex Delivery Anes PTL Lv  2 Term     M Vag-Spont  N LIV  1 Term     F Vag-Spont  N LIV     ROS: A ROS was performed and pertinent positives and negatives are included in the history. GENERAL: No fevers or chills. HEENT: No change in vision, no earache, sore throat or sinus congestion. NECK: No pain or stiffness. CARDIOVASCULAR: No chest pain or pressure. No palpitations. PULMONARY: No shortness of breath, cough or wheeze. GASTROINTESTINAL: No abdominal pain, nausea, vomiting or diarrhea, melena or bright red blood per rectum. GENITOURINARY: No urinary frequency, urgency, hesitancy or dysuria. MUSCULOSKELETAL: No joint or muscle pain, no back pain, no recent trauma. DERMATOLOGIC: No rash, no itching, no lesions. ENDOCRINE: No polyuria, polydipsia, no heat or cold intolerance. No recent change in weight.  HEMATOLOGICAL: No anemia or easy bruising or bleeding. NEUROLOGIC: No headache, seizures, numbness, tingling or weakness. PSYCHIATRIC: No depression, no loss of interest in normal activity or change in sleep pattern.     Exam:   BP 110/64   Pulse 72   Resp 16   Ht 5' 3.5" (1.613 m)   Wt 154 lb (69.9 kg)   BMI 26.85 kg/m   Body mass index is 26.85 kg/m.  General appearance : Well developed well nourished female. No acute distress HEENT: Eyes: no retinal hemorrhage or exudates,  Neck supple, trachea midline, no carotid bruits, no thyroidmegaly Lungs: Clear to auscultation, no rhonchi or wheezes, or rib retractions  Heart: Regular rate and rhythm, no murmurs or gallops Breast:Examined in sitting and supine position were symmetrical in appearance, no palpable masses or tenderness,  no skin retraction, no nipple inversion, no nipple discharge, no skin discoloration, no axillary or supraclavicular lymphadenopathy Abdomen: no palpable masses or tenderness, no rebound or guarding Extremities: no edema or skin discoloration or tenderness  Pelvic: Vulva: Normal             Vagina: No gross lesions or discharge  Cervix: No gross lesions or discharge.  Pap reflex done.  Uterus  AV, normal size, shape and consistency, non-tender and mobile  Adnexa  Without masses or tenderness  Anus: Normal   Assessment/Plan:  57 y.o. female for annual exam   1. Encounter for routine gynecological examination with  Papanicolaou smear of cervix Post menopause x5 years, well on no hormone replacement therapy.  No postmenopausal bleeding.  No pelvic pain. Dryness/pain with intercourse even with coconut oil.  Will start Estradiol vaginal tab.  Pap Neg in 12/2018.  Pap reflex today. Urine and bowel movements normal. Colono 10/2017. Breasts normal status post bilateral implants.  Mammo Neg 04/2021.  Scheduled Mammo 06/2022. Mother with diagnosis of breast cancer at age 104. Patient is BRCA1 and BRCA2 negative.  Body mass  index 26.85. Exercises regularly. Patient on Paxil for anxiety.  Health labs with Dr. Beverely Low. - Cytology - PAP( [place])  2. Postmenopause Post menopause x5 years, well on no hormone replacement therapy.  No postmenopausal bleeding.  No pelvic pain.   3. Postmenopausal atrophic vaginitis Dryness/pain with intercourse even with coconut oil.  Will start Estradiol vaginal tab twice a week.  Usage/risks/benefits reviewed.  Prescription sent to pharmacy.  Other orders - Estradiol 10 MCG TABS vaginal tablet; Place 1 tablet (10 mcg total) vaginally 2 (two) times a week.   Genia Del MD, 10:52 AM

## 2022-06-24 ENCOUNTER — Ambulatory Visit: Payer: 59 | Admitting: Diagnostic Neuroimaging

## 2022-06-24 ENCOUNTER — Encounter: Payer: Self-pay | Admitting: Diagnostic Neuroimaging

## 2022-06-24 ENCOUNTER — Telehealth: Payer: Self-pay | Admitting: Diagnostic Neuroimaging

## 2022-06-24 VITALS — BP 113/70 | HR 69 | Ht 63.5 in | Wt 154.0 lb

## 2022-06-24 DIAGNOSIS — R413 Other amnesia: Secondary | ICD-10-CM | POA: Diagnosis not present

## 2022-06-24 DIAGNOSIS — R41840 Attention and concentration deficit: Secondary | ICD-10-CM | POA: Diagnosis not present

## 2022-06-24 NOTE — Progress Notes (Signed)
[facility]  PATIENT: Jenna Cortez DOB: [birth year]  REFERRING CLINICIAN: Sheliah Hatch, MD HISTORY FROM: patient  REASON FOR VISIT: new consult   HISTORICAL  CHIEF COMPLAINT:  Chief Complaint  Patient presents with   Memory Loss    Rm 6, alone Pt has noticed memory loss x 1 yr  post-covid. Forgetting names, faces, hard time remembering words. Short term memory. Pt has fallen several times losing balance.     HISTORY OF PRESENT ILLNESS:   57 year old female here for evaluation of memory and cognitive difficulties.  Around 2021 patient had some complaints of attention difficulties, forgetfulness, cognitive difficulty, was recommended by her spouse to get some medical attention and treatment for it.  She saw PCP and was diagnosed with possible attention deficit disorder and tried on medication.  She tried a variety medications for ADD over the years with some variable results.  Also with long history of depression anxiety symptoms, on Paxil for many years, relatively stable.  Had some more issues with sleep lately and was tried on trazodone as a sleep aid.  In May 2023 patient had some type of viral syndrome, possible COVID although she tested negative.  Since that time she has had more issues with brain fog and cognitive difficulties.  She also is felt that her balance is up and off somewhat.  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  Patient reports being a very good student when she was younger.  She did not have any intention problems in school.  She was able to complete schooling and pursue a career as a Armed forces operational officer for many years.   REVIEW OF SYSTEMS: Full 14 system review of systems performed and negative with exception of: as per HPI.  ALLERGIES: Allergies  Allergen Reactions   Sulfa Antibiotics Rash and Itching    HOME MEDICATIONS: Outpatient Medications Prior to Visit   Medication Sig Dispense Refill   Albuterol-Budesonide (AIRSUPRA) 90-80 MCG/ACT AERO Inhale 10.7 g into the lungs every 4 (four) hours as needed. 10.7 g 11   amphetamine-dextroamphetamine (ADDERALL XR) 20 MG 24 hr capsule Take 1 capsule (20 mg total) by mouth every morning. 30 capsule 0   amphetamine-dextroamphetamine (ADDERALL) 20 MG tablet Take 1 tablet (20 mg total) by mouth daily. In the afternoon for additional focus 30 tablet 0   Estradiol 10 MCG TABS vaginal tablet Place 1 tablet (10 mcg total) vaginally 2 (two) times a week. 24 tablet 4   famotidine (PEPCID) 20 MG tablet One after supper     flecainide (TAMBOCOR) 50 MG tablet TAKE 1 TABLET BY MOUTH TWICE A DAY 180 tablet 1   metoprolol succinate (TOPROL-XL) 25 MG 24 hr tablet TAKE 1/2 TABLET BY MOUTH EVERY DAY 45 tablet 3   Multiple Vitamin (MULTIVITAMIN) tablet Take 1 tablet by mouth daily.     pantoprazole (PROTONIX) 40 MG tablet Take 1 tablet (40 mg total) by mouth daily. Take 30-60 min before first meal of the day     PARoxetine (PAXIL-CR) 25 MG 24 hr tablet TAKE 1 TABLET (25 MG TOTAL) BY MOUTH DAILY. 90 tablet 3   traZODone (DESYREL) 50 MG tablet TAKE 0.5-1 TABLETS BY MOUTH AT BEDTIME AS NEEDED FOR SLEEP. 90 tablet 2   No facility-administered medications prior to visit.    PAST MEDICAL HISTORY: 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    NEGATIVE MUTATION 01/05/06  BRCA2 negative    NEGATIVE MUTATION 01/05/06   Depression    Dysplasia of cervix, unspecified    Herpes    Hyperprolactinemia (HCC)    Insomnia    Migraine    "2-3/year" (12/18/2015)   Paroxysmal atrial fibrillation (HCC)    TMJ (dislocation of temporomandibular joint)     PAST SURGICAL HISTORY: Past Surgical History:  Procedure Laterality Date   AUGMENTATION MAMMAPLASTY Bilateral 1998   BREAST BIOPSY Bilateral 2001-2003   RIGHT BREAST BIOPSY 2001-PAPILOMA/LT BR BXY-BENIGN 2003   CERVIX LESION DESTRUCTION  ~ 1993   CLAVICLE  HARDWARE REMOVAL Right ~ 2008   EYE SURGERY     FRACTURE SURGERY     HYSTEROSCOPY N/A 07/27/2013   Procedure: HYSTEROSCOPY WITH HYDROTHERMAL ABLATION;  Surgeon: Ok Edwards, MD;  Location: [facility];  Service: Gynecology;  Laterality: N/A;   LAPAROSCOPIC CHOLECYSTECTOMY  2011   ORIF CLAVICLE FRACTURE Right ~ 2008   CLAVICLE FRACTURE-TRAUMATIC   REFRACTIVE SURGERY Bilateral 1990s   SHOULDER ARTHROSCOPY Right 2016   "labrium cartilage repair"   SHOULDER ARTHROSCOPY WITH ROTATOR CUFF REPAIR Right 2000   TRIGGER FINGER RELEASE Right    thumb   tube in ear Left     FAMILY HISTORY: Family History  Problem Relation Age of Onset   Breast cancer Mother 69   Hypertension Father    Breast cancer Maternal Aunt 78   Atrial fibrillation Brother     SOCIAL HISTORY: Social History   Socioeconomic History   Marital status: Married    Spouse name: Not on file   Number of children: Not on file   Years of education: Not on file   Highest education level: Not on file  Occupational History   Not on file  Tobacco Use   Smoking status: Never    Passive exposure: Past   Smokeless tobacco: Never  Vaping Use   Vaping Use: Never used  Substance and Sexual Activity   Alcohol use: Yes    Comment: couple times a month   Drug use: No   Sexual activity: Yes    Partners: Male    Birth control/protection: Other-see comments    Comment: 1st intercourse- 18, partners- 5, husband vasectomy  Other Topics Concern   Not on file  Social History Narrative   Not on file   Social Determinants of Health   Financial Resource Strain: Not on file  Food Insecurity: Not on file  Transportation Needs: Not on file  Physical Activity: Not on file  Stress: Not on file  Social Connections: Not on file  Intimate Partner Violence: Not on file     PHYSICAL EXAM  GENERAL EXAM/CONSTITUTIONAL: Vitals:  Vitals:   06/24/22 1513  BP: 113/70  Pulse: 69  Weight: 154 lb (69.9 kg)  Height: 5' 3.5" (1.613  m)   Body mass index is 26.85 kg/m. Wt Readings from Last 3 Encounters:  06/24/22 154 lb (69.9 kg)  06/18/22 154 lb (69.9 kg)  05/14/22 157 lb 4 oz (71.3 kg)   Patient is in no distress; well developed, nourished and groomed; neck is supple  CARDIOVASCULAR: Examination of carotid arteries is normal; no carotid bruits Regular rate and rhythm, no murmurs Examination of peripheral vascular system by observation and palpation is normal  EYES: Ophthalmoscopic exam of optic discs and posterior segments is normal; no papilledema or hemorrhages No results found.  MUSCULOSKELETAL: Gait, strength, tone, movements noted in Neurologic exam below  NEUROLOGIC: MENTAL STATUS:     06/24/2022  3:17 PM  MMSE - Mini Mental State Exam  Orientation to time 5  Orientation to Place 5  Registration 3  Attention/ Calculation 5  Recall 3  Language- name 2 objects 2  Language- repeat 1  Language- follow 3 step command 3  Language- read & follow direction 1  Write a sentence 1  Copy design 1  Total score 30   awake, alert, oriented to person, place and time recent and remote memory intact normal attention and concentration language fluent, comprehension intact, naming intact fund of knowledge appropriate  CRANIAL NERVE:  2nd - no papilledema on fundoscopic exam 2nd, 3rd, 4th, 6th - pupils equal and reactive to light, visual fields full to confrontation, extraocular muscles intact, no nystagmus 5th - facial sensation symmetric 7th - facial strength symmetric 8th - hearing intact 9th - palate elevates symmetrically, uvula midline 11th - shoulder shrug symmetric 12th - tongue protrusion midline  MOTOR:  normal bulk and tone, full strength in the BUE, BLE  SENSORY:  normal and symmetric to light touch, temperature, vibration  COORDINATION:  finger-nose-finger, fine finger movements normal  REFLEXES:  deep tendon reflexes present and symmetric  GAIT/STATION:  narrow based  gait;romberg negative     DIAGNOSTIC DATA (LABS, IMAGING, TESTING) - I reviewed patient records, labs, notes, testing and imaging myself where available.  Lab Results  Component Value Date   WBC 4.8 08/13/2021   HGB 12.6 08/13/2021   HCT 37.1 08/13/2021   MCV 91.6 08/13/2021   PLT 261.0 08/13/2021      Component Value Date/Time   NA 138 07/17/2021 0923   K 4.9 07/17/2021 0923   CL 98 07/17/2021 0923   CO2 31 07/17/2021 0923   GLUCOSE 86 07/17/2021 0923   BUN 18 07/17/2021 0923   CREATININE 1.08 07/17/2021 0923   CREATININE 0.97 08/22/2015 0841   CALCIUM 9.7 07/17/2021 0923   PROT 6.9 07/24/2021 1311   ALBUMIN 4.1 07/24/2021 1311   AST 24 07/24/2021 1311   ALT 32 07/24/2021 1311   ALKPHOS 76 07/24/2021 1311   BILITOT 0.5 07/24/2021 1311   GFRNONAA >60 12/19/2015 0155   GFRAA >60 12/19/2015 0155   Lab Results  Component Value Date   CHOL 226 (H) 07/24/2021   HDL 83.60 07/24/2021   LDLCALC 128 (H) 07/24/2021   TRIG 72.0 07/24/2021   CHOLHDL 3 07/24/2021   No results found for: "HGBA1C" Lab Results  Component Value Date   VITAMINB12 590 07/17/2021   Lab Results  Component Value Date   TSH 1.33 07/17/2021      ASSESSMENT AND PLAN  57 y.o. year old female here with:   Dx:  1. Memory loss   2. Attention deficit     PLAN:  MEMORY LOSS / DECREASED ATTENTION / BALANCE ISSUES - check MRI brain (rule out autoimmune, inflamm, demyelinating dz) - check neuropsychology testing - continue attention deficit treatments per PCP (or consider psychiatry / psychology) - safety / supervision issues reviewed - daily physical activity / exercise (at least 15-30 minutes) - eat more plants / vegetables - increase social activities, brain stimulation, games, puzzles, hobbies, crafts, arts, music - aim for at least 7-8 hours sleep per night (or more) - avoid smoking and alcohol  Orders Placed This Encounter  Procedures   MR BRAIN W WO CONTRAST   Ambulatory  referral to Neuropsychology   Return for pending if symptoms worsen or fail to improve, pending test results.    Suanne Marker, MD 06/24/2022,  4:03 PM Certified in Neurology, Neurophysiology and Neuroimaging  [facility] [address] [place], [place] [address] [phone]

## 2022-06-24 NOTE — Patient Instructions (Signed)
MEMORY LOSS / DECREASED ATTENTION - check MRI brain, neuropsychology testing - safety / supervision issues reviewed - daily physical activity / exercise (at least 15-30 minutes) - eat more plants / vegetables - increase social activities, brain stimulation, games, puzzles, hobbies, crafts, arts, music - aim for at least 7-8 hours sleep per night (or more) - avoid smoking and alcohol

## 2022-06-24 NOTE — Telephone Encounter (Signed)
Referral faxed to Dr. Clayborn Heron Office: [phone]  Fax: [phone]

## 2022-06-25 ENCOUNTER — Ambulatory Visit: Payer: 59

## 2022-06-25 LAB — CYTOLOGY - PAP: Diagnosis: NEGATIVE

## 2022-07-07 ENCOUNTER — Telehealth: Payer: Self-pay | Admitting: Diagnostic Neuroimaging

## 2022-07-07 NOTE — Telephone Encounter (Signed)
[employer] is requiring a peer to peer for her MRI. You can call [phone] and the case #[id number]  I will place the information packet in the pod with appeal options if needed.

## 2022-07-07 NOTE — Telephone Encounter (Signed)
Denial placed on MD desk for review

## 2022-07-12 NOTE — Telephone Encounter (Signed)
P2P scheduled 6/11 8AM with Dr Maryann Alar.

## 2022-07-20 ENCOUNTER — Other Ambulatory Visit: Payer: Self-pay | Admitting: Internal Medicine

## 2022-07-22 ENCOUNTER — Ambulatory Visit: Payer: 59

## 2022-08-06 ENCOUNTER — Ambulatory Visit: Payer: 59 | Admitting: Internal Medicine

## 2022-08-12 ENCOUNTER — Other Ambulatory Visit: Payer: Self-pay | Admitting: Internal Medicine

## 2022-08-12 ENCOUNTER — Ambulatory Visit
Admission: RE | Admit: 2022-08-12 | Discharge: 2022-08-12 | Disposition: A | Discharge status: Home / Self Care | Payer: 59 | Source: Ambulatory Visit | Attending: Family Medicine | Admitting: Family Medicine

## 2022-08-12 DIAGNOSIS — Z1231 Encounter for screening mammogram for malignant neoplasm of breast: Secondary | ICD-10-CM

## 2022-08-16 ENCOUNTER — Other Ambulatory Visit: Payer: Self-pay | Admitting: Internal Medicine

## 2022-08-19 ENCOUNTER — Ambulatory Visit (INDEPENDENT_AMBULATORY_CARE_PROVIDER_SITE_OTHER): Payer: 59 | Admitting: Internal Medicine

## 2022-08-19 ENCOUNTER — Encounter: Payer: Self-pay | Admitting: Internal Medicine

## 2022-08-19 VITALS — BP 118/72 | HR 78 | Temp 97.7°F | Ht 63.5 in | Wt 160.0 lb

## 2022-08-19 DIAGNOSIS — J45991 Cough variant asthma: Secondary | ICD-10-CM

## 2022-08-19 NOTE — Patient Instructions (Signed)
No change in medications   Please schedule a follow up visit in 12  months but call sooner if needed  

## 2022-08-19 NOTE — Assessment & Plan Note (Addendum)
Onset Jan 2020 acute but required saba short term, recurred late may 2023 - 08/13/2021  After extensive coaching inhaler device,  effectiveness =    90%  -  Allergy screen 08/13/2021 >  Eos 0.1 /  IgE  7 - FENO  09/24/2021   = 17 on symbicort 80 2bid > ok to taper as tol  - 03/22/2022 rec trial of air supra   All goals of chronic asthma control met including optimal function and elimination of symptoms with minimal need for rescue therapy.  Contingencies discussed in full including contacting this office immediately if not controlling the symptoms using the rule of two's.     F/u can be yearly, sooner prn          Each maintenance medication was reviewed in detail including emphasizing most importantly the difference between maintenance and prns and under what circumstances the prns are to be triggered using an action plan format where appropriate.  Total time for H and P, chart review, counseling, reviewing hfa  device(s) and generating customized AVS unique to this office visit / same day charting = 22 min

## 2022-08-19 NOTE — Progress Notes (Signed)
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 Albuterol neg covid x one and neg flu    History of Present Illness  07/06/2021  Pulmonary/ 1st office eval/Embry Manrique  Chief Complaint  Patient presents with   Pulmonary Consult    Self referral for cough. She c/o cough x 2 wks- occ prod with clear sputum. She just completed Zpack today and currently on pred taper.   Dyspnea:  feels shallow Cough: not productive  Sleep: coughing at hs  SABA use: not on day of day  Overt hb since onset of cough was not a problem prior Gen cp / tightness during severe coughing fits  Rec Finish the prednsione  [employer]  Take 2 puffs first thing in am and then another 2 puffs about 12 hours later.  Work on inhaler technique:   Only use your albuterol as a rescue medication  Pantoprazole (protonix) 40 mg   Take  30-60 min before first meal of the day and Pepcid (famotidine)  20 mg after supper until return to office  GERD diet Tussionex 1tsp up to every 4 -6 hours until no longer coughing x 48 hours  then as needed     03/22/2022  f/u ov/Sherri Levenhagen re: cough variant cough maint on prn symb 80 /not using much saba   Chief Complaint  Patient presents with   Follow-up    Pt thinks she had COVID around 03/04/22.  Easily fatigued.  Pt feels like she is breathing "heavy"  Dyspnea:  power walks on treadmill x 20 min x one mile no elevation / fatigue main problem  Cough: some throat clearing daytime only  Sleeping: flat bed / 2 pillows  SABA use: every other day  albuterol 02: none  Covid status:   x 2/ infected x 3  Rec Air supra is up to 2 puffs every 4 hours as needed  Pantoprazole (protonix) 40 mg   Take  30-60 min before first meal of the day and Pepcid (famotidine)  20 mg after supper until no cough for a week   08/19/2022  f/u ov/Cassadie Pankonin re: cough variant asthma with uacs component  maint on airsupra up to 4puff per day  Chief Complaint  Patient presents with   Follow-up    Sinus infection about one month ago.  Rx abx. Improved now.  Dyspnea:  rowing machine x 20 min  3 x weekly  Cough: some worse in heat but non productive  Sleeping: no cough  SABA use: as above  02: none  Rx'd sinus infection with augmentin and completely cleared symptoms      No obvious day to day or daytime variability or assoc excess/ purulent sputum or mucus plugs or hemoptysis or cp or chest tightness, subjective wheeze or overt   hb symptoms.   Sleeping fine  without nocturnal  or early am exacerbation  of respiratory  c/o's or need for noct saba. Also denies any obvious fluctuation of symptoms with  weather or environmental changes or other aggravating or alleviating factors except as outlined above   No unusual exposure hx or h/o childhood pna/ asthma or knowledge of premature birth.  Current Allergies, Complete Past Medical History, Past Surgical History, Family History, and Social History were reviewed in [employer] record.  ROS  The following are not active complaints unless bolded Hoarseness, sore throat, dysphagia, dental problems, itching, sneezing,  nasal congestion or discharge of excess mucus or purulent secretions, ear ache,   fever, chills, sweats, unintended wt loss or wt gain, classically pleuritic or exertional cp,  orthopnea pnd or arm/hand swelling  or leg swelling, presyncope, palpitations, abdominal pain, anorexia, nausea, vomiting, diarrhea  or change in bowel habits or change in bladder habits, change in stools or change  in urine, dysuria, hematuria,  rash, arthralgias, visual complaints, headache, numbness, weakness or ataxia or problems with walking or coordination,  change in mood or  memory.        Current Meds  Medication Sig   Albuterol-Budesonide (AIRSUPRA) 90-80 MCG/ACT AERO Inhale 10.7 g into the lungs every 4 (four) hours as needed.   amphetamine-dextroamphetamine (ADDERALL XR) 20 MG 24 hr capsule Take 1 capsule (20 mg total) by mouth every morning.   amphetamine-dextroamphetamine (ADDERALL) 20 MG tablet Take 1 tablet (20 mg total) by mouth daily. In the afternoon for additional focus   Estradiol 10 MCG TABS vaginal tablet Place 1 tablet (10 mcg total) vaginally 2 (two) times a week.   famotidine (PEPCID) 20 MG tablet TAKE 1 TABLET BY MOUTH AFTER SUPPER   flecainide (TAMBOCOR) 50 MG tablet TAKE 1 TABLET BY MOUTH TWICE A DAY   metoprolol succinate (TOPROL-XL) 25 MG 24 hr tablet TAKE 1/2 TABLET BY MOUTH EVERY DAY   Multiple Vitamin (MULTIVITAMIN) tablet Take 1 tablet by mouth daily.   pantoprazole (PROTONIX) 40 MG tablet TAKE 1 TABLET BY MOUTH 30 TO 60 MINUTES BEFORE FIRST MEALS   PARoxetine (PAXIL-CR) 25 MG 24 hr tablet TAKE 1 TABLET (25 MG TOTAL) BY MOUTH DAILY.   traZODone (DESYREL) 50 MG tablet TAKE 0.5-1 TABLETS BY MOUTH AT BEDTIME AS NEEDED FOR SLEEP.                 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    NEGATIVE MUTATION 01/05/06   Depression    Dysplasia of cervix, unspecified    Herpes    Hyperprolactinemia (HCC)    Insomnia    Migraine    "2-3/year" (12/18/2015)   Paroxysmal atrial fibrillation (HCC)    TMJ (dislocation of temporomandibular joint)       Objective:    Wts  08/19/2022       160  03/22/2022       161   09/24/2021      159    08/13/21 162 lb (73.5 kg)  07/30/21 160 lb 12.8 oz (72.9 kg)  07/24/21 160 lb (72.6 kg)      Vital signs reviewed  08/19/2022  - Note at rest 02 sats   96% on RA   General appearance:    healthy appearing am wf nad     HEENT : Oropharynx  clear      Nasal turbinates nl    NECK :  without  apparent JVD/ palpable Nodes/TM    LUNGS: no acc muscle use,  Nl contour chest which is clear to  A and P bilaterally without cough on insp or exp maneuvers   CV:  RRR  no s3 or murmur or increase in P2, and no edema    MS:   warm without deformities Or obvious joint restrictions  calf tenderness, cyanosis or clubbing     NEURO:  alert, approp, nl sensorium with  no motor or cerebellar deficits apparent.              Assessment

## 2022-08-27 ENCOUNTER — Other Ambulatory Visit: Payer: 59

## 2022-09-01 ENCOUNTER — Encounter: Payer: Self-pay | Admitting: Family Medicine

## 2022-09-02 ENCOUNTER — Other Ambulatory Visit: Payer: 59

## 2022-09-06 ENCOUNTER — Other Ambulatory Visit: Payer: Self-pay

## 2022-09-06 MED ORDER — AMPHETAMINE-DEXTROAMPHETAMINE 20 MG PO TABS: 20.0000 mg | ORAL_TABLET | Freq: Every day | ORAL | 0 refills | Status: DC

## 2022-09-06 NOTE — Telephone Encounter (Signed)
Adderall 20 mg  [facility]  LOV: 05/14/22 Last Refill:06/11/22 Upcoming appt: none

## 2022-09-07 ENCOUNTER — Encounter: Payer: Self-pay | Admitting: Family Medicine

## 2022-09-07 ENCOUNTER — Ambulatory Visit
Admission: RE | Admit: 2022-09-07 | Discharge: 2022-09-07 | Disposition: A | Discharge status: Home / Self Care | Payer: 59 | Source: Ambulatory Visit | Attending: Diagnostic Neuroimaging | Admitting: Diagnostic Neuroimaging

## 2022-09-07 DIAGNOSIS — R413 Other amnesia: Secondary | ICD-10-CM | POA: Diagnosis not present

## 2022-09-07 MED ORDER — GADOPICLENOL 0.5 MMOL/ML IV SOLN
7.5000 mL | Freq: Once | INTRAVENOUS | Status: AC | PRN
  Administered 2022-09-07: 7.5 mL via INTRAVENOUS

## 2022-09-10 ENCOUNTER — Other Ambulatory Visit: Payer: 59

## 2022-11-09 ENCOUNTER — Other Ambulatory Visit: Payer: Self-pay | Admitting: Family Medicine

## 2022-11-09 DIAGNOSIS — F900 Attention-deficit hyperactivity disorder, predominantly inattentive type: Secondary | ICD-10-CM

## 2022-11-09 MED ORDER — AMPHETAMINE-DEXTROAMPHETAMINE 20 MG PO TABS: 20.0000 mg | ORAL_TABLET | Freq: Every day | ORAL | 0 refills | Status: DC

## 2022-11-09 NOTE — Telephone Encounter (Signed)
Patient is requesting a refill of the following medications: Requested Prescriptions   Pending Prescriptions Disp Refills   amphetamine-dextroamphetamine (ADDERALL) 20 MG tablet 30 tablet 0    Sig: Take 1 tablet (20 mg total) by mouth daily. In the afternoon for additional focus    Date of patient request: 11/09/22 Last office visit: 05/14/22 Date of last refill: 06/11/22 Last refill amount: 30 Follow up time period per chart: 3 months

## 2022-11-16 ENCOUNTER — Encounter: Payer: Self-pay | Admitting: Family Medicine

## 2022-11-17 MED ORDER — LISDEXAMFETAMINE DIMESYLATE 30 MG PO CAPS: 30.0000 mg | ORAL_CAPSULE | Freq: Every day | ORAL | 0 refills | Status: DC

## 2022-11-19 ENCOUNTER — Other Ambulatory Visit: Payer: Self-pay

## 2022-11-19 DIAGNOSIS — I9589 Other hypotension: Secondary | ICD-10-CM

## 2022-11-19 MED ORDER — TRAZODONE HCL 50 MG PO TABS: 50.0000 mg | ORAL_TABLET | Freq: Every day | ORAL | 2 refills | Status: DC

## 2022-11-19 MED ORDER — PAROXETINE HCL ER 25 MG PO TB24: 25.0000 mg | ORAL_TABLET | Freq: Every day | ORAL | 3 refills | Status: DC

## 2022-11-19 NOTE — Telephone Encounter (Signed)
Patient called in and states trying to transfer meds to new preferred pharmacy and states we need to Call [phone] to get medications transferred to the Mail order through [place] [facility]   Pended meds below let me know if appropriate and can call them

## 2022-11-23 ENCOUNTER — Other Ambulatory Visit: Payer: Self-pay | Admitting: Physician Assistant

## 2022-12-31 ENCOUNTER — Other Ambulatory Visit: Payer: Self-pay | Admitting: Family Medicine

## 2023-01-03 MED ORDER — LISDEXAMFETAMINE DIMESYLATE 30 MG PO CAPS: 30.0000 mg | ORAL_CAPSULE | Freq: Every day | ORAL | 0 refills | Status: DC

## 2023-01-03 NOTE — Telephone Encounter (Signed)
Requested Prescriptions   Pending Prescriptions Disp Refills   lisdexamfetamine (VYVANSE) 30 MG capsule 30 capsule 0    Sig: Take 1 capsule (30 mg total) by mouth daily.     Date of patient request: 01/03/2023 Last office visit: 05/14/2022 Upcoming visit: Visit date not found Date of last refill: 11/17/2022 Last refill amount: #30 0 refills  Patient is overdue for a follow up. I have sent a message to the admin pool to get her scheduled. Wanted to see if you wanted to send in a 30 day supply.

## 2023-01-24 ENCOUNTER — Other Ambulatory Visit: Payer: Self-pay | Admitting: Internal Medicine

## 2023-03-11 ENCOUNTER — Other Ambulatory Visit: Payer: Self-pay | Admitting: Family Medicine

## 2023-03-14 MED ORDER — LISDEXAMFETAMINE DIMESYLATE 30 MG PO CAPS: 30.0000 mg | ORAL_CAPSULE | Freq: Every day | ORAL | 0 refills | Status: DC

## 2023-03-14 NOTE — Telephone Encounter (Signed)
 Requested Prescriptions   Pending Prescriptions Disp Refills   lisdexamfetamine (VYVANSE ) 30 MG capsule 30 capsule 0    Sig: Take 1 capsule (30 mg total) by mouth daily.     Date of patient request:03/14/2023  Last office visit: 05/14/2022 Upcoming visit: Visit date not found Date of last refill: 01/03/2023 Last refill amount: 30

## 2023-03-22 ENCOUNTER — Ambulatory Visit: Payer: 59 | Admitting: Family Medicine

## 2023-03-22 ENCOUNTER — Encounter: Payer: Self-pay | Admitting: Family Medicine

## 2023-03-22 ENCOUNTER — Telehealth: Payer: Self-pay

## 2023-03-22 VITALS — BP 122/78 | HR 88 | Temp 98.3°F | Ht 63.0 in | Wt 133.2 lb

## 2023-03-22 DIAGNOSIS — R6889 Other general symptoms and signs: Secondary | ICD-10-CM | POA: Diagnosis not present

## 2023-03-22 DIAGNOSIS — Z20828 Contact with and (suspected) exposure to other viral communicable diseases: Secondary | ICD-10-CM | POA: Diagnosis not present

## 2023-03-22 DIAGNOSIS — R112 Nausea with vomiting, unspecified: Secondary | ICD-10-CM

## 2023-03-22 LAB — POCT INFLUENZA A/B
Influenza A, POC: NEGATIVE
Influenza B, POC: NEGATIVE

## 2023-03-22 MED ORDER — OSELTAMIVIR PHOSPHATE 75 MG PO CAPS: 75.0000 mg | ORAL_CAPSULE | Freq: Two times a day (BID) | ORAL | 0 refills | Status: AC

## 2023-03-22 MED ORDER — ONDANSETRON 4 MG PO TBDP
4.0000 mg | ORAL_TABLET | Freq: Three times a day (TID) | ORAL | 0 refills | Status: DC | PRN
Start: 2023-03-22 — End: 2023-06-10

## 2023-03-22 NOTE — Telephone Encounter (Signed)
Copied from CRM [id number]. Topic: Clinical - Medical Advice >> Mar 22, 2023 12:29 PM Shelbie Proctor wrote: Reason for CRM: Patient [phone] was just seen today with NP, Saunders Glance and was advised to take vitamins but does not know the dosage of the Vitamin C, Vitamin D, and Zinc to take. Patient tried to look on MyChart but the information is not there. Please advise and call back.

## 2023-03-22 NOTE — Patient Instructions (Addendum)
-  It was a pleasure taking care of you today and hope you get to feeling better soon. -Influenza is negative. -Unable to test for covid since test are not available in office. However, recommend to home test for covid. If positive, please call back to the office or send a MyChart message.  -Since you were exposed to flu last week and having similar symptoms of the flu. Will go ahead with treatment. We may have tested to soon for the viral load to show on the test with symptoms just starting this morning. Prescribed Tamiflu 75mg  tablet, take 1 tablet twice a day for 5 days. -Prescribed Zofran 4mg  ODT, take 1 tablet every 8 hours as needed nausea or vomiting.   -Work note provided for you on MyChart to print off. You may return back to work on Monday, 03/28/2023.  -You may take over the counter Vitamin D3 1,000IU daily, Vitamin C 1,000mg ; and Zinc 50mg  daily for 30 days to support your immune system. -Follow up if not improved.

## 2023-03-22 NOTE — Progress Notes (Addendum)
Acute Office Visit   Subjective:  Patient ID: Jenna Cortez, female    DOB: 1965-10-12, 58 y.o.   MRN: [id number]  Chief Complaint  Patient presents with   Nausea    Nausea/vomitting, chills body aches fatigue     HPI Patient is present for an acute visit. She is experiencing nausea, vomiting, diarrhea, chills, body aches, SHOB only after vomiting, fatigue, and ears feeling clogged.   Uncertain about fever. Denies chest pain, cough, sinus congestion/drainage, or sore throat.   Symptoms started this morning when waking up. Initially, thought it was her GERD symptoms flaring up. She took Pantoprazole 40mg  this morning with no relief.   Patients son had the flu last week.  Denies testing for flu or covid at home.   Since the first of the year, patient reports she will have some of the same symptoms about every 2 weeks.  ROS See HPI above      Objective:   BP 122/78 (BP Location: Right Arm, Patient Position: Sitting, Cuff Size: Normal)   Pulse 88   Temp 98.3 F (36.8 C) (Oral)   Ht 5\' 3"  (1.6 m)   Wt 133 lb 3.2 oz (60.4 kg)   SpO2 99%   BMI 23.60 kg/m    Physical Exam Vitals reviewed.  Constitutional:      General: She is not in acute distress.    Appearance: Normal appearance. She is ill-appearing (Mild). She is not toxic-appearing or diaphoretic.  HENT:     Head: Normocephalic and atraumatic.     Right Ear: Tympanic membrane, ear canal and external ear normal. There is no impacted cerumen.     Left Ear: Tympanic membrane, ear canal and external ear normal. There is no impacted cerumen.     Nose:     Right Sinus: No maxillary sinus tenderness or frontal sinus tenderness.     Left Sinus: No maxillary sinus tenderness or frontal sinus tenderness.     Mouth/Throat:     Pharynx: Oropharynx is clear. Uvula midline. No pharyngeal swelling, oropharyngeal exudate, posterior oropharyngeal erythema or uvula swelling.  Eyes:     General:        Right eye: No discharge.         Left eye: No discharge.     Conjunctiva/sclera: Conjunctivae normal.  Cardiovascular:     Rate and Rhythm: Normal rate and regular rhythm.     Heart sounds: Normal heart sounds. No murmur heard.    No friction rub. No gallop.  Pulmonary:     Effort: Pulmonary effort is normal. No respiratory distress.     Breath sounds: Normal breath sounds.  Musculoskeletal:        General: Normal range of motion.  Skin:    General: Skin is warm and dry.  Neurological:     General: No focal deficit present.     Mental Status: She is alert and oriented to person, place, and time. Mental status is at baseline.  Psychiatric:        Mood and Affect: Mood normal.        Behavior: Behavior normal.        Thought Content: Thought content normal.        Judgment: Judgment normal.     Results for orders placed or performed in visit on 03/22/23  POCT Influenza A/B  Result Value Ref Range   Influenza A, POC Negative Negative   Influenza B, POC Negative Negative      Assessment &  Plan:  Flu-like symptoms -     POCT Influenza A/B  Exposure to influenza -     Oseltamivir Phosphate; Take 1 capsule (75 mg total) by mouth 2 (two) times daily for 5 days.  Dispense: 10 capsule; Refill: 0  Nausea  Nausea and vomiting, unspecified vomiting type -     Ondansetron; Take 1 tablet (4 mg total) by mouth every 8 (eight) hours as needed for nausea or vomiting.  Dispense: 20 tablet; Refill: 0  -Influenza is negative. -Unable to test for covid since test are not available in office. However, recommend to home test for covid. If positive, please call back to the office or send a MyChart message.  -Since she was exposed to flu last week and having similar symptoms of the flu. Will go ahead with treatment. We may have tested to soon for the viral load to show on the test with symptoms just starting this morning. Prescribed Tamiflu 75mg  tablet, take 1 tablet twice a day for 5 days. -Prescribed Zofran 4mg  ODT, take 1  tablet every 8 hours as needed nausea or vomiting.   -Work note provided for you on MyChart to print off. She may return back to work on Monday, 03/28/2023.  -Advised she may take over the counter Vitamin D3 1,000IU daily, Vitamin C 1,000mg ; and Zinc 50mg  daily for 30 days to support your immune system. -Follow up if not improved.   Zandra Abts, NP

## 2023-05-02 ENCOUNTER — Other Ambulatory Visit (HOSPITAL_COMMUNITY): Payer: Self-pay | Admitting: Physician Assistant

## 2023-05-17 ENCOUNTER — Other Ambulatory Visit (HOSPITAL_COMMUNITY): Payer: Self-pay

## 2023-05-17 ENCOUNTER — Other Ambulatory Visit: Payer: Self-pay | Admitting: Family Medicine

## 2023-05-17 MED ORDER — METOPROLOL SUCCINATE ER 25 MG PO TB24: 12.5000 mg | ORAL_TABLET | Freq: Every day | ORAL | 0 refills | Status: DC

## 2023-05-18 NOTE — Telephone Encounter (Signed)
 Requested Prescriptions   Pending Prescriptions Disp Refills   lisdexamfetamine (VYVANSE) 30 MG capsule 30 capsule 0    Sig: Take 1 capsule (30 mg total) by mouth daily.     Date of patient request: 05/18/2023 Last office visit: Visit date not found Upcoming visit: Visit date not found Date of last refill: 03/14/2023 Last refill amount: 30

## 2023-05-19 ENCOUNTER — Telehealth (HOSPITAL_COMMUNITY): Payer: Self-pay

## 2023-05-19 NOTE — Telephone Encounter (Signed)
 I have filled her Metoprolol and have given her enough until she sees Areatha Ku. For further refills she will need to contact Areatha Ku.

## 2023-05-23 MED ORDER — LISDEXAMFETAMINE DIMESYLATE 30 MG PO CAPS: 30.0000 mg | ORAL_CAPSULE | Freq: Every day | ORAL | 0 refills | Status: DC

## 2023-06-09 NOTE — Progress Notes (Signed)
  Electrophysiology Office Note:   Date:  06/10/2023  ID:  Jenna Cortez, DOB [birth year], MRN [id number]  Primary Cardiologist: None Electrophysiologist: Ardeen Kohler, MD       History of Present Illness:   Jenna Cortez is a 58 y.o. female with h/o paroxysmal AF seen today for routine electrophysiology followup.   Since last being seen in our clinic the patient reports doing well. Overall,  she denies chest pain, palpitations, dyspnea, PND, orthopnea, nausea, vomiting, dizziness, syncope, edema, weight gain, or early satiety.   Review of systems complete and found to be negative unless listed in HPI.   EP Information / Studies Reviewed:    EKG is ordered today. Personal review as below.  EKG Interpretation Date/Time:  Friday Jun 10 2023 08:45:38 EDT Ventricular Rate:  67 PR Interval:  150 QRS Duration:  78 QT Interval:  384 QTC Calculation: 405 R Axis:   70  Text Interpretation: Normal sinus rhythm with sinus arrhythmia Septal infarct (cited on or before 19-Dec-2015) When compared with ECG of 29-Jan-2022 09:34, No significant change was found Confirmed by Pilar Bridge [phone]) on 06/10/2023 8:55:51 AM    Arrhythmia/Device History No specialty comments available.   Physical Exam:   VS:  BP 124/80   Pulse 67   Ht 5\' 3"  (1.6 m)   Wt 146 lb 6.4 oz (66.4 kg)   SpO2 99%   BMI 25.93 kg/m    Wt Readings from Last 3 Encounters:  06/10/23 146 lb 6.4 oz (66.4 kg)  03/22/23 133 lb 3.2 oz (60.4 kg)  08/19/22 160 lb (72.6 kg)     GEN: No acute distress NECK: No JVD; No carotid bruits CARDIAC: Regular rate and rhythm, no murmurs, rubs, gallops RESPIRATORY:  Clear to auscultation without rales, wheezing or rhonchi  ABDOMEN: Soft, non-tender, non-distended EXTREMITIES:  No edema; No deformity   ASSESSMENT AND PLAN:    Paroxysmal AF EKG today shows NSR with stable intervals Continue flecainide  50 mg BID Continue toprol  12.5 mg daily She would be excellent candidate for  ablation if has more AF in the future.  Not on OAC with CHA2DS2VASc of only 1.   Will assign to Dr. Daneil Dunker from Dr. Nunzio Belch.   Follow up with EP APP in 6 months for flecainide  monitoring  Signed, Tylene Galla, PA-C

## 2023-06-10 ENCOUNTER — Encounter: Payer: Self-pay | Admitting: Student

## 2023-06-10 ENCOUNTER — Ambulatory Visit: Attending: Student | Admitting: Student

## 2023-06-10 VITALS — BP 124/80 | HR 67 | Ht 63.0 in | Wt 146.4 lb

## 2023-06-10 DIAGNOSIS — I480 Paroxysmal atrial fibrillation: Secondary | ICD-10-CM | POA: Diagnosis not present

## 2023-06-10 NOTE — Addendum Note (Signed)
 Addended by: Pilar Bridge A on: 06/10/2023 09:08 AM   Modules accepted: Level of Service

## 2023-06-10 NOTE — Patient Instructions (Signed)
 Medication Instructions:  Your physician recommends that you continue on your current medications as directed. Please refer to the Current Medication list given to you today.  *If you need a refill on your cardiac medications before your next appointment, please call your pharmacy*  Lab Work: None ordered If you have labs (blood work) drawn today and your tests are completely normal, you will receive your results only by: MyChart Message (if you have MyChart) OR A paper copy in the mail If you have any lab test that is abnormal or we need to change your treatment, we will call you to review the results.  Follow-Up: At [facility], you and your health needs are our priority.  As part of our continuing mission to provide you with exceptional heart care, our providers are all part of one team.  This team includes your primary Cardiologist (physician) and Advanced Practice Providers or APPs (Physician Assistants and Nurse Practitioners) who all work together to provide you with the care you need, when you need it.  Your next appointment:   6 month(s)  Provider:   You will see one of the following Advanced Practice Providers on your designated Care Team:   Mertha Abrahams, [place] [address]" [place], [place] Creighton Doffing, NP

## 2023-07-28 ENCOUNTER — Other Ambulatory Visit: Payer: Self-pay

## 2023-07-28 MED ORDER — FLECAINIDE ACETATE 50 MG PO TABS: 50.0000 mg | ORAL_TABLET | Freq: Two times a day (BID) | ORAL | 3 refills | Status: AC

## 2023-08-04 ENCOUNTER — Other Ambulatory Visit: Payer: Self-pay | Admitting: Family Medicine

## 2023-08-04 DIAGNOSIS — Z1231 Encounter for screening mammogram for malignant neoplasm of breast: Secondary | ICD-10-CM

## 2023-08-05 ENCOUNTER — Other Ambulatory Visit: Payer: Self-pay | Admitting: Medical Genetics

## 2023-08-12 ENCOUNTER — Ambulatory Visit: Admitting: Family Medicine

## 2023-08-18 ENCOUNTER — Other Ambulatory Visit (HOSPITAL_COMMUNITY): Payer: Self-pay | Admitting: Physician Assistant

## 2023-08-30 ENCOUNTER — Encounter: Payer: Self-pay | Admitting: Family Medicine

## 2023-08-30 MED ORDER — LISDEXAMFETAMINE DIMESYLATE 30 MG PO CAPS: 30.0000 mg | ORAL_CAPSULE | Freq: Every day | ORAL | 0 refills | Status: DC

## 2023-08-30 NOTE — Telephone Encounter (Signed)
 Requested Prescriptions   Pending Prescriptions Disp Refills   lisdexamfetamine (VYVANSE ) 30 MG capsule 30 capsule 0    Sig: Take 1 capsule (30 mg total) by mouth daily.     Date of patient request: 08/30/23 Last office visit: Visit date not found Upcoming visit: 09/16/2023 Date of last refill: 05/23/23 Last refill amount: 30 days

## 2023-09-02 ENCOUNTER — Ambulatory Visit

## 2023-09-16 ENCOUNTER — Ambulatory Visit: Admitting: Family Medicine

## 2023-09-23 ENCOUNTER — Ambulatory Visit
Admission: RE | Admit: 2023-09-23 | Discharge: 2023-09-23 | Disposition: A | Discharge status: Home / Self Care | Source: Ambulatory Visit | Attending: Family Medicine | Admitting: Family Medicine

## 2023-09-23 ENCOUNTER — Telehealth: Payer: Self-pay

## 2023-09-23 DIAGNOSIS — Z1231 Encounter for screening mammogram for malignant neoplasm of breast: Secondary | ICD-10-CM

## 2023-09-23 NOTE — Telephone Encounter (Signed)
 Type of form received: [facility]  Additional comments:   Received by: fax  Form should be Faxed/mailed to: [phone]  Is patient requesting call for pickup: No  Form placed:  In provider sign folder at POD A   Attach charge sheet.  Provider will determine charge.  Individual made aware of 3-5 business day turn around No?

## 2023-09-29 ENCOUNTER — Other Ambulatory Visit: Payer: Self-pay | Admitting: Family Medicine

## 2023-09-29 DIAGNOSIS — N644 Mastodynia: Secondary | ICD-10-CM

## 2023-09-29 NOTE — Telephone Encounter (Signed)
 Faxed and placed in scan bin

## 2023-09-29 NOTE — Telephone Encounter (Signed)
Form signed and returned to [place]

## 2023-10-07 ENCOUNTER — Ambulatory Visit
Admission: RE | Admit: 2023-10-07 | Discharge: 2023-10-07 | Disposition: A | Discharge status: Home / Self Care | Source: Ambulatory Visit | Attending: Family Medicine | Admitting: Family Medicine

## 2023-10-07 ENCOUNTER — Encounter: Payer: Self-pay | Admitting: Family Medicine

## 2023-10-07 ENCOUNTER — Ambulatory Visit: Admitting: Family Medicine

## 2023-10-07 VITALS — BP 110/70 | HR 78 | Temp 98.3°F | Resp 18 | Ht 60.25 in | Wt 151.6 lb

## 2023-10-07 DIAGNOSIS — N644 Mastodynia: Secondary | ICD-10-CM

## 2023-10-07 DIAGNOSIS — Z79890 Hormone replacement therapy (postmenopausal): Secondary | ICD-10-CM | POA: Diagnosis not present

## 2023-10-07 DIAGNOSIS — E663 Overweight: Secondary | ICD-10-CM

## 2023-10-07 LAB — CBC WITH DIFFERENTIAL/PLATELET
Basophils Absolute: 0 K/uL (ref 0.0–0.1)
Basophils Relative: 0.5 % (ref 0.0–3.0)
Eosinophils Absolute: 0.1 K/uL (ref 0.0–0.7)
Eosinophils Relative: 1.8 % (ref 0.0–5.0)
HCT: 41 % (ref 36.0–46.0)
Hemoglobin: 13.7 g/dL (ref 12.0–15.0)
Lymphocytes Relative: 22.7 % (ref 12.0–46.0)
Lymphs Abs: 1.2 K/uL (ref 0.7–4.0)
MCHC: 33.5 g/dL (ref 30.0–36.0)
MCV: 89.7 fl (ref 78.0–100.0)
Monocytes Absolute: 0.4 K/uL (ref 0.1–1.0)
Monocytes Relative: 6.8 % (ref 3.0–12.0)
Neutro Abs: 3.6 K/uL (ref 1.4–7.7)
Neutrophils Relative %: 68.2 % (ref 43.0–77.0)
Platelets: 262 K/uL (ref 150.0–400.0)
RBC: 4.57 Mil/uL (ref 3.87–5.11)
RDW: 13.6 % (ref 11.5–15.5)
WBC: 5.3 K/uL (ref 4.0–10.5)

## 2023-10-07 LAB — HEPATIC FUNCTION PANEL
ALT: 23 U/L (ref 0–35)
AST: 28 U/L (ref 0–37)
Albumin: 4.4 g/dL (ref 3.5–5.2)
Alkaline Phosphatase: 78 U/L (ref 39–117)
Bilirubin, Direct: 0.1 mg/dL (ref 0.0–0.3)
Total Bilirubin: 0.5 mg/dL (ref 0.2–1.2)
Total Protein: 7.3 g/dL (ref 6.0–8.3)

## 2023-10-07 LAB — BASIC METABOLIC PANEL WITH GFR
BUN: 26 mg/dL — ABNORMAL HIGH (ref 6–23)
CO2: 30 meq/L (ref 19–32)
Calcium: 10 mg/dL (ref 8.4–10.5)
Chloride: 100 meq/L (ref 96–112)
Creatinine, Ser: 0.99 mg/dL (ref 0.40–1.20)
GFR: 63.86 mL/min (ref >60.00)
Glucose, Bld: 82 mg/dL (ref 70–99)
Potassium: 4.6 meq/L (ref 3.5–5.1)
Sodium: 138 meq/L (ref 135–145)

## 2023-10-07 LAB — LIPID PANEL
Cholesterol: 198 mg/dL (ref 0–200)
HDL: 83.9 mg/dL (ref >39.00)
LDL Cholesterol: 98 mg/dL (ref 0–99)
NonHDL: 114.32
Total CHOL/HDL Ratio: 2
Triglycerides: 81 mg/dL (ref 0.0–149.0)
VLDL: 16.2 mg/dL (ref 0.0–40.0)

## 2023-10-07 LAB — TSH: TSH: 3.5 u[IU]/mL (ref 0.35–5.50)

## 2023-10-07 MED ORDER — ESTRADIOL 0.1 MG/GM VA CREA: 1.0000 | TOPICAL_CREAM | Freq: Every day | VAGINAL | 12 refills | Status: AC

## 2023-10-07 MED ORDER — ESTRADIOL 10 MCG VA TABS: ORAL_TABLET | VAGINAL | 1 refills | Status: DC

## 2023-10-07 NOTE — Assessment & Plan Note (Signed)
 Pt was previously on vaginal estrogen therapy but stopped when prescription ran out.  Has appt upcoming w/ GYN but not until later in October.  Having vaginal dryness, painful intercourse, and decreased libido.  Would like to restart vaginal estrogen.  Prescription sent to pharmacy.

## 2023-10-07 NOTE — Patient Instructions (Signed)
 Schedule your complete physical in 6 months We'll notify you of your lab results and make any changes if needed START the vaginal estrogen daily x2 weeks and then taper as able to 3x/week Continue to work on healthy diet and regular exercise- you look great! Call with any questions or concerns Stay Safe!  Stay Healthy! Have a great weekend!

## 2023-10-07 NOTE — Assessment & Plan Note (Signed)
 Ongoing issue.  She has gained 5 lbs since May.  BMI 26.85  She has not been exercising like she usually does b/c of increased stress recently w/ son's decision to forego college and join [employer].  Plans to focus more on diet and exercise.  Check labs to risk stratify.

## 2023-10-07 NOTE — Progress Notes (Signed)
   Subjective:    Patient ID: Jenna Cortez, female    DOB: [birth year], 58 y.o.   MRN: [id number]  HPI HRT- pt was previously on vaginal estrogen 10mcg twice weekly.  Stopped using 2 months ago when prescription ran out.  Pt feels that medication was helpful when she was using it.  Since stopping, has noted painful intercourse, decreased libido, vaginal dryness.    Overweight- pt has gained 5 lbs since May.  BMI now 26.85  Pt admits she is not exercising as much as she used to.  Has been under a lot of stress as son changed his mind about going to college and enrolled in the [employer].     Review of Systems For ROS see HPI     Objective:   Physical Exam Vitals reviewed.  Constitutional:      General: She is not in acute distress.    Appearance: Normal appearance. She is well-developed. She is not ill-appearing.  HENT:     Head: Normocephalic and atraumatic.  Eyes:     Conjunctiva/sclera: Conjunctivae normal.     Pupils: Pupils are equal, round, and reactive to light.  Neck:     Thyroid : No thyromegaly.  Cardiovascular:     Rate and Rhythm: Normal rate and regular rhythm.     Pulses: Normal pulses.     Heart sounds: Normal heart sounds. No murmur heard. Pulmonary:     Effort: Pulmonary effort is normal. No respiratory distress.     Breath sounds: Normal breath sounds.  Abdominal:     General: There is no distension.     Palpations: Abdomen is soft.     Tenderness: There is no abdominal tenderness.  Musculoskeletal:     Cervical back: Normal range of motion and neck supple.     Right lower leg: No edema.     Left lower leg: No edema.  Lymphadenopathy:     Cervical: No cervical adenopathy.  Skin:    General: Skin is warm and dry.  Neurological:     General: No focal deficit present.     Mental Status: She is alert and oriented to person, place, and time.  Psychiatric:        Mood and Affect: Mood normal.        Behavior: Behavior normal.        Thought Content: Thought  content normal.           Assessment & Plan:

## 2023-10-10 ENCOUNTER — Ambulatory Visit: Payer: Self-pay | Admitting: Family Medicine

## 2023-10-10 NOTE — Telephone Encounter (Signed)
 Copied from CRM [id number]. Topic: Clinical - Lab/Test Results >> Oct 10, 2023  1:19 PM Taleah C wrote: Reason for CRM: Pt called back to return call from [facility]. She stated that she was concerned about her results because it showed her bun was high. She asked for a call back.

## 2023-10-10 NOTE — Progress Notes (Signed)
 Called patient to replay lab results. Left vm to return call.

## 2023-10-10 NOTE — Telephone Encounter (Signed)
 Patient has concerns with Bun levels due to her results showing it was high

## 2023-10-14 ENCOUNTER — Encounter: Payer: Self-pay | Admitting: Family Medicine

## 2023-10-26 ENCOUNTER — Encounter: Payer: Self-pay | Admitting: Family Medicine

## 2023-10-27 MED ORDER — LISDEXAMFETAMINE DIMESYLATE 30 MG PO CAPS: 30.0000 mg | ORAL_CAPSULE | Freq: Every day | ORAL | 0 refills | Status: DC

## 2023-10-27 NOTE — Telephone Encounter (Signed)
 Dr. Mahlon can you please confirm how much patient should be using? She stated 1 applicator will not be enough to last her a week

## 2023-11-11 ENCOUNTER — Ambulatory Visit: Admitting: Radiology

## 2023-11-25 ENCOUNTER — Other Ambulatory Visit: Payer: Self-pay | Admitting: Medical Genetics

## 2023-11-25 DIAGNOSIS — Z006 Encounter for examination for normal comparison and control in clinical research program: Secondary | ICD-10-CM

## 2023-12-15 ENCOUNTER — Encounter: Payer: Self-pay | Admitting: Family Medicine

## 2023-12-16 MED ORDER — LISDEXAMFETAMINE DIMESYLATE 30 MG PO CAPS: 30.0000 mg | ORAL_CAPSULE | Freq: Every day | ORAL | 0 refills | Status: DC

## 2023-12-16 NOTE — Telephone Encounter (Signed)
 Requesting: Vyvanse  30mg  Contract: UDS:  Last Visit: 10/07/23 Next Visit: None Last Refill: 10/27/23 #30 and 0RF   Please Advise

## 2023-12-30 ENCOUNTER — Other Ambulatory Visit: Payer: Self-pay | Admitting: Family Medicine

## 2024-01-10 LAB — GENECONNECT MOLECULAR SCREEN: Genetic Analysis Overall Interpretation: NEGATIVE

## 2024-01-31 ENCOUNTER — Other Ambulatory Visit: Payer: Self-pay | Admitting: Family Medicine

## 2024-01-31 DIAGNOSIS — I9589 Other hypotension: Secondary | ICD-10-CM

## 2024-02-12 ENCOUNTER — Other Ambulatory Visit: Payer: Self-pay | Admitting: Family Medicine

## 2024-03-01 ENCOUNTER — Encounter: Payer: Self-pay | Admitting: Family Medicine

## 2024-03-01 MED ORDER — LISDEXAMFETAMINE DIMESYLATE 30 MG PO CAPS: 30.0000 mg | ORAL_CAPSULE | Freq: Every day | ORAL | 0 refills | Status: AC

## 2024-03-01 NOTE — Telephone Encounter (Signed)
 Requested Prescriptions   Pending Prescriptions Disp Refills   lisdexamfetamine  (VYVANSE ) 30 MG capsule 30 capsule 0    Sig: Take 1 capsule (30 mg total) by mouth daily.     Date of patient request: 03/01/2024 Last office visit: 10/07/2023 Upcoming visit: Visit date not found Date of last refill: 12/16/2023 Last refill amount: 30
